# Patient Record
Sex: Female | Born: 1935 | Race: Asian | Hispanic: No | Smoking: Never smoker
Health system: Southern US, Community
[De-identification: ages and names within clinical notes are randomized; demographics above are authoritative.]

## PROBLEM LIST (undated history)

## (undated) DIAGNOSIS — I10 Essential (primary) hypertension: Secondary | ICD-10-CM

## (undated) DIAGNOSIS — G5600 Carpal tunnel syndrome, unspecified upper limb: Secondary | ICD-10-CM

## (undated) DIAGNOSIS — I509 Heart failure, unspecified: Secondary | ICD-10-CM

## (undated) DIAGNOSIS — I739 Peripheral vascular disease, unspecified: Secondary | ICD-10-CM

## (undated) DIAGNOSIS — F32A Depression, unspecified: Secondary | ICD-10-CM

## (undated) DIAGNOSIS — D649 Anemia, unspecified: Secondary | ICD-10-CM

## (undated) DIAGNOSIS — M1990 Unspecified osteoarthritis, unspecified site: Secondary | ICD-10-CM

## (undated) HISTORY — PX: ABDOMINAL HYSTERECTOMY: SHX81

## (undated) HISTORY — PX: TONSILECTOMY, ADENOIDECTOMY, BILATERAL MYRINGOTOMY AND TUBES: SHX2538

## (undated) HISTORY — DX: Carpal tunnel syndrome, unspecified upper limb: G56.00

## (undated) HISTORY — PX: HAND SURGERY: SHX662

## (undated) HISTORY — DX: Essential (primary) hypertension: I10

---

## 2009-07-24 ENCOUNTER — Emergency Department (HOSPITAL_COMMUNITY): Admission: EM | Admit: 2009-07-24 | Discharge: 2009-07-24 | Payer: Self-pay | Admitting: Emergency Medicine

## 2009-12-02 ENCOUNTER — Emergency Department (HOSPITAL_COMMUNITY)
Admission: EM | Admit: 2009-12-02 | Discharge: 2009-12-03 | Payer: Self-pay | Source: Home / Self Care | Admitting: Emergency Medicine

## 2010-04-01 LAB — DIFFERENTIAL
Eosinophils Absolute: 0.4 10*3/uL (ref 0.0–0.7)
Eosinophils Relative: 4 % (ref 0–5)
Lymphs Abs: 2.3 10*3/uL (ref 0.7–4.0)
Monocytes Absolute: 1.4 10*3/uL — ABNORMAL HIGH (ref 0.1–1.0)
Neutrophils Relative %: 60 % (ref 43–77)

## 2010-04-01 LAB — CBC
MCH: 30.6 pg (ref 26.0–34.0)
MCHC: 33.6 g/dL (ref 30.0–36.0)
MCV: 91.1 fL (ref 78.0–100.0)
Platelets: 306 10*3/uL (ref 150–400)

## 2010-04-01 LAB — SEDIMENTATION RATE: Sed Rate: 61 mm/hr — ABNORMAL HIGH (ref 0–22)

## 2010-04-06 LAB — POCT I-STAT, CHEM 8
Creatinine, Ser: 0.7 mg/dL (ref 0.4–1.2)
Glucose, Bld: 95 mg/dL (ref 70–99)
HCT: 47 % — ABNORMAL HIGH (ref 36.0–46.0)
Hemoglobin: 16 g/dL — ABNORMAL HIGH (ref 12.0–15.0)
TCO2: 32 mmol/L (ref 0–100)

## 2010-04-06 LAB — POCT URINALYSIS DIP (DEVICE)
Glucose, UA: NEGATIVE mg/dL
Nitrite: NEGATIVE
Protein, ur: NEGATIVE mg/dL
Specific Gravity, Urine: 1.015 (ref 1.005–1.030)
Urobilinogen, UA: 0.2 mg/dL (ref 0.0–1.0)

## 2010-04-06 LAB — CBC
Hemoglobin: 14.5 g/dL (ref 12.0–15.0)
MCH: 31.2 pg (ref 26.0–34.0)
MCHC: 32.9 g/dL (ref 30.0–36.0)
MCV: 94.7 fL (ref 78.0–100.0)
Platelets: 215 10*3/uL (ref 150–400)
RBC: 4.64 MIL/uL (ref 3.87–5.11)

## 2012-02-26 ENCOUNTER — Other Ambulatory Visit: Payer: Self-pay | Admitting: *Deleted

## 2012-06-10 ENCOUNTER — Other Ambulatory Visit: Payer: Self-pay | Admitting: Otolaryngology

## 2012-06-10 DIAGNOSIS — H905 Unspecified sensorineural hearing loss: Secondary | ICD-10-CM

## 2012-06-10 DIAGNOSIS — H9319 Tinnitus, unspecified ear: Secondary | ICD-10-CM

## 2012-06-21 ENCOUNTER — Ambulatory Visit (INDEPENDENT_AMBULATORY_CARE_PROVIDER_SITE_OTHER): Payer: BC Managed Care – PPO | Admitting: Sports Medicine

## 2012-06-21 ENCOUNTER — Encounter: Payer: Self-pay | Admitting: Sports Medicine

## 2012-06-21 VITALS — BP 139/77 | HR 85 | Ht 61.0 in | Wt 114.0 lb

## 2012-06-21 DIAGNOSIS — M25561 Pain in right knee: Secondary | ICD-10-CM

## 2012-06-21 DIAGNOSIS — M170 Bilateral primary osteoarthritis of knee: Secondary | ICD-10-CM | POA: Insufficient documentation

## 2012-06-21 DIAGNOSIS — M1710 Unilateral primary osteoarthritis, unspecified knee: Secondary | ICD-10-CM

## 2012-06-21 DIAGNOSIS — M1711 Unilateral primary osteoarthritis, right knee: Secondary | ICD-10-CM

## 2012-06-21 DIAGNOSIS — M25569 Pain in unspecified knee: Secondary | ICD-10-CM

## 2012-06-21 DIAGNOSIS — M159 Polyosteoarthritis, unspecified: Secondary | ICD-10-CM

## 2012-06-21 NOTE — Assessment & Plan Note (Signed)
We used a heel wedge in her shoes  She has advanced bunions and midfoot arthritis on the right I would like to see her back in a month and see if we can give her better support in her shoes that she walks and exercises in

## 2012-06-21 NOTE — Assessment & Plan Note (Signed)
May need to do more evaluation for systemic arthritis with the significant changes in her hands

## 2012-06-21 NOTE — Assessment & Plan Note (Signed)
We will try to use a compression sleeve over the right as that would seem to keep somewhat of a effusion  She has weakness of the hip abductors on the right and we will try to give her some easy strength exercises for the hip

## 2012-06-21 NOTE — Progress Notes (Signed)
Patient ID: Kristen Pham, female   DOB: 1924-04-24, 79 y.o.   MRN: [id number]  Pleasant 79 year old lady comes for evaluation of right knee pain along with other arthritic complaints. She has a history of gout and takes: Colcrys but no recent flares. She uses glucosamine chondroitin hip with arthritis pain She does not recall a specific injury  The pain has been worse over the past 2 years but does respond sometimes to Aleve and topical heat medications. She also uses a beanbag that she warms up and uses for heat over the knee. In 2011 before moving here she had an injection that helped for a short period of time and she also had x-rays done that showed significant arthritis. She has tried an elastic band around her knee and that helps somewhat.  She uses a pain to take pressure off the knee but does not really feel unstable while walking. She likes to stay active and at one time enjoyed playing tennis.  Physical examination No acute distress Generalized osteoarthritic changes in her hands at the [facility], MCP and DIP joints Significant medial spurring of the right knee Extension is -10 on the right and -3 on the left Flexion is 140 on the left but only 100 on the right Right knee is slightly warm and may have a very mild effusion Mild pain with rotation but no locking or clicking  walking shows a shift to the right and she probably has some scoliosis that contributes to this

## 2012-06-22 ENCOUNTER — Other Ambulatory Visit: Payer: Self-pay

## 2012-06-22 ENCOUNTER — Other Ambulatory Visit: Payer: Self-pay | Admitting: *Deleted

## 2012-06-22 MED ORDER — TRAMADOL HCL 50 MG PO TABS: 50.0000 mg | ORAL_TABLET | Freq: Two times a day (BID) | ORAL | Status: DC | PRN

## 2012-06-24 ENCOUNTER — Other Ambulatory Visit: Payer: Self-pay

## 2012-07-20 ENCOUNTER — Ambulatory Visit: Payer: BC Managed Care – PPO | Admitting: Sports Medicine

## 2012-08-02 ENCOUNTER — Ambulatory Visit: Payer: BC Managed Care – PPO | Admitting: Sports Medicine

## 2012-08-10 ENCOUNTER — Ambulatory Visit: Payer: BC Managed Care – PPO | Admitting: Sports Medicine

## 2012-08-23 ENCOUNTER — Ambulatory Visit (INDEPENDENT_AMBULATORY_CARE_PROVIDER_SITE_OTHER): Payer: BC Managed Care – PPO | Admitting: Sports Medicine

## 2012-08-23 VITALS — BP 124/60 | Ht 61.0 in | Wt 114.0 lb

## 2012-08-23 DIAGNOSIS — M25562 Pain in left knee: Secondary | ICD-10-CM

## 2012-08-23 DIAGNOSIS — M23302 Other meniscus derangements, unspecified lateral meniscus, unspecified knee: Secondary | ICD-10-CM

## 2012-08-23 DIAGNOSIS — M23006 Cystic meniscus, unspecified meniscus, right knee: Secondary | ICD-10-CM

## 2012-08-23 DIAGNOSIS — M23009 Cystic meniscus, unspecified meniscus, unspecified knee: Secondary | ICD-10-CM | POA: Insufficient documentation

## 2012-08-23 DIAGNOSIS — M25569 Pain in unspecified knee: Secondary | ICD-10-CM

## 2012-08-23 MED ORDER — METHYLPREDNISOLONE ACETATE 40 MG/ML IJ SUSP: 40.0000 mg | Freq: Once | INTRAMUSCULAR | Status: DC

## 2012-08-23 NOTE — Assessment & Plan Note (Signed)
Evidence of significant arthritic changes on ultrasound. Continue tramadol as this seems to be the most helpful for her.

## 2012-08-23 NOTE — Assessment & Plan Note (Addendum)
Ultrasound evidence of a medial parameniscal cyst tracking superiorly. The patient was provided compression Ace wrap following the injection.  She is to leave this in place for 4 hours. She's use intermittent compression as needed  Ultrasound-guided injection of the medial parameniscal cyst performed today  The risks, benefits, and expected outcomes of the injection were reviewed and she wishes to undergo the above named procedure.  After an appropriate time out was taken, the R knee was prepped in a clean fashion.  Sterile ultrasound gel was applied and the para-meniscal cyst was visualized and directly injected under ultrasound guidance with a 25g syringe using 1cc of 1% plain Lidocaine and 1mg  of Kenalog 40mg /mL.  A bandaid was applied to the area.  This procedure was well tolerated and there were no complications.

## 2012-08-23 NOTE — Patient Instructions (Addendum)
It was nice to see you today.   Today we discussed: 1. Meniscal cyst, right We injected your knee today.  Continue to use compression for up to 4 hours at a time. Keep taking your tramadol.    Please plan to return to see [place] as needed.  If you need anything prior to seeing me please call the clinic.  Please Bring all medications with you to each appointment.

## 2012-08-23 NOTE — Progress Notes (Signed)
  [facility]  Patient name: Kristen Pham MRN [id number]  Date of birth: 1924/02/08  CC & HPI:  Kristen Pham is a 79 y.o. female who is returning to care.   Chief Complaint  Patient presents with  . Knee Pain    Bilateral, Right worse than left, R has been swelling  Description:  history of significant bilateral leg pain.  She describes it more as a stiffness.  It is significantly been worsening on the right past 1 month.    Onset/Duration:  chronic worsened over one month   Illicit ing Event/Injury:  No  Severity:  5 out of 10 today   Aggravating:  going up and down steps   Effective Therapies:  tramadol seems to help, she has been trying to use gentle compression it does seem to improve she has not used it recently   Inneffective Therapies:  unable to tolerate body helix leave it is too painful and unable to get on and off   RED FLAGS & ROS:  she's had no giving way, clicking, popping, locking, falls      ROS:  See HPI  Pertinent History Reviewed:  Medical & Surgical Hx:  Reviewed: Significant for remarkably healthy, history of hypertension and hyperlipidemia Medications: Reviewed & Updated - see associated section Social History: Reviewed -  reports that she has never smoked. She has never used smokeless tobacco.   Objective Findings:  Vitals: BP 124/60  Ht 5\' 1"  (1.549 m)  Wt 114 lb (51.71 kg)  BMI 21.55 kg/m2  PE: GENERAL:  elderly female.  Appears younger than age, in no discomfort; no respiratory distress  PSYCH:  alert and appropriate, good insight      NeuroMSK  right knee Exam: Appearance:  chronic osteoarthritic changes, no skin breakdown, no evidence of acute trauma   Palpation:  mild diffuse tenderness, slight effusion on the medial aspect of the knee , no significant ballottement of the patella, significant patellar grind (present bilaterally)  ROM: Active Passive  Right leg limited and extension to -10   right leg limited to -10 , left leg  is neutral     Neurovascular:   pulses are 1+ out of 4 in the bilateral distal extremities  Lower extremity myotomes are 5+/5   MSK Testing:  no significant focal joint line tenderness ,      MSK ultrasound   right knee: Evidence of a hypoechoic fluid collection on the medial aspect of the right knee that is tracking superiorly from the medial meniscus which is significantly calcified and appears degenerated.  There is no appreciable intra-articular effusion.  There is significant osteoarthritic spurring along the medial and lateral joint line.  Lateral meniscus is also calcified and appears degenerated however no effusion or cyst noted. Patellar tendon is intact     Assessment & Plan:  See problem associated charting.  HPI Physical Exam

## 2012-09-09 ENCOUNTER — Other Ambulatory Visit: Payer: Self-pay | Admitting: Sports Medicine

## 2012-09-13 ENCOUNTER — Telehealth: Payer: Self-pay | Admitting: *Deleted

## 2012-09-13 MED ORDER — TRAMADOL HCL 50 MG PO TABS: 50.0000 mg | ORAL_TABLET | Freq: Two times a day (BID) | ORAL | Status: DC | PRN

## 2012-09-13 NOTE — Telephone Encounter (Signed)
Message copied by Mora Bellman on Tue Sep 13, 2012  4:36 PM ------      Message from: Lizbeth Bark      Created: Tue Sep 13, 2012  3:59 PM      Regarding: phone message      Contact: [phone]       Pt called would like you to fax tramadol prescription to [facility]  fax [phone] ------

## 2012-09-27 ENCOUNTER — Ambulatory Visit: Payer: BC Managed Care – PPO | Admitting: Sports Medicine

## 2012-11-01 ENCOUNTER — Ambulatory Visit: Payer: BC Managed Care – PPO | Admitting: Sports Medicine

## 2012-11-24 ENCOUNTER — Encounter: Payer: Self-pay | Admitting: Podiatry

## 2012-11-24 ENCOUNTER — Ambulatory Visit (INDEPENDENT_AMBULATORY_CARE_PROVIDER_SITE_OTHER): Payer: Medicare Other | Admitting: Podiatry

## 2012-11-24 ENCOUNTER — Ambulatory Visit (INDEPENDENT_AMBULATORY_CARE_PROVIDER_SITE_OTHER): Payer: Medicare Other

## 2012-11-24 VITALS — BP 136/64 | HR 86 | Resp 12 | Ht 62.0 in | Wt 106.0 lb

## 2012-11-24 DIAGNOSIS — M2010 Hallux valgus (acquired), unspecified foot: Secondary | ICD-10-CM

## 2012-11-24 DIAGNOSIS — L84 Corns and callosities: Secondary | ICD-10-CM

## 2012-11-24 DIAGNOSIS — R52 Pain, unspecified: Secondary | ICD-10-CM

## 2012-11-24 DIAGNOSIS — M2040 Other hammer toe(s) (acquired), unspecified foot: Secondary | ICD-10-CM

## 2012-11-24 NOTE — Progress Notes (Signed)
Subjective:     Patient ID: Kristen Pham, female   DOB: [birth year], 80 y.o.   MRN: [id number]  HPI patient presents stating I have is very painful corn between the second and third toes on my left foot. States it has really been bothersome and making shoe gear difficult   Review of Systems  All other systems reviewed and are negative.       Objective:   Physical Exam  Nursing note and vitals reviewed. Constitutional: She is oriented to person, place, and time.  Cardiovascular: Intact distal pulses.   Musculoskeletal: Normal range of motion.  Neurological: She is oriented to person, place, and time.  Skin: Skin is warm.   patient's left second toe and third toe have a kissing corns with keratotic tissue formation mostly on the second toe. Localized to this area neurovascular status was found to be intact and muscle strength adequate for her a     Assessment:     2 bones pressing creating keratotic lesion right second toe was structural bunion deformity and hammertoe deformity as part of the problem    Plan:     H&P and x-ray reviewed with patient. Today I did deep debridement of the lesion and applied padding to take pressure off of it. Discussed the possibility for surgery if symptoms do not improve at this time or possible injection

## 2013-03-23 ENCOUNTER — Other Ambulatory Visit: Payer: Self-pay | Admitting: *Deleted

## 2013-03-23 MED ORDER — TRAMADOL HCL 50 MG PO TABS: 50.0000 mg | ORAL_TABLET | Freq: Two times a day (BID) | ORAL | Status: DC | PRN

## 2013-03-23 NOTE — Progress Notes (Signed)
Refilled per Dr. Fields  

## 2013-08-01 ENCOUNTER — Other Ambulatory Visit: Payer: Self-pay | Admitting: Otolaryngology

## 2013-08-01 DIAGNOSIS — H9319 Tinnitus, unspecified ear: Secondary | ICD-10-CM

## 2013-08-01 DIAGNOSIS — H905 Unspecified sensorineural hearing loss: Secondary | ICD-10-CM

## 2013-08-10 ENCOUNTER — Ambulatory Visit
Admission: RE | Admit: 2013-08-10 | Discharge: 2013-08-10 | Disposition: A | Discharge status: Home / Self Care | Payer: Medicare Other | Source: Ambulatory Visit | Attending: Otolaryngology | Admitting: Otolaryngology

## 2013-08-10 DIAGNOSIS — H905 Unspecified sensorineural hearing loss: Secondary | ICD-10-CM

## 2013-08-10 DIAGNOSIS — H9319 Tinnitus, unspecified ear: Secondary | ICD-10-CM

## 2013-08-10 MED ORDER — GADOBENATE DIMEGLUMINE 529 MG/ML IV SOLN
10.0000 mL | Freq: Once | INTRAVENOUS | Status: AC | PRN
  Administered 2013-08-10: 10 mL via INTRAVENOUS

## 2014-01-31 ENCOUNTER — Telehealth: Payer: Self-pay | Admitting: *Deleted

## 2014-01-31 NOTE — Telephone Encounter (Signed)
Sent refill thru fax form to [facility]

## 2014-01-31 NOTE — Telephone Encounter (Signed)
-----   Message from Lizbeth BarkMelanie L Ceresi sent at 01/30/2014  9:11 AM EST ----- Regarding: FW: tramadol refill per patient Contact: [phone] Pt left message today saying she is out of her medicine and needs it sent to pharmacy today. ----- Message -----    From: Lizbeth BarkMelanie L Ceresi    Sent: 01/29/2014  10:55 AM      To: Linward Headlandhea N Shemar Plemmons, RN Subject: tramadol refill per patient                    Pt states this is her new pharmacy not sure of name its from [facility] phone number is [phone]

## 2014-02-07 ENCOUNTER — Ambulatory Visit (INDEPENDENT_AMBULATORY_CARE_PROVIDER_SITE_OTHER): Payer: Medicare Other | Admitting: Sports Medicine

## 2014-02-07 ENCOUNTER — Encounter: Payer: Self-pay | Admitting: Sports Medicine

## 2014-02-07 VITALS — BP 123/73 | Ht 61.0 in | Wt 116.0 lb

## 2014-02-07 DIAGNOSIS — M25562 Pain in left knee: Secondary | ICD-10-CM

## 2014-02-07 DIAGNOSIS — M170 Bilateral primary osteoarthritis of knee: Secondary | ICD-10-CM

## 2014-02-07 DIAGNOSIS — M25561 Pain in right knee: Secondary | ICD-10-CM

## 2014-02-07 MED ORDER — TRAMADOL HCL 50 MG PO TABS: ORAL_TABLET | ORAL | Status: DC

## 2014-02-07 NOTE — Assessment & Plan Note (Signed)
Excellent response to tramadol - RF given to last 1 year Cont this twice daily  Cont to do some isometric and SLR exercises  Use compression for brief periods when swelling occurs  Reck for any problems and can repeat CSI when needed

## 2014-02-07 NOTE — Progress Notes (Signed)
   Subjective:    Patient ID: Kristen Pham, female    DOB: 06/11/1924, 81 y.o.   MRN: [id number]  HPI Kristen Pham is an [age] year old female with PMH of osteoarthritis who presents today for follow up on the osteoarthritis in her bilateral knees (R worse than L) and for prescription refill. She was last seen in 2014. At that time, she was given a body helix compression sleeve, a prescription for tramadol and later a therapeutic R cortisone injection. She stopped using the body helix compression sleeve because it was too tight causing her discomfort. She ordered a different knee sleeve that she wears occasionally. She continues to use tramadol as needed and she reports this greatly reduces her knee pain. The cortisone injection helped for a few months with the pain. She remains active - walking and stretching daily. She wants to avoid knee replacement surgery if possible.   Her primary pain continues with going up and down steps.  She uses 2 feet to go up on RT knee.   Review of Systems     Objective:   Physical Exam Well nourished. Well developed. No acute distres. Alert and well oriented;  Good memory BP 123/73 mmHg  Ht 5\' 1"  (1.549 m)  Wt 116 lb (52.617 kg)  BMI 21.93 kg/m2   Right knee: No effusion noted. Complex medial spurring present along joint line Non-tender to palpation. Crepitus felt with knee flexion. Few degrees reduced flexion on R knee compared to left. No pain with knee rotation. 5/5 knee flexion and knee extension strength. Overall motion preserved in functional range.  Left knee: No effusion noted. Complex medial joint linespurring noted but less than RT. Non-tender to palpation. Crepitus present with knee flexion. Few degrees reduced extension on L knee compared to right. No pain with knee rotation. 5/5 knee flexion and knee extension strength.   Walking gait shows mild varus on RT but good knee motion    Assessment & Plan:   Note written originally by Ave FilterJosh  Pham, MS4 [facility]. Note reviewed/edited by Dr. Enid BaasKarl Detron Pham.

## 2014-02-07 NOTE — Assessment & Plan Note (Signed)
Osteoarthritis in bilateral knees (R > L).   Plan:  1) Continue tramadol BID as needed for pain.  2) Wear compression sleeve with activity or long periods of standing.   Follow up as needed. Advised patient to return if she desired another cortisone injection since she had relief last time with the injection.

## 2014-03-01 ENCOUNTER — Other Ambulatory Visit: Payer: Self-pay | Admitting: *Deleted

## 2014-03-01 MED ORDER — TRAMADOL HCL 50 MG PO TABS: ORAL_TABLET | ORAL | Status: DC

## 2014-05-15 ENCOUNTER — Encounter: Payer: Self-pay | Admitting: Diagnostic Neuroimaging

## 2014-05-15 ENCOUNTER — Ambulatory Visit (INDEPENDENT_AMBULATORY_CARE_PROVIDER_SITE_OTHER): Payer: Medicare Other | Admitting: Diagnostic Neuroimaging

## 2014-05-15 VITALS — BP 135/68 | HR 79 | Ht 61.0 in | Wt 113.8 lb

## 2014-05-15 DIAGNOSIS — R208 Other disturbances of skin sensation: Secondary | ICD-10-CM

## 2014-05-15 DIAGNOSIS — M25562 Pain in left knee: Secondary | ICD-10-CM | POA: Diagnosis not present

## 2014-05-15 DIAGNOSIS — R200 Anesthesia of skin: Secondary | ICD-10-CM

## 2014-05-15 DIAGNOSIS — M5442 Lumbago with sciatica, left side: Secondary | ICD-10-CM

## 2014-05-15 DIAGNOSIS — M25561 Pain in right knee: Secondary | ICD-10-CM | POA: Diagnosis not present

## 2014-05-15 NOTE — Patient Instructions (Signed)
I will set up physical therapy.  I will check additional blood testing.

## 2014-05-15 NOTE — Progress Notes (Signed)
[facility]  PATIENT: Kristen Pham DOB: Jan 01, 1925  REFERRING CLINICIAN: Ewell Poe HISTORY FROM: patient  REASON FOR VISIT: new consult    HISTORICAL  CHIEF COMPLAINT:  Chief Complaint  Patient presents with  . New Evaluation    numbness in both legs     HISTORY OF PRESENT ILLNESS:   81 year old right-handed female here for evaluation of lower extremity numbness since 2011. Left leg more affected than the right leg. She has history of sciatic nerve pain and low back from 1981 which resolved with conservative management. Patient also has history of right Bell's palsy many years ago. She has chronic low back pain. Current numbness in her legs is not painful. She has some mild weakness. Pain radiates from her lower back, buttock, thigh, into her calf. No problems with her arms or hands. No neck pain. No facial numbness or weakness.   REVIEW OF SYSTEMS: Full 14 system review of systems performed and notable only for cramps joint swelling joint pain constipation eye pain palpitation hearing loss itching.  ALLERGIES: Allergies  Allergen Reactions  . Ivp Dye [Iodinated Diagnostic Agents]     Felt confused, disoriented    HOME MEDICATIONS: Outpatient Prescriptions Prior to Visit  Medication Sig Dispense Refill  . amLODipine (NORVASC) 10 MG tablet Take 10 mg by mouth daily.    Marland Kitchen atenolol (TENORMIN) 50 MG tablet Take 50 mg by mouth daily.    . fluocinonide cream (LIDEX) 0.05 % as needed.    Marland Kitchen glucosamine-chondroitin 500-400 MG tablet Take 1 tablet by mouth daily.    Marland Kitchen LIPITOR 20 MG tablet Take 20 mg by mouth at bedtime.    . traMADol (ULTRAM) 50 MG tablet Take one tablet twice a day as needed for pain 180 tablet 3  . COLCRYS 0.6 MG tablet Take 0.6 mg by mouth daily.     No facility-administered medications prior to visit.    PAST MEDICAL HISTORY: Past Medical History  Diagnosis Date  . Hypertension   . Carpal tunnel syndrome     PAST SURGICAL  HISTORY: Past Surgical History  Procedure Laterality Date  . Cesarean section    . Abdominal hysterectomy    . Hand surgery      bilateral release   . Tonsilectomy, adenoidectomy, bilateral myringotomy and tubes      FAMILY HISTORY: Family History  Problem Relation Age of Onset  . Cerebral aneurysm Father     SOCIAL HISTORY:  History   Social History  . Marital Status: Widowed    Spouse Name: N/A  . Number of Children: 1  . Years of Education: associates   Occupational History  . retired     Social History Main Topics  . Smoking status: Never Smoker   . Smokeless tobacco: Never Used  . Alcohol Use: 0.0 oz/week    0 Standard drinks or equivalent per week     Comment: occasionally   . Drug Use: No  . Sexual Activity: Not on file   Other Topics Concern  . Not on file   Social History Narrative   Lives at home alone   Drinks coke and coffee occasionally      PHYSICAL EXAM  Filed Vitals:   05/15/14 0855  BP: 135/68  Pulse: 79  Height:  (1.549 m)  Weight: 113 lb 12.8 oz (51.619 kg)    Body mass index is 21.51 kg/(m^2).   Visual Acuity Screening   Right eye Left eye Both eyes  Without correction:  20/40 20/40   With correction:       No flowsheet data found.  GENERAL EXAM: Patient is in no distress; well developed, nourished and groomed; neck is supple; APPEARS MUCH YOUNGER THAN STATED AGE  CARDIOVASCULAR: Regular rate and rhythm, no murmurs, no carotid bruits  NEUROLOGIC: MENTAL STATUS: awake, alert, oriented to person, place and time, recent and remote memory intact, normal attention and concentration, language fluent, comprehension intact, naming intact, fund of knowledge appropriate CRANIAL NERVE: no papilledema on fundoscopic exam, POST-SURGICAL PUPILS; MIN REACTION, visual fields full to confrontation, extraocular muscles intact, no nystagmus, facial sensation and strength symmetric, INT RIGHT HEMIFACIAL SPASM, hearing intact, palate  elevates symmetrically, uvula midline, shoulder shrug symmetric, tongue midline. MOTOR: normal bulk and tone, full strength in the BUE, BLE SENSORY: normal and symmetric to light touch, HYPERSENS PP IN LEFT GREAT TOE; VIBRATION (RIGHT TOE 4 SEC, LEFT TOE 6 SEC), pinprick, vibration COORDINATION: finger-nose-finger, fine finger movements normal REFLEXES: BUE 2, KNEES TRACE, ANKLES 0 GAIT/STATION: narrow based gait; able to walk on toes, heels and tandem; romberg is negative    DIAGNOSTIC DATA (LABS, IMAGING, TESTING) - I reviewed patient records, labs, notes, testing and imaging myself where available.  Lab Results  Component Value Date   WBC 10.6* 12/02/2009   HGB 12.4 12/02/2009   HCT 36.9 12/02/2009   MCV 91.1 12/02/2009   PLT 306 12/02/2009      Component Value Date/Time   NA 143 07/24/2009 2036   K 4.1 07/24/2009 2036   CL 105 07/24/2009 2036   GLUCOSE 95 07/24/2009 2036   BUN 14 07/24/2009 2036   CREATININE 0.7 07/24/2009 2036   No results found for: CHOL, HDL, LDLCALC, LDLDIRECT, TRIG, CHOLHDL No results found for: WUJW1XHGBA1C No results found for: VITAMINB12 No results found for: TSH   08/10/13 MRI BRAIN - Labyrinthine structures are symmetric and normal bilaterally. No internal auditory canal enhancing lesion. No abnormal vessels identified in the posterior fossa. Major intracranial vascular structures are patent. Right-sided dominant dural drainage without associated abnormality detected as cause of the patient's symptoms. Mild to moderate small vessel disease type changes. Opacification right maxillary sinus with central inspissated material. Minimal to mild mucosal thickening ethmoid sinus air cells and sphenoid sinus air cells.    ASSESSMENT AND PLAN  81 y.o. year old female here with lower extremity numbness since 2011. No pain or weakness.  Ddx: lumbar radiculopathy vs peripheral neuropathy (metabolic, autoimmune, idiopathic)  PLAN: - labs - PT eval -  conservative mgmt for now; may consider MRI lumbar spine and EMG/NCS is future  Orders Placed This Encounter  Procedures  . Vitamin B12  . TSH  . Hemoglobin A1c  . Ambulatory referral to Physical Therapy   Return in about 3 months (around 08/14/2014).    Suanne MarkerVIKRAM R. Sotero Brinkmeyer, MD 05/15/2014, 9:47 AM Certified in Neurology, Neurophysiology and Neuroimaging  [facility] [address], [address] [place], [place] [address] [phone]

## 2014-05-16 LAB — HEMOGLOBIN A1C
ESTIMATED AVERAGE GLUCOSE: 123 mg/dL
HEMOGLOBIN A1C: 5.9 % — AB (ref 4.8–5.6)

## 2014-05-16 LAB — TSH: TSH: 2.3 u[IU]/mL (ref 0.450–4.500)

## 2014-05-16 LAB — VITAMIN B12: VITAMIN B 12: 768 pg/mL (ref 211–946)

## 2014-07-02 ENCOUNTER — Encounter: Payer: Self-pay | Admitting: Physical Therapy

## 2014-07-02 ENCOUNTER — Ambulatory Visit: Payer: Medicare Other | Attending: Diagnostic Neuroimaging | Admitting: Physical Therapy

## 2014-07-02 DIAGNOSIS — R531 Weakness: Secondary | ICD-10-CM

## 2014-07-02 DIAGNOSIS — M5442 Lumbago with sciatica, left side: Secondary | ICD-10-CM | POA: Diagnosis not present

## 2014-07-02 DIAGNOSIS — R209 Unspecified disturbances of skin sensation: Secondary | ICD-10-CM | POA: Insufficient documentation

## 2014-07-02 NOTE — Patient Instructions (Signed)
On Elbows (Prone)   Rise up on elbows as high as possible, keeping hips on bed. Hold 5-10 seconds. Repeat 10 times per set. Do __2-3__ sessions per day and/or when you have increased numbness in legs.  http://orth.exer.us/93   Copyright  VHI. All rights reserved.

## 2014-07-03 NOTE — Therapy (Signed)
[facility] [address] [place], [place], [address] Phone: [phone]   Fax:  [phone]  Physical Therapy Evaluation  Patient Details  Name: Kristen Pham MRN: [id number] Date of Birth: 05-Apr-1924 Referring Provider:  Suanne Marker, MD  Encounter Date: 07/02/2014      PT End of Session - 07/02/14 1904    Visit Number 1   Number of Visits 17   Date for PT Re-Evaluation 08/31/14   Authorization Type Medicare - G Codes every 10 visits   PT Start Time [phone]   PT Stop Time 0935   PT Time Calculation (min) 42 min   Activity Tolerance Patient tolerated treatment well   Behavior During Therapy [facility] for tasks assessed/performed      Past Medical History  Diagnosis Date  . Hypertension   . Carpal tunnel syndrome     Past Surgical History  Procedure Laterality Date  . Cesarean section    . Abdominal hysterectomy    . Hand surgery      bilateral release   . Tonsilectomy, adenoidectomy, bilateral myringotomy and tubes      There were no vitals filed for this visit.  Visit Diagnosis:  Weakness generalized  Bilateral low back pain with left-sided sciatica  Abnormal sensation of lower extremity      Subjective Assessment - 07/02/14 0905    Subjective Pt reports numbess in LLE > RLE and pain in the lower back. L-sided sciatic nerve pain resolved in 1980 but recently more bothersome. LLE numbness increases with lying down, walking and is mitigated with sitting down, resting, and elevating LLE. Lower back pain is mitigated with use of Tramadol.  Pt's preferred exercise is walking; however, walking distance has been limited by LLE numbness.   Limitations Walking   Currently in Pain? No/denies             PT Short Term Goals - 07/02/14 1905    PT SHORT TERM GOAL #1   Title Pt will perform home exercises independently to maximize functional gains made in physical therapy. Target: 07/30/14.   Time 4   Period Weeks   Status New   PT SHORT TERM GOAL #2   Title Pt will ambulate at gait speed of 2.89 ft/sec to indicate pt safety with community ambulation. Target: 07/30/14.   Time 4   Period Weeks   Status New   PT SHORT TERM GOAL #3   Title Pt will complete paper Modified Oswestry Disability Index to assess impact of symptoms on quality of life. 07/30/14.   Time 4   Period Weeks   Status New           PT Long Term Goals - 07/02/14 1916    PT LONG TERM GOAL #1   Title Pt will increase gait speed from 2.59 ft/sec to 3.19 ft/sec to indicate improved efficiency of ambulation. Target: 08/27/14.   Time 8   Period Weeks   Status New   PT LONG TERM GOAL #2   Title Pt will decrease Modified Oswestry score by 6 points from baseline to indicate significant decrease in symptom-related quality of life. Target: 08/27/14.   Time 8   Period Weeks   Status New   PT LONG TERM GOAL #3   Title Pt will ambulate 1,000' over unlevel, outdoor surfaces without LOB or need for rest break due to pain/symptoms in lower back or LE's to indicate progress toward PLOF. Target: 08/27/14.   Time 8  Period Weeks   Status New   PT LONG TERM GOAL #4   Title Pt will demonstrate 2 techniques used to mitigate LE numbness to indicate pt ability to self-manage symptoms. Target: 08/27/14.   Time 8   Period Weeks   Status New   PT LONG TERM GOAL #5   Title Pt will report 2-point decrease in within-session pain rating to facilitate improved quality of life. Target: 08/27/14.   Time 8   Period Weeks   Status New               Plan - 07/15/14 1933    Clinical Impression Statement Pt is an 81 y/o F presenting to outpatient PT with B LE numbness (severity on L > R), gait impairments, lower back pain, and LE weakness associated with sciatica. Pt reports that pain/symptoms are limiting walking distance and activity tolerance. On PT evaluation, pt demonstrates the following impairments: gait speed indicative of status as  limited community ambulator;  B LE weakness, diminished sensation on LLE. On evaluation, LE symptoms did respond favorably to thoracolumbar extension. Pt will benefit from skilled PT twice per week for up to 8 weeks to address said impairments, improve quality of life, and facilitate pt return to PLOF.   Pt will benefit from skilled therapeutic intervention in order to improve on the following deficits Abnormal gait;Decreased activity tolerance;Difficulty walking;Impaired sensation;Decreased coordination;Pain;Postural dysfunction;Decreased strength;Improper body mechanics   Rehab Potential Good   Clinical Impairments Affecting Rehab Potential Pt reports having had sciatica since 1980.   PT Frequency 2x / week   PT Duration 8 weeks   PT Treatment/Interventions ADLs/Self Care Home Management;Functional mobility training;Therapeutic activities;Therapeutic exercise;Balance training;Neuromuscular re-education;Patient/family education   PT Next Visit Plan Administer Modified Oswestry. Initiate HEP with focus on extension-based exercises, LE strengthening.   PT Home Exercise Plan Extension-based exercise; add standing thoracolumbar extension exercise. Assess if improvement with rotation and extension and add to HEP, if appropriate.   Recommended Other Services None at this time.   Consulted and Agree with Plan of Care Patient          G-Codes - 2014-07-15 1858    Functional Assessment Tool Used Gait speed 2.59 ft/sec   Functional Limitation Mobility: Walking and moving around   Mobility: Walking and Moving Around Current Status [phone]) At least 1 percent but less than 20 percent impaired, limited or restricted   Mobility: Walking and Moving Around Goal Status [phone]) At least 1 percent but less than 20 percent impaired, limited or restricted       Problem List Patient Active Problem List   Diagnosis Date Noted  . 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 Knee pain, bilateral 06/21/2012  . Primary  osteoarthritis of both knees 06/21/2012  . Generalized osteoarthritis of hand 06/21/2012    Jorje Guild, PT, DPT [facility] [address] [place], [place], [address] Phone: [phone]   Fax:  [phone] 07/03/2014, 8:12 AM

## 2014-07-11 ENCOUNTER — Ambulatory Visit: Payer: Medicare Other | Admitting: Physical Therapy

## 2014-07-11 DIAGNOSIS — R209 Unspecified disturbances of skin sensation: Secondary | ICD-10-CM

## 2014-07-11 DIAGNOSIS — R531 Weakness: Secondary | ICD-10-CM

## 2014-07-11 DIAGNOSIS — M5442 Lumbago with sciatica, left side: Secondary | ICD-10-CM

## 2014-07-11 NOTE — Patient Instructions (Addendum)
Hip Extension: Two Feet   Stand facing a wall; place both hands on wall. Bend backward and push hips forward toward wall. Keep spine neutral, do not arch back. Hold _5-10__ seconds. Do _10__ times or until leg numbness subsides. You can use this to address left leg numbness when you're unable to lie down (when you aren't at home).  Stabilization: Transverse Abdominus Contraction - Supine   Lie with knees bent, feet flat. Place fingers on hip pointer bones; move hands inward (toward belly button) 2 finger widths, then doward 3 finger widths. Contract abdominals, pulling naval toward spine (as though you're putting on a tight pair of pants).  You should feel this muscle contract under your finger tips. Hold for 5 seconds. Relax. Repeat for a total 10 repetitions, 2-3 times per day.   Heel Raises   Stand at countertop with one hand to support. Make sure you contract your core muscles (like we talked about this therapy). With knees straight, raise heels off ground. Hold _2__ seconds. Relax. Repeat 15 times. Do this 3 times per day.  Copyright  VHI. All rights reserved.

## 2014-07-11 NOTE — Therapy (Signed)
[facility] [address] [place], [place], [address] Phone: [phone]   Fax:  [phone]  Physical Therapy Treatment  Patient Details  Name: Kristen Pham MRN: [id number] Date of Birth: 10/12/1924 Referring Provider:  Creola Corn, MD  Encounter Date: 07/11/2014      PT End of Session - 07/11/14 1324    Visit Number 2   Number of Visits 17   Date for PT Re-Evaluation 08/31/14   Authorization Type Medicare - G Codes every 10 visits   PT Start Time 0848   PT Stop Time 0930   PT Time Calculation (min) 42 min   Activity Tolerance Patient tolerated treatment well;No increased pain   Behavior During Therapy [facility] for tasks assessed/performed      Past Medical History  Diagnosis Date  . Hypertension   . Carpal tunnel syndrome     Past Surgical History  Procedure Laterality Date  . Cesarean section    . Abdominal hysterectomy    . Hand surgery      bilateral release   . Tonsilectomy, adenoidectomy, bilateral myringotomy and tubes      There were no vitals filed for this visit.  Visit Diagnosis:  Bilateral low back pain with left-sided sciatica  Weakness generalized  Abnormal sensation of lower extremity      Subjective Assessment - 07/11/14 0854    Subjective Pt has utilize prone on elbows (spinal extension) to mitigate LLE numbness, when needed. Pt does report difficulty self-addressing numbness when not at home.    Limitations Walking   Currently in Pain? Yes   Pain Score 5   0/10 numbness post-session today   Pain Location Foot   Pain Orientation Left;Anterior;Posterior   Pain Descriptors / Indicators Numbness   Pain Type Chronic pain   Pain Radiating Towards Foot   Multiple Pain Sites No      Treatment   Therapeutic Exercises: - Prone thoracolumbar extension x10 reps (x10-second holds) with cueing for UE positioning. Pt noted decrease in LE symptom severity following exercise. - Standing  with BUE support at wall, pt performed B hip extension by moving B hips toward wall. Pt performed 10 reps x10-second holds each. Added to HEP. - Supine, hook lying: instructed pt in transverse abdominus (TA) activation with verbal/tacgtile cueing for proper technique with effective within-session carryover. Instructed pt in palpation of TA to ensure proper technique when performing HEP. - Pt attempted toe walking with single UE support at countertop; however, activity not continued secondary to significant difficulty. - Instructed pt in B heel raises for plantarflexor strengthening 2 x15 reps with single UE support; added to HEP.  Therapeutic Activities: See Patient Education section for details.                           PT Education - 07/11/14 5009    Education provided Yes   Education Details HEP progressed; see pt instructions. Discussed role of transverse abdominus in spinal stabilization and impact on pain, symptoms. How to palpate to assess if activating muscle.   Person(s) Educated Patient   Methods Demonstration;Explanation;Handout;Verbal cues;Tactile cues   Comprehension Verbalized understanding;Returned demonstration          PT Short Term Goals - 07/11/14 1329    PT SHORT TERM GOAL #1   Title Pt will perform home exercises independently to maximize functional gains made in physical therapy. Target: 07/30/14.   Time 4   Period Weeks  Status On-going   PT SHORT TERM GOAL #2   Title Pt will ambulate at gait speed of 2.89 ft/sec to indicate pt safety with community ambulation. Target: 07/30/14.   Time 4   Period Weeks   Status On-going   PT SHORT TERM GOAL #3   Title Pt will complete paper Modified Oswestry Disability Index to assess impact of symptoms on quality of life. 07/30/14.   Time 4   Period Weeks   Status On-going           PT Long Term Goals - 07/11/14 1329    PT LONG TERM GOAL #1   Title Pt will increase gait speed from 2.59 ft/sec to  3.19 ft/sec to indicate improved efficiency of ambulation. Target: 08/27/14.   Time 8   Period Weeks   Status On-going   PT LONG TERM GOAL #2   Title Pt will decrease Modified Oswestry score by 6 points from baseline to indicate significant decrease in symptom-related quality of life. Target: 08/27/14.   Time 8   Period Weeks   Status On-going   PT LONG TERM GOAL #3   Title Pt will ambulate 1,000' over unlevel, outdoor surfaces without LOB or need for rest break due to pain/symptoms in lower back or LE's to indicate progress toward PLOF. Target: 08/27/14.   Time 8   Period Weeks   Status On-going   PT LONG TERM GOAL #4   Title Pt will demonstrate 2 techniques used to mitigate LE numbness to indicate pt ability to self-manage symptoms. Target: 08/27/14.   Time 8   Period Weeks   Status On-going   PT LONG TERM GOAL #5   Title Pt will report 2-point decrease in within-session pain rating to facilitate improved quality of life. Target: 08/27/14.   Time 8   Period Weeks   Status On-going               Plan - 07/11/14 1325    Clinical Impression Statement Session focused on mitigation, centralization of LLE symptoms. Severity of LLE numbness decreased from 5/10 to 0/10 within this session with extension-based exercise and core stabilization. Continue per POC.   Pt will benefit from skilled therapeutic intervention in order to improve on the following deficits Abnormal gait;Decreased activity tolerance;Difficulty walking;Impaired sensation;Decreased coordination;Pain;Postural dysfunction;Decreased strength;Improper body mechanics   Rehab Potential Good   Clinical Impairments Affecting Rehab Potential Pt reports having had sciatica since 1980.   PT Frequency 2x / week   PT Duration 8 weeks   PT Treatment/Interventions ADLs/Self Care Home Management;Functional mobility training;Therapeutic activities;Therapeutic exercise;Balance training;Neuromuscular re-education;Patient/family education   PT  Next Visit Plan Administer Modified Oswestry. Assess TA activation. Continue to progress core stabilization exercises. Consider adding lumbar multifidi strengthening.   PT Home Exercise Plan Progress core stabilization program.   Consulted and Agree with Plan of Care Patient        Problem List Patient Active Problem List   Diagnosis Date Noted  . 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 Knee pain, bilateral 06/21/2012  . Primary osteoarthritis of both knees 06/21/2012  . Generalized osteoarthritis of hand 06/21/2012    Jorje Guild, PT, DPT [facility] [address] [place], [place], [address] Phone: [phone]   Fax:  [phone] 07/11/2014, 1:37 PM

## 2014-07-13 ENCOUNTER — Ambulatory Visit: Payer: Medicare Other | Admitting: Physical Therapy

## 2014-07-13 DIAGNOSIS — R209 Unspecified disturbances of skin sensation: Secondary | ICD-10-CM

## 2014-07-13 DIAGNOSIS — M5442 Lumbago with sciatica, left side: Secondary | ICD-10-CM

## 2014-07-13 DIAGNOSIS — R531 Weakness: Secondary | ICD-10-CM

## 2014-07-13 NOTE — Therapy (Signed)
[facility] [address] [place], [place], [address] Phone: [phone]   Fax:  [phone]  Physical Therapy Treatment  Patient Details  Name: Kristen Pham MRN: [id number] Date of Birth: 20-Apr-1924 Referring Provider:  Creola Corn, MD  Encounter Date: 07/13/2014      PT End of Session - 07/13/14 1651    Visit Number 3   Number of Visits 17   Date for PT Re-Evaluation 08/31/14   Authorization Type Medicare - G Codes every 10 visits   PT Start Time 0804  Pt arrived late to session   PT Stop Time 0846   PT Time Calculation (min) 42 min   Activity Tolerance Patient tolerated treatment well;No increased pain   Behavior During Therapy [facility] for tasks assessed/performed      Past Medical History  Diagnosis Date  . Hypertension   . Carpal tunnel syndrome     Past Surgical History  Procedure Laterality Date  . Cesarean section    . Abdominal hysterectomy    . Hand surgery      bilateral release   . Tonsilectomy, adenoidectomy, bilateral myringotomy and tubes      There were no vitals filed for this visit.  Visit Diagnosis:  Bilateral low back pain with left-sided sciatica  Weakness generalized  Abnormal sensation of lower extremity      Subjective Assessment - 07/13/14 0828    Subjective " I feel like this is really helping me," per pt. Pt unsure as to whether or not she's performing HEP correctly and would like to review transverse abdominus activation. Pt also reporting increased soreness in B calves.   Limitations Walking   Currently in Pain? Yes   Pain Score 5    Pain Location Foot   Pain Orientation Left;Anterior   Pain Descriptors / Indicators Tingling   Pain Type Chronic pain   Pain Onset More than a month ago   Pain Frequency Intermittent   Aggravating Factors  walking   Pain Relieving Factors spinal extension          Treatment   Therapeutic Exercises: - Prone on elbows,  thoracolumbar extension x10 reps (x10-second holds) with cueing for UE positioning. LLE tingling decreased in severity from 5/10 to 0/10 following exercise. - Supine, hook lying: instructed pt in correct hand placement to effectively palpate transverse abdominus (TA) muscle to ensure proper technique when performing HEP. Pt demonstrated effective within-session carryover during remainder of spinal stabilization exercises. - Progressed from supine, hook lying TA activation (15 reps x5-second holds) to TA activation with concurrent alternating B LE elevation 5 x5 consecutive reps to simulate core muscle activation during gait. - Standing with BUE support at countertop, pt performed B heel raises for plantarflexor strengthening 2 x15 reps with 2-second holds; cueing required for duration of hold. - Due to pt-reported soreness in B calves (see subjective), instructed pt in B gastrocnemius then soleus stretches 2 x60-second holds each.   Therapeutic Activities: See Patient Education section for details.                        PT Education - 07/13/14 [phone]    Education provided Yes   Education Details Added gastroc/soleus stretch to HEP. Logroll technique for supine <> sit.   Person(s) Educated Patient   Methods Explanation;Demonstration;Handout   Comprehension Verbalized understanding;Returned demonstration          PT Short Term Goals - 07/13/14 1651    PT  SHORT TERM GOAL #1   Title Pt will perform home exercises independently to maximize functional gains made in physical therapy. Target: 07/30/14.   Time 4   Period Weeks   Status On-going   PT SHORT TERM GOAL #2   Title Pt will ambulate at gait speed of 2.89 ft/sec to indicate pt safety with community ambulation. Target: 07/30/14.   Time 4   Period Weeks   Status On-going   PT SHORT TERM GOAL #3   Title Pt will complete paper Modified Oswestry Disability Index to assess impact of symptoms on quality of life. 07/30/14.    Baseline Completed 6/24 with score of 14%.   Time 4   Period Weeks   Status Achieved           PT Long Term Goals - 07/13/14 1652    PT LONG TERM GOAL #1   Title Pt will increase gait speed from 2.59 ft/sec to 3.19 ft/sec to indicate improved efficiency of ambulation. Target: 08/27/14.   Time 8   Period Weeks   Status On-going   PT LONG TERM GOAL #2   Title Pt will decrease Modified Oswestry score by 6 points from baseline to indicate significant decrease in symptom-related quality of life. Target: 08/27/14.   Baseline Baseline: 14%.   Time 8   Period Weeks   Status On-going   PT LONG TERM GOAL #3   Title Pt will ambulate 1,000' over unlevel, outdoor surfaces without LOB or need for rest break due to pain/symptoms in lower back or LE's to indicate progress toward PLOF. Target: 08/27/14.   Time 8   Period Weeks   Status On-going   PT LONG TERM GOAL #4   Title Pt will demonstrate 2 techniques used to mitigate LE numbness to indicate pt ability to self-manage symptoms. Target: 08/27/14.   Time 8   Period Weeks   Status On-going   PT LONG TERM GOAL #5   Title Pt will report 2-point decrease in within-session pain rating to facilitate improved quality of life. Target: 08/27/14.   Time 8   Period Weeks   Status On-going               Plan - 07/13/14 1653    Clinical Impression Statement Pt exhibits excellent compliance with HEP and consistent carryover of education provided during PT sessions. Pt tolerating lumbar stabilization program well and demonstrating ability to self-address LLE numbness using exercises. Continue per POC.   Pt will benefit from skilled therapeutic intervention in order to improve on the following deficits Abnormal gait;Decreased activity tolerance;Difficulty walking;Impaired sensation;Decreased coordination;Pain;Postural dysfunction;Decreased strength;Improper body mechanics   Rehab Potential Good   Clinical Impairments Affecting Rehab Potential Pt reports  having had sciatica since 1980.   PT Frequency 2x / week   PT Duration 8 weeks   PT Treatment/Interventions ADLs/Self Care Home Management;Functional mobility training;Therapeutic activities;Therapeutic exercise;Balance training;Neuromuscular re-education;Patient/family education   PT Next Visit Plan Continue to progress core stabilization exercises. Consider adding lumbar multifidi strengthening.   PT Home Exercise Plan Progress core stabilization program.   Consulted and Agree with Plan of Care Patient        Problem List Patient Active Problem List   Diagnosis Date Noted  . 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 Knee pain, bilateral 06/21/2012  . Primary osteoarthritis of both knees 06/21/2012  . Generalized osteoarthritis of hand 06/21/2012   Jorje Guild, PT, DPT [facility] [address] [place], [place], [address] Phone: [phone]  Fax:  [phone] 07/13/2014, 5:03 PM

## 2014-07-13 NOTE — Patient Instructions (Addendum)
Achilles / Gastroc, Standing   Stand, right foot behind, heel on floor and turned slightly out, leg straight, forward leg bent. Move hips forward. Hold _60_ seconds. Make sure this is a slow, gentle calf stretch - it should not be painful.  Then, bend your back knee slightly. You should feel a deep stretch in your calf. Hold for 60 seconds.                        Log Roll   Lying on back, bend left knee and place left arm across chest. Roll all in one movement to the right. Reverse to roll to the left. Always move as one unit.   Copyright  VHI. All rights reserved.

## 2014-07-18 ENCOUNTER — Ambulatory Visit: Payer: Medicare Other | Admitting: Physical Therapy

## 2014-07-18 ENCOUNTER — Encounter: Payer: Self-pay | Admitting: Physical Therapy

## 2014-07-18 DIAGNOSIS — R531 Weakness: Secondary | ICD-10-CM

## 2014-07-18 DIAGNOSIS — M5442 Lumbago with sciatica, left side: Secondary | ICD-10-CM

## 2014-07-18 NOTE — Therapy (Signed)
[facility] [address] [place], [place], [address] Phone: [phone]   Fax:  [phone]  Physical Therapy Treatment  Patient Details  Name: Kristen Pham MRN: [id number] Date of Birth: 09/13/1924 Referring Provider:  Creola Cornusso, John, MD  Encounter Date: 07/18/2014      PT End of Session - 07/18/14 0807    Visit Number 4   Number of Visits 17   Date for PT Re-Evaluation 08/31/14   Authorization Type Medicare - G Codes every 10 visits   PT Start Time 0803   PT Stop Time 0845   PT Time Calculation (min) 42 min   Activity Tolerance Patient tolerated treatment well;No increased pain   [facility] for tasks assessed/performed      Past Medical History  Diagnosis Date  . Hypertension   . Carpal tunnel syndrome     Past Surgical History  Procedure Laterality Date  . Cesarean section    . Abdominal hysterectomy    . Hand surgery      bilateral release   . Tonsilectomy, adenoidectomy, bilateral myringotomy and tubes      There were no vitals filed for this visit.  Visit Diagnosis:  Bilateral low back pain with left-sided sciatica  Weakness generalized      Subjective Assessment - 07/18/14 0805    Subjective No new complaints. Reports stiffness with waking up, better after stretching. No falls.   Currently in Pain? Yes   Pain Score 4    Pain Location Back   Pain Orientation Left;Mid;Lower   Pain Descriptors / Indicators Sore;Tightness;Radiating   Pain Type Chronic pain   Pain Radiating Towards down left leg to foot/toes   Pain Onset More than a month ago   Pain Frequency Intermittent   Aggravating Factors  stretching up onto toes, walking   Pain Relieving Factors stretching, rest     Treatment: On mat Posterior pelvic tilt 5 sec hold x 10 reps with verbal/tactile cues on correct technique With red pball under legs - bridge 5 sec hold x 10 reps - lower trunk rotation left/right 5  sec hold x 10 reps each way - ball pass from hands <>feet with partial v-sit x 8-10 reps with mod cues/assist on correct ex technique - ball dribble with feet/legs (in table top position) x 9-10 reps with cues and rest breaks needed due to fatigue Isometric bil hip flexion 5 sec's x 10 reps Table top hold 15 sec's x 5 reps Table top hold with alternating heel taps to mat 2 sets of 5 reps bil legs Prone on elbows, 10 sec hold's x 5 reps Prone heel squeezes 5 sec's x 10 reps Quadruped Cat/camel with mod cues and facilitation x 10 reps Modified child's pose (pt unable to fully sit on her feet due to knee pain), 15 sec's x 3 reps   *pt required mod to max multimodal cues to engage abdominals and perform ex's correctly.          PT Short Term Goals - 07/13/14 1651    PT SHORT TERM GOAL #1   Title Pt will perform home exercises independently to maximize functional gains made in physical therapy. Target: 07/30/14.   Time 4   Period Weeks   Status On-going   PT SHORT TERM GOAL #2   Title Pt will ambulate at gait speed of 2.89 ft/sec to indicate pt safety with community ambulation. Target: 07/30/14.   Time 4   Period Weeks  Status On-going   PT SHORT TERM GOAL #3   Title Pt will complete paper Modified Oswestry Disability Index to assess impact of symptoms on quality of life. 07/30/14.   Baseline Completed 6/24 with score of 14%.   Time 4   Period Weeks   Status Achieved           PT Long Term Goals - 07/13/14 1652    PT LONG TERM GOAL #1   Title Pt will increase gait speed from 2.59 ft/sec to 3.19 ft/sec to indicate improved efficiency of ambulation. Target: 08/27/14.   Time 8   Period Weeks   Status On-going   PT LONG TERM GOAL #2   Title Pt will decrease Modified Oswestry score by 6 points from baseline to indicate significant decrease in symptom-related quality of life. Target: 08/27/14.   Baseline Baseline: 14%.   Time 8   Period Weeks   Status On-going   PT LONG TERM GOAL  #3   Title Pt will ambulate 1,000' over unlevel, outdoor surfaces without LOB or need for rest break due to pain/symptoms in lower back or LE's to indicate progress toward PLOF. Target: 08/27/14.   Time 8   Period Weeks   Status On-going   PT LONG TERM GOAL #4   Title Pt will demonstrate 2 techniques used to mitigate LE numbness to indicate pt ability to self-manage symptoms. Target: 08/27/14.   Time 8   Period Weeks   Status On-going   PT LONG TERM GOAL #5   Title Pt will report 2-point decrease in within-session pain rating to facilitate improved quality of life. Target: 08/27/14.   Time 8   Period Weeks   Status On-going           Plan - 07/18/14 0807    Clinical Impression Statement Pt needed increased verbal, tactile and visual cues to correctly engage her core muscles and perform most of the exercises correctly. No issues reported with ex's performed. Pt making steady progress toward goals.   Pt will benefit from skilled therapeutic intervention in order to improve on the following deficits Abnormal gait;Decreased activity tolerance;Difficulty walking;Impaired sensation;Decreased coordination;Pain;Postural dysfunction;Decreased strength;Improper body mechanics   Rehab Potential Good   Clinical Impairments Affecting Rehab Potential Pt reports having had sciatica since 1980.   PT Frequency 2x / week   PT Duration 8 weeks   PT Treatment/Interventions ADLs/Self Care Home Management;Functional mobility training;Therapeutic activities;Therapeutic exercise;Balance training;Neuromuscular re-education;Patient/family education   PT Next Visit Plan Continue to progress core stabilization exercises. Consider adding lumbar multifidi strengthening.   PT Home Exercise Plan Progress core stabilization program.   Consulted and Agree with Plan of Care Patient        Problem List Patient Active Problem List   Diagnosis Date Noted  . 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 Knee pain, bilateral 06/21/2012   . Primary osteoarthritis of both knees 06/21/2012  . Generalized osteoarthritis of hand 06/21/2012    Sallyanne Kuster 07/19/2014, 11:41 AM  Sallyanne Kuster, PTA, [facility] [address] [place], [place] [address] [phone] 07/19/2014, 11:41 AM

## 2014-07-20 ENCOUNTER — Ambulatory Visit: Payer: Medicare Other | Admitting: Physical Therapy

## 2014-07-25 ENCOUNTER — Ambulatory Visit: Payer: Medicare Other | Attending: Diagnostic Neuroimaging | Admitting: Physical Therapy

## 2014-07-25 DIAGNOSIS — R209 Unspecified disturbances of skin sensation: Secondary | ICD-10-CM

## 2014-07-25 DIAGNOSIS — R269 Unspecified abnormalities of gait and mobility: Secondary | ICD-10-CM | POA: Insufficient documentation

## 2014-07-25 DIAGNOSIS — R531 Weakness: Secondary | ICD-10-CM | POA: Diagnosis present

## 2014-07-25 DIAGNOSIS — M5442 Lumbago with sciatica, left side: Secondary | ICD-10-CM

## 2014-07-25 NOTE — Therapy (Signed)
[facility] [address] [place], [place], [address] Phone: [phone]   Fax:  [phone]  Physical Therapy Treatment  Patient Details  Name: Kristen Pham MRN: [id number] Date of Birth: Dec 16, 1924 Referring Provider:  Creola Corn, MD  Encounter Date: 07/25/2014      PT End of Session - 07/25/14 1855    Visit Number 5   Number of Visits 17   Date for PT Re-Evaluation 08/31/14   Authorization Type Medicare - G Codes every 10 visits   PT Start Time 0803   PT Stop Time 0843   PT Time Calculation (min) 40 min   Activity Tolerance Patient tolerated treatment well;No increased pain   Behavior During Therapy [facility] for tasks assessed/performed      Past Medical History  Diagnosis Date  . Hypertension   . Carpal tunnel syndrome     Past Surgical History  Procedure Laterality Date  . Cesarean section    . Abdominal hysterectomy    . Hand surgery      bilateral release   . Tonsilectomy, adenoidectomy, bilateral myringotomy and tubes      There were no vitals filed for this visit.  Visit Diagnosis:  Bilateral low back pain with left-sided sciatica  Weakness generalized  Abnormal sensation of lower extremity      Subjective Assessment - 07/25/14 0806    Subjective Pt reports numbness in L foot only (not lower leg/ankle) this morning.   Limitations Walking   Currently in Pain? Yes   Pain Score 4    Pain Location Foot   Pain Orientation Left;Other (Comment);Medial  plantar and medial aspect of foot   Pain Descriptors / Indicators Numbness;Heaviness   Pain Type Chronic pain   Pain Onset More than a month ago   Pain Frequency Intermittent   Aggravating Factors  upon waking; unsure "comes and goes"   Pain Relieving Factors pt unsure            Treatment   Therapeutic Exercises: - Prone on elbows, thoracolumbar extension x10 reps (x10-second holds); no cueing required for safe/proper technique. LLE  tingling decreased to 0/10 following exercise. - Supine, hook lying: pt demonstrating between-session carryover of correct hand placement to effectively palpate transverse abdominus (TA) muscle to ensure proper technique when performing HEP.  - Progressed from supine, hook lying TA activation (5reps x10-second holds) to TA activation with concurrent alternating B LE elevation 3 x5 consecutive reps, then progressed to 5 x10 consecutive reps (with increased pt difficulty) to simulate lumbar stabilization during gait. - Instructed pt in lumbar multifidi strengthening exercise 5 x10 steps per side resisted by green Theraband. See Patient Instructions for further detail.  Added to HEP.                         PT Education - 07/25/14 [phone]    Education provided Yes   Education Details Added multifidi strengthening to HEP.   Person(s) Educated Patient   Methods Explanation;Demonstration;Verbal cues;Handout   Comprehension Verbalized understanding;Returned demonstration          PT Short Term Goals - 07/13/14 1651    PT SHORT TERM GOAL #1   Title Pt will perform home exercises independently to maximize functional gains made in physical therapy. Target: 07/30/14.   Time 4   Period Weeks   Status On-going   PT SHORT TERM GOAL #2   Title Pt will ambulate at gait speed of 2.89 ft/sec to  indicate pt safety with community ambulation. Target: 07/30/14.   Time 4   Period Weeks   Status On-going   PT SHORT TERM GOAL #3   Title Pt will complete paper Modified Oswestry Disability Index to assess impact of symptoms on quality of life. 07/30/14.   Baseline Completed 6/24 with score of 14%.   Time 4   Period Weeks   Status Achieved           PT Long Term Goals - 07/13/14 1652    PT LONG TERM GOAL #1   Title Pt will increase gait speed from 2.59 ft/sec to 3.19 ft/sec to indicate improved efficiency of ambulation. Target: 08/27/14.   Time 8   Period Weeks   Status On-going   PT  LONG TERM GOAL #2   Title Pt will decrease Modified Oswestry score by 6 points from baseline to indicate significant decrease in symptom-related quality of life. Target: 08/27/14.   Baseline Baseline: 14%.   Time 8   Period Weeks   Status On-going   PT LONG TERM GOAL #3   Title Pt will ambulate 1,000' over unlevel, outdoor surfaces without LOB or need for rest break due to pain/symptoms in lower back or LE's to indicate progress toward PLOF. Target: 08/27/14.   Time 8   Period Weeks   Status On-going   PT LONG TERM GOAL #4   Title Pt will demonstrate 2 techniques used to mitigate LE numbness to indicate pt ability to self-manage symptoms. Target: 08/27/14.   Time 8   Period Weeks   Status On-going   PT LONG TERM GOAL #5   Title Pt will report 2-point decrease in within-session pain rating to facilitate improved quality of life. Target: 08/27/14.   Time 8   Period Weeks   Status On-going               Plan - 07/25/14 [id number]    Clinical Impression Statement Pt demonstrating excellent HEP compliance, appears to be utilizing techniques provided by PT to self-manage pain/LLE numbness/tingling. Added multifidi strengthening and progressed lum   Pt will benefit from skilled therapeutic intervention in order to improve on the following deficits Abnormal gait;Decreased activity tolerance;Difficulty walking;Impaired sensation;Decreased coordination;Pain;Postural dysfunction;Decreased strength;Improper body mechanics   Rehab Potential Good   Clinical Impairments Affecting Rehab Potential Pt reports having had sciatica since 1980.   PT Frequency 2x / week   PT Duration 8 weeks   PT Treatment/Interventions ADLs/Self Care Home Management;Functional mobility training;Therapeutic activities;Therapeutic exercise;Balance training;Neuromuscular re-education;Patient/family education   PT Next Visit Plan Check STG's. Assess pt performance of mutlifidi strengthening home exercise to ensure safety/correct  technique. Progress to TA activation with sit <> stand (with/without dowel) and during ambulation and assess LLE symptoms.   PT Home Exercise Plan Current HEP: prone on elbows, hook lying TA activation, heel raises, lumbar multifidi strengthening.   Consulted and Agree with Plan of Care Patient        Problem List Patient Active Problem List   Diagnosis Date Noted  . 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 Knee pain, bilateral 06/21/2012  . Primary osteoarthritis of both knees 06/21/2012  . Generalized osteoarthritis of hand 06/21/2012    Jorje GuildBlair Hobble, PT, DPT [facility] [address] [place], [place], [address] Phone: [phone]   Fax:  [phone] 07/25/2014, 7:06 PM

## 2014-07-25 NOTE — Patient Instructions (Signed)
Tie one end of your green stretchy band to a stationary surface (like the knob of a closed door or to a stair railing). Hold the other end of the band at chest-level. Take 5 steps sideways away from the door, increasing the tension on the band.Then, take 5 side steps back toward the door until you reach your starting position.   The resistance of the band will make you feel like turning at your waist, but try not to twist. Keep your toes, knees, hips, and shoulders pointing forward.  Do 5 sets of 5 sideways steps. Then, turn around 180 degrees (to face the other direction) and do the same thing (5 sets of 5 steps).

## 2014-07-27 ENCOUNTER — Ambulatory Visit: Payer: Medicare Other | Admitting: Physical Therapy

## 2014-07-27 DIAGNOSIS — M5442 Lumbago with sciatica, left side: Secondary | ICD-10-CM | POA: Diagnosis not present

## 2014-07-27 DIAGNOSIS — R531 Weakness: Secondary | ICD-10-CM

## 2014-07-27 DIAGNOSIS — R209 Unspecified disturbances of skin sensation: Secondary | ICD-10-CM

## 2014-07-27 NOTE — Therapy (Signed)
[facility] [place] [address] [place], [place], [address] Phone: [phone]   Fax:  [phone]  Physical Therapy Treatment  Patient Details  Name: Kristen Pham MRN: [id number] Date of Birth: [birth year] Referring Provider:  Shon Baton, MD  Encounter Date: 07/27/2014      PT End of Session - 07/27/14 1001    Visit Number 6   Number of Visits 17   Date for PT Re-Evaluation 08/31/14   Authorization Type Medicare - G Codes every 10 visits   PT Start Time 0805   PT Stop Time 0843   PT Time Calculation (min) 38 min   Activity Tolerance Patient tolerated treatment well   Behavior During Therapy [facility] for tasks assessed/performed      Past Medical History  Diagnosis Date  . Hypertension   . Carpal tunnel syndrome     Past Surgical History  Procedure Laterality Date  . Cesarean section    . Abdominal hysterectomy    . Hand surgery      bilateral release   . Tonsilectomy, adenoidectomy, bilateral myringotomy and tubes      There were no vitals filed for this visit.  Visit Diagnosis:  Weakness generalized  Bilateral low back pain with left-sided sciatica  Abnormal sensation of lower extremity      Subjective Assessment - 07/27/14 0810    Subjective Pt reporting no numbness/tingling today.   Limitations Walking   Currently in Pain? No/denies           Therapeutic Exercises: - Pt gave effective return demonstration of lumbar multifidi strengthening exercise 6 x10 steps per side resisted by green Theraband. Subtle to min cueing for alignment with effective within-session carryover. - B heel/toe raises with single UE support at countertop 2 x20 reps; no cueing required. Transitioned to L single leg heel raises to assess/address L plantar flexor strength x10 reps (to pt fatigue). - L gastroc stretch x60 seconds with min cueing for technique.  Gait Training: - During gait over level, unlevel surfaces (see  below for details) provided cueing for dynamic lumbar spine stabilization with emphasis on transverse abdominus (TA) activation. Pt reporting no onset of LLE symptoms during ambulation. Pt did, however, report increased "tiredness" in posterior aspect of B knees (moreso on R than LLE).               [facility] Adult PT Treatment/Exercise - 07/27/14 0001    Ambulation/Gait   Ambulation Distance (Feet) 463 Feet  then 369'   Assistive device None   Gait Pattern Step-to pattern;Decreased trunk rotation;Decreased dorsiflexion - left;Decreased hip/knee flexion - left   Ambulation Surface Level;Unlevel;Indoor;Outdoor;Paved   Gait velocity 3.03 ft/sec                PT Education - 07/27/14 1008    Education provided Yes   Education Details STG progress. Lumbar spine stabilization during ambulation.   Person(s) Educated Patient   Methods Explanation;Demonstration;Verbal cues;Tactile cues   Comprehension Verbalized understanding;Returned demonstration          PT Short Term Goals - 07/27/14 0810    PT SHORT TERM GOAL #1   Title Pt will perform home exercises independently to maximize functional gains made in physical therapy. Target: 07/30/14.   Time 4   Period Weeks   Status Achieved   PT SHORT TERM GOAL #2   Title Pt will ambulate at gait speed of 2.89 ft/sec to indicate pt safety with community ambulation. Target: 07/30/14.   Baseline  Achieved on 07/27/14 with gait speed of 3.03 ft/sec.   Time 4   Period Weeks   Status Achieved   PT SHORT TERM GOAL #3   Title Pt will complete paper Modified Oswestry Disability Index to assess impact of symptoms on quality of life. 07/30/14.   Baseline Completed 6/24 with score of 14%.   Time 4   Period Weeks   Status Achieved           PT Long Term Goals - 07/13/14 1652    PT LONG TERM GOAL #1   Title Pt will increase gait speed from 2.59 ft/sec to 3.19 ft/sec to indicate improved efficiency of ambulation. Target: 08/27/14.   Time 8    Period Weeks   Status On-going   PT LONG TERM GOAL #2   Title Pt will decrease Modified Oswestry score by 6 points from baseline to indicate significant decrease in symptom-related quality of life. Target: 08/27/14.   Baseline Baseline: 14%.   Time 8   Period Weeks   Status On-going   PT LONG TERM GOAL #3   Title Pt will ambulate 1,000' over unlevel, outdoor surfaces without LOB or need for rest break due to pain/symptoms in lower back or LE's to indicate progress toward PLOF. Target: 08/27/14.   Time 8   Period Weeks   Status On-going   PT LONG TERM GOAL #4   Title Pt will demonstrate 2 techniques used to mitigate LE numbness to indicate pt ability to self-manage symptoms. Target: 08/27/14.   Time 8   Period Weeks   Status On-going   PT LONG TERM GOAL #5   Title Pt will report 2-point decrease in within-session pain rating to facilitate improved quality of life. Target: 08/27/14.   Time 8   Period Weeks   Status On-going               Plan - 07/27/14 1011    Clinical Impression Statement All STG's met. Pt continues to demonstrate carryover of core activation, spinal stabilization. Pt reporting no LLE numbess/symptoms for the first time during this session. Continue per POC.   Pt will benefit from skilled therapeutic intervention in order to improve on the following deficits Abnormal gait;Decreased activity tolerance;Difficulty walking;Impaired sensation;Decreased coordination;Pain;Postural dysfunction;Decreased strength;Improper body mechanics   Rehab Potential Good   Clinical Impairments Affecting Rehab Potential Pt reports having had sciatica since 1980.   PT Frequency 2x / week   PT Duration 8 weeks   PT Treatment/Interventions ADLs/Self Care Home Management;Functional mobility training;Therapeutic activities;Therapeutic exercise;Balance training;Neuromuscular re-education;Patient/family education   PT Next Visit Plan Continue to reinforce spinal stabilization during  ambulation; introduce TA activation during sit <> stand (with/without dowel) and assess LLE symptoms.   PT Home Exercise Plan Current HEP: prone on elbows, hook lying TA activation, heel raises, lumbar multifidi strengthening.   Consulted and Agree with Plan of Care Patient        Problem List Patient Active Problem List   Diagnosis Date Noted  . 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 Knee pain, bilateral 06/21/2012  . Primary osteoarthritis of both knees 06/21/2012  . Generalized osteoarthritis of hand 06/21/2012    Billie Ruddy, PT, DPT [facility] [address] [place], [place], [address] Phone: [phone]   Fax:  [phone] 07/27/2014, 10:14 AM

## 2014-07-30 ENCOUNTER — Ambulatory Visit: Payer: Medicare Other | Admitting: Physical Therapy

## 2014-07-31 ENCOUNTER — Other Ambulatory Visit: Payer: Self-pay | Admitting: Internal Medicine

## 2014-07-31 DIAGNOSIS — N63 Unspecified lump in unspecified breast: Secondary | ICD-10-CM

## 2014-08-01 ENCOUNTER — Ambulatory Visit: Payer: Medicare Other | Admitting: Physical Therapy

## 2014-08-01 DIAGNOSIS — M5442 Lumbago with sciatica, left side: Secondary | ICD-10-CM

## 2014-08-01 DIAGNOSIS — R531 Weakness: Secondary | ICD-10-CM

## 2014-08-01 NOTE — Patient Instructions (Signed)
Functional Quadriceps: Sit to Stand   Sit on edge of chair, feet flat on floor. Hold broom stick behind your back to help to remind you to stay upright. Contract your core muscles while stand upright, extending knees fully.  Repeat _15___ times per set. Do __3__ sets per day.  http://orth.exer.us/735   Copyright  VHI. All rights reserved.

## 2014-08-01 NOTE — Therapy (Signed)
[facility] [address] [place], [place], [address] Phone: [phone]   Fax:  [phone]  Physical Therapy Treatment  Patient Details  Name: Kristen Pham MRN: [id number] Date of Birth: 04/26/1924 Referring Provider:  Creola Cornusso, John, MD  Encounter Date: 08/01/2014      PT End of Session - 08/01/14 1043    Visit Number 7   Number of Visits 17   Date for PT Re-Evaluation 08/31/14   Authorization Type Medicare - G Codes every 10 visits   PT Start Time 0808   PT Stop Time 0848   PT Time Calculation (min) 40 min   Activity Tolerance Patient tolerated treatment well;No increased pain   Behavior [facility] for tasks assessed/performed      Past Medical History  Diagnosis Date  . Hypertension   . Carpal tunnel syndrome     Past Surgical History  Procedure Laterality Date  . Cesarean section    . Abdominal hysterectomy    . Hand surgery      bilateral release   . Tonsilectomy, adenoidectomy, bilateral myringotomy and tubes      There were no vitals filed for this visit.  Visit Diagnosis:  Bilateral low back pain with left-sided sciatica  Weakness generalized      Subjective Assessment - 08/01/14 0814    Subjective Again, pt reporting no numbness/tingling in LLE today.   Limitations Walking   Currently in Pain? No/denies           Treatment   Noted excessive thoracic spine flexion, minimal hip/knee flexion during functional lifting (objects <5 lbs) from floor and sit<>stand.   Therapeutic Exercises: - Instructed pt in sit<>stand from neutral mat table height while holding dowel behind back for increased postural awareness. Pt gave effective return demonstration of sit<>stand x10 reps, then x15 reps with upright posture. Adde to HEP.   Therapeutic Activities: - See Patient Instructions for details. Pt with effective return demonstration of proper body mechanics, technique including the  following: placing feetclose to object, increasing hip/knee flexion (as opposed to thoracic spine forward flexion), holding object close to body.                      PT Education - 08/01/14 1042    Education provided Yes   Education Details HEP: added sit<>stand with dowel/broomstick. Explained centralizatio of radiculopathy symptoms with extension-based exercise and role of core stabilization in preventing symptoms. Issued handout for safe body mechanics with lifting, functional tasks.   Person(s) Educated Patient   Methods Explanation;Demonstration;Verbal cues;Handout   Comprehension Verbalized understanding;Returned demonstration          PT Short Term Goals - 07/27/14 0810    PT SHORT TERM GOAL #1   Title Pt will perform home exercises independently to maximize functional gains made in physical therapy. Target: 07/30/14.   Time 4   Period Weeks   Status Achieved   PT SHORT TERM GOAL #2   Title Pt will ambulate at gait speed of 2.89 ft/sec to indicate pt safety with community ambulation. Target: 07/30/14.   Baseline Achieved on 07/27/14 with gait speed of 3.03 ft/sec.   Time 4   Period Weeks   Status Achieved   PT SHORT TERM GOAL #3   Title Pt will complete paper Modified Oswestry Disability Index to assess impact of symptoms on quality of life. 07/30/14.   Baseline Completed 6/24 with score of 14%.   Time 4   Period  Weeks   Status Achieved           PT Long Term Goals - 07/13/14 1652    PT LONG TERM GOAL #1   Title Pt will increase gait speed from 2.59 ft/sec to 3.19 ft/sec to indicate improved efficiency of ambulation. Target: 08/27/14.   Time 8   Period Weeks   Status On-going   PT LONG TERM GOAL #2   Title Pt will decrease Modified Oswestry score by 6 points from baseline to indicate significant decrease in symptom-related quality of life. Target: 08/27/14.   Baseline Baseline: 14%.   Time 8   Period Weeks   Status On-going   PT LONG TERM GOAL #3    Title Pt will ambulate 1,000' over unlevel, outdoor surfaces without LOB or need for rest break due to pain/symptoms in lower back or LE's to indicate progress toward PLOF. Target: 08/27/14.   Time 8   Period Weeks   Status On-going   PT LONG TERM GOAL #4   Title Pt will demonstrate 2 techniques used to mitigate LE numbness to indicate pt ability to self-manage symptoms. Target: 08/27/14.   Time 8   Period Weeks   Status On-going   PT LONG TERM GOAL #5   Title Pt will report 2-point decrease in within-session pain rating to facilitate improved quality of life. Target: 08/27/14.   Time 8   Period Weeks   Status On-going               Plan - 08/01/14 1047    Clinical Impression Statement Focused on education, with emphasis on centralization of radiculopathy symptoms with extension-based exercise, role of core strengthening/lumbar stabilization, and safe body mechanics with lifting and other functional tasks. Pt receptive to information provided and exhibited within-session carryover. Continue per POC.   Pt will benefit from skilled therapeutic intervention in order to improve on the following deficits Abnormal gait;Decreased activity tolerance;Difficulty walking;Impaired sensation;Decreased coordination;Pain;Postural dysfunction;Decreased strength;Improper body mechanics   Rehab Potential Good   Clinical Impairments Affecting Rehab Potential Pt reports having had sciatica since 1980.   PT Frequency 2x / week   PT Duration 8 weeks   PT Treatment/Interventions ADLs/Self Care Home Management;Functional mobility training;Therapeutic activities;Therapeutic exercise;Balance training;Neuromuscular re-education;Patient/family education   PT Next Visit Plan Assess for between-session carryover of proper body mechanics with functional lifting/activities. Assess pt tolerance/symptom response to longer ambulation distances. If appropriate, increase Tband strength for multifidi exercise   PT Home  Exercise Plan Current HEP: prone on elbows, hook lying TA activation, heel raises, lumbar multifidi strengthening. Added 7/13: sit<>stand with dowel/broomstick for postural awareness.   Consulted and Agree with Plan of Care Patient        Problem List Patient Active Problem List   Diagnosis Date Noted  . 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 Knee pain, bilateral 06/21/2012  . Primary osteoarthritis of both knees 06/21/2012  . Generalized osteoarthritis of hand 06/21/2012    Jorje Guild, PT, DPT [facility] [address] [place], [place], [address] Phone: [phone]   Fax:  [phone] 08/01/2014, 10:55 AM

## 2014-08-02 ENCOUNTER — Ambulatory Visit: Payer: Medicare Other | Admitting: Physical Therapy

## 2014-08-02 DIAGNOSIS — M5442 Lumbago with sciatica, left side: Secondary | ICD-10-CM | POA: Diagnosis not present

## 2014-08-02 DIAGNOSIS — R531 Weakness: Secondary | ICD-10-CM

## 2014-08-02 DIAGNOSIS — R269 Unspecified abnormalities of gait and mobility: Secondary | ICD-10-CM

## 2014-08-02 NOTE — Patient Instructions (Signed)
Abduction: Clam (Eccentric) - Side-Lying - RIGHT ONLY   Lie on LEFT side with knees bent. Lift RIGHT knee, keeping feet together. Keep trunk steady. Slowly lower for 3-5 seconds. _20__ reps per set, _3__ sets per day.  Bracing With Bridging (Hook-Lying)   With neutral spine, tighten pelvic floor and abdominals and hold. Lift bottom. Hold for 2 seconds. Do this 10 times. Relax. Repeat __3_ times per day.  Piriformis Stretch, Supine   Lie supine, cross RIGHT ankle crossed onto the LEFT knee (as pictures). Holding bottom leg behind knee, gently pull legs toward chest and roll toward top-leg side. Feel stretch in hip or pelvic region. Hold 60 seconds. Repeat 2-3 times when you have buttock pain.  Knee to Chest - RIGHT   Lying supine, bend RIGHT knee to chest. Hold for 60 seconds. Do this 2-3 times when you have buttock pain.  Copyright  VHI. All rights reserved.

## 2014-08-02 NOTE — Therapy (Signed)
[facility] [address] [place], [place], [address] Phone: [phone]   Fax:  [phone]  Physical Therapy Treatment  Patient Details  Name: Kristen Pham MRN: [id number] Date of Birth: 03/11/1924 Referring Provider:  Creola Corn, MD  Encounter Date: 08/02/2014      PT End of Session - 08/02/14 1220    Visit Number 8   Number of Visits 17   Date for PT Re-Evaluation 08/31/14   Authorization Type Medicare - G Codes every 10 visits   PT Start Time 0847   PT Stop Time 0930   PT Time Calculation (min) 43 min   Activity Tolerance Patient tolerated treatment well   Behavior During Therapy [facility] for tasks assessed/performed      Past Medical History  Diagnosis Date  . Hypertension   . Carpal tunnel syndrome     Past Surgical History  Procedure Laterality Date  . Cesarean section    . Abdominal hysterectomy    . Hand surgery      bilateral release   . Tonsilectomy, adenoidectomy, bilateral myringotomy and tubes      There were no vitals filed for this visit.  Visit Diagnosis:  Weakness generalized  Abnormality of gait  Bilateral low back pain with left-sided sciatica      Subjective Assessment - 08/02/14 0851    Subjective Pt reports no numbness in LLE. She does report having had sudden "excruciating" pain in dorsum of L foot. Pain has subsided since this morning after pt utilized topical ointment on foot.   Limitations Walking   Currently in Pain? No/denies            [facility] PT Assessment - 08/02/14 0001    Strength   Overall Strength Comments 4/5 R hip ABD, 4+/5 L; gluteus maximus: 3+/5 on L, 4-/5 on R       Treatment   Therapeutic Exercises: - Instructed pt in R clamshells x20 reps (to fatigue), bridging x10 reps (to pt tolerance), R piriformis self-stretch, R gluteus maximus self-stretch to address R  buttock pain. Pt required verbal cueing for proper technique. Added to HEP. See Patient  Instructions for details.   Therapeutic Activities: - Pt performed functional lifting of objects of varying shape size (2 lbs - 6.5 lbs) to assess body mechanics. Pt required cueing only for standing more closely to object being picked up. Pt with effective within-session carryover.              [facility] Adult PT Treatment/Exercise - 08/02/14 0001    Ambulation/Gait   Ambulation Distance (Feet) 749 Feet  increased pain in R knee, onset of antalgic gait after 520'   Assistive device None   Gait Pattern Step-through pattern;Decreased dorsiflexion - left;Decreased weight shift to right;Left foot flat;Decreased stance time - right   Ambulation Surface Level;Unlevel;Indoor;Outdoor;Paved                PT Education - 08/02/14 1219    Education provided Yes   Education Details HEP: added R clamshells, bridging, and self-stretch of R piriformis and R gluteus maximus to address gait impairments, weakness, and pain in R buttock with community ambulation.   Person(s) Educated Patient   Methods Explanation;Demonstration;Verbal cues;Handout   Comprehension Verbalized understanding;Returned demonstration          PT Short Term Goals - 07/27/14 0810    PT SHORT TERM GOAL #1   Title Pt will perform home exercises independently to maximize functional gains made in physical  therapy. Target: 07/30/14.   Time 4   Period Weeks   Status Achieved   PT SHORT TERM GOAL #2   Title Pt will ambulate at gait speed of 2.89 ft/sec to indicate pt safety with community ambulation. Target: 07/30/14.   Baseline Achieved on 07/27/14 with gait speed of 3.03 ft/sec.   Time 4   Period Weeks   Status Achieved   PT SHORT TERM GOAL #3   Title Pt will complete paper Modified Oswestry Disability Index to assess impact of symptoms on quality of life. 07/30/14.   Baseline Completed 6/24 with score of 14%.   Time 4   Period Weeks   Status Achieved           PT Long Term Goals - 07/13/14 1652    PT LONG  TERM GOAL #1   Title Pt will increase gait speed from 2.59 ft/sec to 3.19 ft/sec to indicate improved efficiency of ambulation. Target: 08/27/14.   Time 8   Period Weeks   Status On-going   PT LONG TERM GOAL #2   Title Pt will decrease Modified Oswestry score by 6 points from baseline to indicate significant decrease in symptom-related quality of life. Target: 08/27/14.   Baseline Baseline: 14%.   Time 8   Period Weeks   Status On-going   PT LONG TERM GOAL #3   Title Pt will ambulate 1,000' over unlevel, outdoor surfaces without LOB or need for rest break due to pain/symptoms in lower back or LE's to indicate progress toward PLOF. Target: 08/27/14.   Time 8   Period Weeks   Status On-going   PT LONG TERM GOAL #4   Title Pt will demonstrate 2 techniques used to mitigate LE numbness to indicate pt ability to self-manage symptoms. Target: 08/27/14.   Time 8   Period Weeks   Status On-going   PT LONG TERM GOAL #5   Title Pt will report 2-point decrease in within-session pain rating to facilitate improved quality of life. Target: 08/27/14.   Time 8   Period Weeks   Status On-going               Plan - 08/02/14 1221    Clinical Impression Statement With increased distance ambulated outdoors, pt reporting increased pain in R buttock/thigh and exhibiting antalgic gait pattern. After reassessing strength of B hip extensors, abductors, expanded on HEP to add further hip strengthening to improve activity tolerance, incease safety with community ambulation. Continue per POC.   Pt will benefit from skilled therapeutic intervention in order to improve on the following deficits Abnormal gait;Decreased activity tolerance;Difficulty walking;Impaired sensation;Decreased coordination;Pain;Postural dysfunction;Decreased strength;Improper body mechanics   Rehab Potential Good   Clinical Impairments Affecting Rehab Potential Pt reports having had sciatica since 1980.   PT Frequency 2x / week   PT Duration  8 weeks   PT Treatment/Interventions ADLs/Self Care Home Management;Functional mobility training;Therapeutic activities;Therapeutic exercise;Balance training;Neuromuscular re-education;Patient/family education   PT Next Visit Plan Assess performance of home exercises for hip strengthening, stretching. Continue to assess pt tolerance/gait pattern with ambulating longer distances.   PT Home Exercise Plan Current HEP: prone on elbows, hook lying TA activation, heel raises, lumbar multifidi strengthening. Added 7/13: sit<>stand with dowel/broomstick for postural awareness. Added 7/14: R clamshell, bridging, piriformis and glute max self-stretch.   Consulted and Agree with Plan of Care Patient        Problem List Patient Active Problem List   Diagnosis Date Noted  . 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  Knee pain, bilateral 06/21/2012  . Primary osteoarthritis of both knees 06/21/2012  . Generalized osteoarthritis of hand 06/21/2012    Jorje Guild, PT, DPT [facility] [address] [place], [place], [address] Phone: [phone]   Fax:  [phone] 08/02/2014, 12:24 PM

## 2014-08-03 ENCOUNTER — Ambulatory Visit: Payer: Medicare Other | Admitting: Physical Therapy

## 2014-08-08 ENCOUNTER — Ambulatory Visit: Payer: Medicare Other | Admitting: Physical Therapy

## 2014-08-09 ENCOUNTER — Ambulatory Visit: Payer: Medicare Other | Admitting: Physical Therapy

## 2014-08-09 DIAGNOSIS — R531 Weakness: Secondary | ICD-10-CM

## 2014-08-09 DIAGNOSIS — R269 Unspecified abnormalities of gait and mobility: Secondary | ICD-10-CM

## 2014-08-09 DIAGNOSIS — M5442 Lumbago with sciatica, left side: Secondary | ICD-10-CM

## 2014-08-09 NOTE — Therapy (Signed)
[facility] [address] [place], [place], [address] Phone: [phone]   Fax:  [phone]  Physical Therapy Treatment  Patient Details  Name: Kristen Pham MRN: [id number] Date of Birth: Oct 11, 1924 Referring Provider:  Creola Corn, MD  Encounter Date: 08/09/2014      PT End of Session - 08/09/14 0946    Visit Number 9   Number of Visits 17   Date for PT Re-Evaluation 08/31/14   Authorization Type Medicare - G Codes every 10 visits   PT Start Time 0847   PT Stop Time 0933   PT Time Calculation (min) 46 min   Activity Tolerance Patient tolerated treatment well   Behavior During Therapy [facility] for tasks assessed/performed      Past Medical History  Diagnosis Date  . Hypertension   . Carpal tunnel syndrome     Past Surgical History  Procedure Laterality Date  . Cesarean section    . Abdominal hysterectomy    . Hand surgery      bilateral release   . Tonsilectomy, adenoidectomy, bilateral myringotomy and tubes      There were no vitals filed for this visit.  Visit Diagnosis:  Weakness generalized  Abnormality of gait  Bilateral low back pain with left-sided sciatica      Subjective Assessment - 08/09/14 0851    Subjective "I think the numbness is getting better." Pt also states, "In the morning, the foot on both sides is very heavy, like there is a lump there." Pt has been performing exercises for hip strengthening/stretching.   Limitations Walking   Currently in Pain? No/denies          Therapeutic Exercises focused on increasing lumbar spine stability during dynamic activities, improving strength in B hip extensors/abductors and ankle plantarflexors, and increasing extensibility of R piriformis, hamstrings and B iliopsoas to improve hip/pelvic alignment and address pain in lower back and LE's. Pt performed all home exercises with the exception of prone and standing thoracolumbar extension (due  to no c/o LE numbness today). Modified R piriformis stretch, provided cueing for proper technique with bridging, clam shells. Progressed lumbar multifidi strengthening changing from green to blue Tband. Added Thomas stretch due to decreased iliopsoas, rectus femoris extensibility noted during this session. All repetitions performed to pt fatigue. See Patient Instructions for rep/set number, further detail on all exercises.                         PT Education - 08/09/14 0945    Education provided Yes   Education Details HEP: modified R piriformis stretch and provided cueing for proper bridging, clam shells. Progressed lumbar multifidi strengthening.   Person(s) Educated Patient   Methods Explanation;Demonstration;Verbal cues;Handout   Comprehension Verbalized understanding;Returned demonstration          PT Short Term Goals - 07/27/14 0810    PT SHORT TERM GOAL #1   Title Pt will perform home exercises independently to maximize functional gains made in physical therapy. Target: 07/30/14.   Time 4   Period Weeks   Status Achieved   PT SHORT TERM GOAL #2   Title Pt will ambulate at gait speed of 2.89 ft/sec to indicate pt safety with community ambulation. Target: 07/30/14.   Baseline Achieved on 07/27/14 with gait speed of 3.03 ft/sec.   Time 4   Period Weeks   Status Achieved   PT SHORT TERM GOAL #3   Title Pt will complete paper  Modified Oswestry Disability Index to assess impact of symptoms on quality of life. 07/30/14.   Baseline Completed 6/24 with score of 14%.   Time 4   Period Weeks   Status Achieved           PT Long Term Goals - 07/13/14 1652    PT LONG TERM GOAL #1   Title Pt will increase gait speed from 2.59 ft/sec to 3.19 ft/sec to indicate improved efficiency of ambulation. Target: 08/27/14.   Time 8   Period Weeks   Status On-going   PT LONG TERM GOAL #2   Title Pt will decrease Modified Oswestry score by 6 points from baseline to indicate  significant decrease in symptom-related quality of life. Target: 08/27/14.   Baseline Baseline: 14%.   Time 8   Period Weeks   Status On-going   PT LONG TERM GOAL #3   Title Pt will ambulate 1,000' over unlevel, outdoor surfaces without LOB or need for rest break due to pain/symptoms in lower back or LE's to indicate progress toward PLOF. Target: 08/27/14.   Time 8   Period Weeks   Status On-going   PT LONG TERM GOAL #4   Title Pt will demonstrate 2 techniques used to mitigate LE numbness to indicate pt ability to self-manage symptoms. Target: 08/27/14.   Time 8   Period Weeks   Status On-going   PT LONG TERM GOAL #5   Title Pt will report 2-point decrease in within-session pain rating to facilitate improved quality of life. Target: 08/27/14.   Time 8   Period Weeks   Status On-going               Plan - 08/09/14 0947    Clinical Impression Statement Addressed pt safe/proper technique with home exercises with focus on hip strengthening/stretching. Added iliopsoas, rectus femoris stretch due to decreased extensibiliy noted in this muscle group, bilaterally. Progressed/modified HEP, as needed. Pt consistently reporting no LLE numbness but does report "heaviness" in B feet/ankles (worst in morning) as well as buttock/thigh pain with increased fatigue. Pt exhibits excellent HEP compliance and motivation. Continue per POC.   Pt will benefit from skilled therapeutic intervention in order to improve on the following deficits Abnormal gait;Decreased activity tolerance;Difficulty walking;Impaired sensation;Decreased coordination;Pain;Postural dysfunction;Decreased strength;Improper body mechanics   Clinical Impairments Affecting Rehab Potential Pt reports having had sciatica since 1980.   PT Frequency 2x / week   PT Duration 8 weeks   PT Treatment/Interventions ADLs/Self Care Home Management;Functional mobility training;Therapeutic activities;Therapeutic exercise;Balance training;Neuromuscular  re-education;Patient/family education   PT Next Visit Plan GCODES. Provide pt with additional handout of exercises from 7/21.    PT Home Exercise Plan See Pt Instructions on 7/21 for complete home exercises.    Consulted and Agree with Plan of Care Patient        Problem List Patient Active Problem List   Diagnosis Date Noted  . 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 Knee pain, bilateral 06/21/2012  . Primary osteoarthritis of both knees 06/21/2012  . Generalized osteoarthritis of hand 06/21/2012    Jorje Guild, PT, DPT [facility] [address] [place], [place], [address] Phone: [phone]   Fax:  [phone] 08/09/2014, 9:58 AM

## 2014-08-09 NOTE — Patient Instructions (Addendum)
Do these exercises on Monday, Wednesday, and Friday:  Multifidi Strengthening  Tie one end of your BLUE stretchy band to a stationary surface (like the knob of a closed door or to a stair railing). Hold the other end of the band at chest-level. Take 5 steps sideways away from the door, increasing the tension on the band.Then, take 5 side steps back toward the door until you reach your starting position.  The resistance of the band will make you feel like turning at your waist, but try not to twist. Keep your toes, knees, hips, and shoulders pointing forward.  Do 5 sets of 5 sideways steps. Then, turn around 180 degrees (to face the other direction) and do the same thing (5 sets of 5 steps).         TA Activation   Lie with knees bent, feet flat. Place fingers on hip pointer bones; move hands inward (toward belly button) 2 finger widths, then doward 3 finger widths. Contract abdominals, pulling naval toward spine (as though you're putting on a tight pair of pants). You should feel this muscle contract under your finger tips. Hold for 5 seconds. Relax. Repeat for a total 10 repetitions, 2-3 times per day.  Heel Raises   Stand at countertop with one hand to support. Make sure you contract your core muscles (like we talked about this therapy). With knees straight, raise heels off ground. Hold _2__ seconds. Relax. Repeat 15 times. Do this 3 times per day.  Achilles / Gastroc, Standing   Stand, right foot behind, heel on floor and turned slightly out, leg straight, forward leg bent. Move hips forward. Hold _60_ seconds. Make sure this is a slow, gentle calf stretch - it should not be painful.  Then, bend your back knee slightly. You should feel a deep stretch in your calf. Hold for 60 seconds.     Do these exercises on Tuesday, Thursday, and Saturday:  Abduction: Clam (Eccentric) - Side-Lying - RIGHT ONLY   Lie on LEFT side with knees bent. Lift RIGHT knee, keeping feet  together. Keep trunk steady. Slowly lower for 3-5 seconds. _15__ reps per set, _3__ sets per day.  Bracing With Bridging (Hook-Lying)   With neutral spine, tighten pelvic floor and abdominals and hold. Lift bottom. Hold for 2 seconds, then slowly lower back to starting position. Do this 10 times. Relax. Repeat __3_ times per day.   Hip Flexor Stretch   Lying on back near edge of bed and bring both knees to your chest. While keeping your RIGHT knee close to your chest, let the LEFT left hang off the enge (your thigh should also be off the edge). You should feel a gentle stretch in the front of the LEFT hip/thigh. Hold for 60 seconds. Then, perform the same stretch on the RIGHT leg. Do this stretch twice on each leg.   Do these exercises if you have buttock pain:  Knee to Chest - RIGHT   Lying supine, bend RIGHT knee to chest. Hold for 60 seconds. Do this 2-3 times when you have pain in the right buttock.  Piriformis Stretch, Sitting PNF - RIGHT   Sit with your RIGHT ankle crossed over the LEFT knee (as pictured). Lean torso forward until tension is felt in the back of the RIGHT leg/buttock. Hold for 30 seconds. Do this 4 times total when you have pain in the right buttock.  Do these exercises when you have numbness in your LEFT leg: On Elbows (Prone)  Rise up on elbows as high as possible, keeping hips on bed. Hold 5-10 seconds. Repeat 10 times per set. Do __2-3__ sessions per day and/or when you have increased numbness in legs.   Hip Extension: Two Feet   Stand facing a wall; place both hands on wall. Bend backward and push hips forward toward wall. Keep spine neutral, do not arch back. Hold _5-10__ seconds. Do _10__ times or until leg numbness subsides. You can use this to address left leg numbness when you're unable to lie down (when you aren't at home).

## 2014-08-10 ENCOUNTER — Other Ambulatory Visit: Payer: Medicare Other

## 2014-08-10 ENCOUNTER — Ambulatory Visit: Payer: Medicare Other | Admitting: Physical Therapy

## 2014-08-15 ENCOUNTER — Ambulatory Visit: Payer: Medicare Other | Admitting: Physical Therapy

## 2014-08-15 ENCOUNTER — Ambulatory Visit
Admission: RE | Admit: 2014-08-15 | Discharge: 2014-08-15 | Disposition: A | Discharge status: Home / Self Care | Payer: Medicare Other | Source: Ambulatory Visit | Attending: Internal Medicine | Admitting: Internal Medicine

## 2014-08-15 DIAGNOSIS — N63 Unspecified lump in unspecified breast: Secondary | ICD-10-CM

## 2014-08-17 ENCOUNTER — Ambulatory Visit: Payer: Medicare Other | Admitting: Physical Therapy

## 2014-08-20 ENCOUNTER — Encounter: Payer: Self-pay | Admitting: Diagnostic Neuroimaging

## 2014-08-20 ENCOUNTER — Ambulatory Visit (INDEPENDENT_AMBULATORY_CARE_PROVIDER_SITE_OTHER): Payer: Medicare Other | Admitting: Diagnostic Neuroimaging

## 2014-08-20 VITALS — BP 141/67 | HR 68 | Ht 61.0 in | Wt 115.4 lb

## 2014-08-20 DIAGNOSIS — R200 Anesthesia of skin: Secondary | ICD-10-CM

## 2014-08-20 DIAGNOSIS — R208 Other disturbances of skin sensation: Secondary | ICD-10-CM

## 2014-08-20 DIAGNOSIS — M5442 Lumbago with sciatica, left side: Secondary | ICD-10-CM | POA: Diagnosis not present

## 2014-08-20 NOTE — Progress Notes (Signed)
[facility]  PATIENT: Kristen Pham DOB: Jul 27, 1924  REFERRING CLINICIAN: Ewell Poe HISTORY FROM: patient  REASON FOR VISIT: follow up   HISTORICAL  CHIEF COMPLAINT:  Chief Complaint  Patient presents with  . Numbness    rm 7, "heavy feeling of left foot, numbing sensation"  . Follow-up    "after PT"    HISTORY OF PRESENT ILLNESS:   UPDATE 08/20/14: Since last visit, doing well. PT is helping. Numbness improved. Overall happy with results.  PRIOR HPI (05/15/14): 81 year old right-handed female here for evaluation of lower extremity numbness since 2011. Left leg more affected than the right leg. She has history of sciatic nerve pain and low back from 1981 which resolved with conservative management. Patient also has history of right Bell's palsy many years ago. She has chronic low back pain. Current numbness in her legs is not painful. She has some mild weakness. Pain radiates from her lower back, buttock, thigh, into her calf. No problems with her arms or hands. No neck pain. No facial numbness or weakness.   REVIEW OF SYSTEMS: Full 14 system review of systems performed and notable only for numbness muscle crapms joint pain.    ALLERGIES: Allergies  Allergen Reactions  . Ivp Dye [Iodinated Diagnostic Agents]     Felt confused, disoriented    HOME MEDICATIONS: Outpatient Prescriptions Prior to Visit  Medication Sig Dispense Refill  . amLODipine (NORVASC) 10 MG tablet Take 10 mg by mouth daily.    Marland Kitchen atenolol (TENORMIN) 50 MG tablet Take 50 mg by mouth daily.    . calcium-vitamin D (OSCAL WITH D) 500-200 MG-UNIT per tablet Take 2 tablets by mouth.    Marland Kitchen glucosamine-chondroitin 500-400 MG tablet Take 1 tablet by mouth daily.    . traMADol (ULTRAM) 50 MG tablet Take one tablet twice a day as needed for pain 180 tablet 3  . fluocinonide cream (LIDEX) 0.05 % as needed.    Marland Kitchen LIPITOR 20 MG tablet Take 20 mg by mouth at bedtime.     No  facility-administered medications prior to visit.    PAST MEDICAL HISTORY: Past Medical History  Diagnosis Date  . Hypertension   . Carpal tunnel syndrome     PAST SURGICAL HISTORY: Past Surgical History  Procedure Laterality Date  . Cesarean section    . Abdominal hysterectomy    . Hand surgery      bilateral release   . Tonsilectomy, adenoidectomy, bilateral myringotomy and tubes      FAMILY HISTORY: Family History  Problem Relation Age of Onset  . Cerebral aneurysm Father     SOCIAL HISTORY:  History   Social History  . Marital Status: Widowed    Spouse Name: N/A  . Number of Children: 1  . Years of Education: associates   Occupational History  . retired     Social History Main Topics  . Smoking status: Never Smoker   . Smokeless tobacco: Never Used  . Alcohol Use: 0.0 oz/week    0 Standard drinks or equivalent per week     Comment: occasionally   . Drug Use: No  . Sexual Activity: Not on file   Other Topics Concern  . Not on file   Social History Narrative   Lives at home alone   Drinks coke and coffee occasionally      PHYSICAL EXAM  Filed Vitals:   08/20/14 0905  BP: 141/67  Pulse: 68  Height:  (1.549 m)  Weight: 115 lb  6.4 oz (52.345 kg)    Body mass index is 21.82 kg/(m^2).  No exam data present  No flowsheet data found.  GENERAL EXAM: Patient is in no distress; well developed, nourished and groomed; neck is supple; APPEARS MUCH YOUNGER THAN STATED AGE; ARTHRITIS CHANGES IN FINGERS  CARDIOVASCULAR: Regular rate and rhythm, no murmurs, no carotid bruits  NEUROLOGIC: MENTAL STATUS: awake, alert, language fluent, comprehension intact, naming intact, fund of knowledge appropriate CRANIAL NERVE: POST-SURGICAL PUPILS; MIN REACTION, visual fields full to confrontation, extraocular muscles intact, no nystagmus, facial sensation and strength symmetric, INT RIGHT HEMIFACIAL SPASM, hearing intact, palate elevates symmetrically, uvula  midline, shoulder shrug symmetric, tongue midline. MOTOR: normal bulk and tone, full strength in the BUE, BLE SENSORY: normal and symmetric to light touch, DECR VIB AT TOES COORDINATION: finger-nose-finger, fine finger movements normal REFLEXES: BUE 2, KNEES TRACE, ANKLES 0 GAIT/STATION: narrow based gait; able to walk on toes, heels and tandem; romberg is negative    DIAGNOSTIC DATA (LABS, IMAGING, TESTING) - I reviewed patient records, labs, notes, testing and imaging myself where available.  Lab Results  Component Value Date   WBC 10.6* 12/02/2009   HGB 12.4 12/02/2009   HCT 36.9 12/02/2009   MCV 91.1 12/02/2009   PLT 306 12/02/2009      Component Value Date/Time   NA 143 07/24/2009 2036   K 4.1 07/24/2009 2036   CL 105 07/24/2009 2036   GLUCOSE 95 07/24/2009 2036   BUN 14 07/24/2009 2036   CREATININE 0.7 07/24/2009 2036   No results found for: CHOL, HDL, LDLCALC, LDLDIRECT, TRIG, CHOLHDL Lab Results  Component Value Date   HGBA1C 5.9* 05/15/2014   Lab Results  Component Value Date   VITAMINB12 768 05/15/2014   Lab Results  Component Value Date   TSH 2.300 05/15/2014     08/10/13 MRI BRAIN - Labyrinthine structures are symmetric and normal bilaterally. No internal auditory canal enhancing lesion. No abnormal vessels identified in the posterior fossa. Major intracranial vascular structures are patent. Right-sided dominant dural drainage without associated abnormality detected as cause of the patient's symptoms. Mild to moderate small vessel disease type changes. Opacification right maxillary sinus with central inspissated material. Minimal to mild mucosal thickening ethmoid sinus air cells and sphenoid sinus air cells.    ASSESSMENT AND PLAN  81 y.o. year old female here with lower extremity numbness since 2011. No pain or weakness. Has improved with PT sessions.  Dx: lumbar radiculopathy  PLAN: - conservative mgmt for now; continue PT  Return if symptoms  worsen or fail to improve, for return to PCP.    Suanne Marker, MD 08/20/2014, 9:19 AM Certified in Neurology, Neurophysiology and Neuroimaging  [facility] [address] [place], [place] [address] [phone]

## 2014-08-22 ENCOUNTER — Ambulatory Visit: Payer: Medicare Other | Admitting: Physical Therapy

## 2014-08-24 ENCOUNTER — Ambulatory Visit: Payer: Medicare Other | Attending: Diagnostic Neuroimaging | Admitting: Physical Therapy

## 2014-08-24 DIAGNOSIS — R531 Weakness: Secondary | ICD-10-CM | POA: Insufficient documentation

## 2014-08-24 DIAGNOSIS — M5442 Lumbago with sciatica, left side: Secondary | ICD-10-CM | POA: Insufficient documentation

## 2014-08-24 DIAGNOSIS — R209 Unspecified disturbances of skin sensation: Secondary | ICD-10-CM | POA: Insufficient documentation

## 2014-08-24 DIAGNOSIS — R269 Unspecified abnormalities of gait and mobility: Secondary | ICD-10-CM | POA: Insufficient documentation

## 2014-08-29 ENCOUNTER — Ambulatory Visit: Payer: Medicare Other | Admitting: Physical Therapy

## 2014-08-31 ENCOUNTER — Encounter: Payer: Medicare Other | Admitting: Physical Therapy

## 2014-08-31 ENCOUNTER — Ambulatory Visit: Payer: Medicare Other | Admitting: Physical Therapy

## 2014-08-31 DIAGNOSIS — R269 Unspecified abnormalities of gait and mobility: Secondary | ICD-10-CM | POA: Diagnosis not present

## 2014-08-31 DIAGNOSIS — M5442 Lumbago with sciatica, left side: Secondary | ICD-10-CM | POA: Diagnosis present

## 2014-08-31 DIAGNOSIS — R531 Weakness: Secondary | ICD-10-CM | POA: Diagnosis not present

## 2014-08-31 DIAGNOSIS — R209 Unspecified disturbances of skin sensation: Secondary | ICD-10-CM | POA: Diagnosis not present

## 2014-08-31 NOTE — Therapy (Signed)
[facility] [address] [place], [place], [address] Phone: [phone]   Fax:  [phone]  Patient Details  Name: Kristen Pham MRN: [id number] Date of Birth: 05-04-1924 Referring Provider:  No ref. provider found  Encounter Date: 08/31/2014  PHYSICAL THERAPY DISCHARGE SUMMARY  Visits from Start of Care: 9  Current functional level related to goals / functional outcomes:     PT Long Term Goals - 07/13/14 1652    PT LONG TERM GOAL #1   Title Pt will increase gait speed from 2.59 ft/sec to 3.19 ft/sec to indicate improved efficiency of ambulation. Target: 08/27/14.   Time 8   Period Weeks   Status On-going   PT LONG TERM GOAL #2   Title Pt will decrease Modified Oswestry score by 6 points from baseline to indicate significant decrease in symptom-related quality of life. Target: 08/27/14.   Baseline Baseline: 14%.   Time 8   Period Weeks   Status On-going   PT LONG TERM GOAL #3   Title Pt will ambulate 1,000' over unlevel, outdoor surfaces without LOB or need for rest break due to pain/symptoms in lower back or LE's to indicate progress toward PLOF. Target: 08/27/14.   Time 8   Period Weeks   Status On-going   PT LONG TERM GOAL #4   Title Pt will demonstrate 2 techniques used to mitigate LE numbness to indicate pt ability to self-manage symptoms. Target: 08/27/14.   Time 8   Period Weeks   Status On-going   PT LONG TERM GOAL #5   Title Pt will report 2-point decrease in within-session pain rating to facilitate improved quality of life. Target: 08/27/14.   Time 8   Period Weeks   Status On-going       Remaining deficits: Unknown, as patient has not been present for past 4 PT appointments.   Education / Equipment: Pt educated on HEP with focus on lumbar stabilization and centralization of peripheral symptoms (LLE numbness). Pt responded well to this program and exhibited effective carryover of education provided.   Plan: Patient agrees to discharge.  Patient goals were not met. Patient is being discharged due to not returning since the last visit.  ?????   Attempted to contact patient twice within the past week to notify of missed PT appointments. Left voicemail due to no answer.  Pt discharged, as plan of care expired today.                   Billie Ruddy, PT, DPT [facility] [address] [place], [place], [address] Phone: [phone]   Fax:  [phone] 08/31/2014, 8:24 AM

## 2015-01-21 ENCOUNTER — Other Ambulatory Visit: Payer: Self-pay | Admitting: *Deleted

## 2015-01-21 MED ORDER — TRAMADOL HCL 50 MG PO TABS: ORAL_TABLET | ORAL | Status: DC

## 2015-02-26 ENCOUNTER — Telehealth: Payer: Self-pay | Admitting: *Deleted

## 2015-02-26 NOTE — Telephone Encounter (Addendum)
Kristen Pham states she has previously been a pt of Dr. Beverlee Nims, and would like to see him for a painful corn between her 2nd and 3rd toes.  02/27/2015-PT CALLED AGAIN request an appt with Dr. Charlsie Merles to have a corn taken care of.

## 2015-03-04 ENCOUNTER — Encounter: Payer: Self-pay | Admitting: Podiatry

## 2015-03-04 ENCOUNTER — Ambulatory Visit (INDEPENDENT_AMBULATORY_CARE_PROVIDER_SITE_OTHER): Payer: Medicare Other | Admitting: Podiatry

## 2015-03-04 DIAGNOSIS — M2042 Other hammer toe(s) (acquired), left foot: Secondary | ICD-10-CM | POA: Diagnosis not present

## 2015-03-04 DIAGNOSIS — L84 Corns and callosities: Secondary | ICD-10-CM | POA: Diagnosis not present

## 2015-03-04 NOTE — Progress Notes (Signed)
Subjective:     Patient ID: Kristen Pham, female   DOB: 27-Oct-1924, 82 y.o.   MRN: [id number]  HPI patient presents stating 7 a lot of pain between the second and third toes left foot and wants to know what can be done to these toes long-term   Review of Systems     Objective:   Physical Exam  neurovascular status unchanged with keratotic lesion between the second and third toe left that are painful and makes shoe gear difficult with elevation of the toe in rigid contracture    Assessment:      hammertoe deformity second digit left foot with keratotic lesion between the second and third toes that's inflamed and painful    Plan:      reviewed conditions and at this time discussed digital corrections which may be necessary. Today using sharp instrumentation debridement accomplished padding applied and reappoint to recheck

## 2015-07-26 ENCOUNTER — Telehealth: Payer: Self-pay | Admitting: Cardiovascular Disease

## 2015-07-26 NOTE — Telephone Encounter (Signed)
Received [facility] for appointment on 08/06/15 with Dr Kirke CorinArida.  [facility] Dr Jari SportsmanArida's schedule on 08/06/15. lp

## 2015-08-01 ENCOUNTER — Ambulatory Visit (INDEPENDENT_AMBULATORY_CARE_PROVIDER_SITE_OTHER): Payer: Medicare Other | Admitting: Sports Medicine

## 2015-08-01 ENCOUNTER — Encounter: Payer: Self-pay | Admitting: Sports Medicine

## 2015-08-01 VITALS — BP 116/98 | Ht 61.0 in | Wt 116.0 lb

## 2015-08-01 DIAGNOSIS — M159 Polyosteoarthritis, unspecified: Secondary | ICD-10-CM | POA: Diagnosis not present

## 2015-08-01 DIAGNOSIS — M170 Bilateral primary osteoarthritis of knee: Secondary | ICD-10-CM | POA: Diagnosis not present

## 2015-08-01 MED ORDER — TRAMADOL HCL 50 MG PO TABS: ORAL_TABLET | ORAL | Status: DC

## 2015-08-01 NOTE — Progress Notes (Signed)
Patient ID: Angelica RanAurora Venables, female   DOB: 01/07/1925, 82 y.o.   MRN: [id number] CC: arthritis pain  Patient returns for discussion of arthritis pain She has significant arthritis in her hands but this is not particularly painful Right knee arthritis is painful The right knee swells periodically She is getting Synvisc injections at the orthopedic office  injections have reduced her swelling and reduced her pain by about 50%  As for other joints including shoulder and spine wine  One and sometimes 2 tramadol per day will control her pain  Review of systems Denies sciatic symptoms Less hip knee and back pain if she walks with a cane  Physical examination Pleasant elderly female in no acute distress BP 116/98 mmHg  Ht 5\' 1"  (1.549 m)  Wt 116 lb (52.617 kg)  BMI 21.93 kg/m2  Walking gait is slightly broad-based but is very steady particularly when she uses her cane Shoulder motion remains good and is pain free Hands show multiple deviations a Osler and Heberdeens nodes CMC and MCP DJD  RT knee with deg changes  Hip ROM is good

## 2015-08-01 NOTE — Assessment & Plan Note (Signed)
Stable today without much pain

## 2015-08-01 NOTE — Assessment & Plan Note (Signed)
She does not want aggressive treatment  After this series of injections I suggested she continue on the tramadol Increase this to twice a day  Take an extra tramadol if she's having more pain  Seems effective in allowing her to stay active and walk  Recheck when necessary

## 2015-08-06 ENCOUNTER — Ambulatory Visit: Payer: Medicare Other | Admitting: Cardiovascular Disease

## 2015-09-17 ENCOUNTER — Ambulatory Visit: Payer: Medicare Other | Admitting: Cardiovascular Disease

## 2015-12-10 ENCOUNTER — Ambulatory Visit (INDEPENDENT_AMBULATORY_CARE_PROVIDER_SITE_OTHER): Payer: Medicare Other | Admitting: Cardiovascular Disease

## 2015-12-10 VITALS — BP 136/60 | HR 69 | Ht 61.0 in | Wt 110.6 lb

## 2015-12-10 DIAGNOSIS — I10 Essential (primary) hypertension: Secondary | ICD-10-CM

## 2015-12-10 DIAGNOSIS — M79605 Pain in left leg: Secondary | ICD-10-CM

## 2015-12-10 DIAGNOSIS — E7849 Other hyperlipidemia: Secondary | ICD-10-CM

## 2015-12-10 DIAGNOSIS — E784 Other hyperlipidemia: Secondary | ICD-10-CM

## 2015-12-10 NOTE — Progress Notes (Signed)
Cardiology Office Note   Date:  12/10/2015   ID:  Kristen Pham, DOB 01/27/1924, MRN [id number]  PCP:  Gwen PoundsUSSO,JOHN M, MD  Cardiologist:   Lorine BearsMuhammad Arida, MD   No chief complaint on file.     History of Present Illness: Kristen Pham is a 82 y.o. female who was referred by Dr. Timothy Lassousso for evaluation of left leg pain and possible PAD. 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 She has no previous cardiac history but has been diagnosed with peripheral arterial disease with mildly reduced ABI when she lived in [place]. She never had any angiography or lower extremity revascularization. She is not diabetic and does not smoke. She has known history of hypertension, hyperlipidemia and arthritis. She complains of a long history of left leg discomfort mostly affecting the calf and foot. This is described as cramping that happens mostly at night and at rest. It does not happen with walking. She does have knee arthritis which affects her ability to walk. She denies any chest pain or shortness of breath. She has no lower extremity ulceration. She did have left leg cellulitis in July but that has healed completely. She denies any back discomfort.    Past Medical History:  Diagnosis Date  . Carpal tunnel syndrome   . Hypertension     Past Surgical History:  Procedure Laterality Date  . ABDOMINAL HYSTERECTOMY    . CESAREAN SECTION    . HAND SURGERY     bilateral release   . TONSILECTOMY, ADENOIDECTOMY, BILATERAL MYRINGOTOMY AND TUBES       Current Outpatient Prescriptions  Medication Sig Dispense Refill  . amLODipine (NORVASC) 10 MG tablet Take 10 mg by mouth daily.    Marland Kitchen. atenolol (TENORMIN) 50 MG tablet Take 50 mg by mouth daily.    . calcium-vitamin D (OSCAL WITH D) 500-200 MG-UNIT per tablet Take 2 tablets by mouth.    . fluocinonide cream (LIDEX) 0.05 % as needed.    Marland Kitchen. glucosamine-chondroitin 500-400 MG tablet Take 1 tablet by mouth daily.    . traMADol (ULTRAM)  50 MG tablet Take one tablet twice a day as needed for pain and ok to refill every 3 months 180 tablet 3   No current facility-administered medications for this visit.     Allergies:   Ivp dye [iodinated diagnostic agents]    Social History:  The patient  reports that she has never smoked. She has never used smokeless tobacco. She reports that she drinks alcohol. She reports that she does not use drugs.   Family History:  The patient's family history includes Cerebral aneurysm in her father.    ROS:  Please see the history of present illness.   Otherwise, review of systems are positive for none.   All other systems are reviewed and negative.    PHYSICAL EXAM: VS:  BP 136/60   Pulse 69   Ht 5\' 1"  (1.549 m)   Wt 110 lb 9.6 oz (50.2 kg)   BMI 20.90 kg/m  , BMI Body mass index is 20.9 kg/m. GEN: Well nourished, well developed, in no acute distress  HEENT: normal  Neck: no JVD, carotid bruits, or masses Cardiac: RRR; no rubs, or gallops,no edema . One out of 6 systolic ejection murmur in the aortic area Respiratory:  clear to auscultation bilaterally, normal work of breathing GI: soft, nontender, nondistended, + BS. No abdominal bruit MS: no deformity or atrophy  Skin: warm and dry, no rash Neuro:  Strength and sensation are intact Psych: euthymic mood, full affect Vascular: Femoral pulses are normal bilaterally. Dorsalis pedis +1 on the right and absent on the left. Circumflex tibial is +1 bilaterally.  EKG:  EKG is ordered today. The ekg ordered today demonstrates sinus rhythm with sinus arrhythmia with moderate LVH criteria.   Recent Labs: No results found for requested labs within last 8760 hours.    Lipid Panel No results found for: CHOL, TRIG, HDL, CHOLHDL, VLDL, LDLCALC, LDLDIRECT    Wt Readings from Last 3 Encounters:  12/10/15 110 lb 9.6 oz (50.2 kg)  08/01/15 116 lb (52.6 kg)  08/20/14 115 lb 6.4 oz (52.3 kg)       No flowsheet data  found.    ASSESSMENT AND PLAN:  1.  Left leg pain: Although the patient has reported history of mild peripheral arterial disease, her left leg pain is not consistent with claudication. It's mostly happening at night and at rest and is not reproducible with walking and physical activities. Her distal pulses are diminished but palpable. I requested lower extremity arterial Doppler to evaluate. Her symptoms might be related to peripheral neuropathy and she might benefit from nighttime gabapentin.  2. Essential hypertension: Blood pressure is well controlled on current medications.  3. Hyperlipidemia: Currently not on atorvastatin due to cost.    Disposition:   FU with me as needed.   Signed,  Lorine BearsMuhammad Arida, MD  12/10/2015 3:32 PM    [facility]

## 2015-12-10 NOTE — Patient Instructions (Signed)
Medication Instructions:  Your physician recommends that you continue on your current medications as directed. Please refer to the Current Medication list given to you today.  Labwork: No new orders.   Testing/Procedures: Your physician has requested that you have a lower extremity arterial doppler. This test is an ultrasound of the arteries in the legs. It looks at arterial blood flow in the legs. Allow one hour for Lower Arterial scans. There are no restrictions or special instructions  Follow-Up: Your physician recommends that you schedule a follow-up appointment as needed with Dr Arida.    Any Other Special Instructions Will Be Listed Below (If Applicable).     If you need a refill on your cardiac medications before your next appointment, please call your pharmacy.   

## 2015-12-31 ENCOUNTER — Other Ambulatory Visit: Payer: Self-pay | Admitting: Cardiovascular Disease

## 2015-12-31 DIAGNOSIS — M79605 Pain in left leg: Secondary | ICD-10-CM

## 2016-01-08 ENCOUNTER — Ambulatory Visit (HOSPITAL_COMMUNITY)
Admission: RE | Admit: 2016-01-08 | Discharge: 2016-01-08 | Disposition: A | Discharge status: Home / Self Care | Payer: Medicare Other | Source: Ambulatory Visit | Attending: Cardiovascular Disease | Admitting: Cardiovascular Disease

## 2016-01-08 DIAGNOSIS — I739 Peripheral vascular disease, unspecified: Secondary | ICD-10-CM | POA: Diagnosis not present

## 2016-01-08 DIAGNOSIS — R9439 Abnormal result of other cardiovascular function study: Secondary | ICD-10-CM | POA: Insufficient documentation

## 2016-01-08 DIAGNOSIS — I700 Atherosclerosis of aorta: Secondary | ICD-10-CM | POA: Diagnosis not present

## 2016-01-08 DIAGNOSIS — I743 Embolism and thrombosis of arteries of the lower extremities: Secondary | ICD-10-CM | POA: Diagnosis not present

## 2016-01-08 DIAGNOSIS — R0989 Other specified symptoms and signs involving the circulatory and respiratory systems: Secondary | ICD-10-CM | POA: Diagnosis not present

## 2016-01-08 DIAGNOSIS — M79605 Pain in left leg: Secondary | ICD-10-CM | POA: Diagnosis not present

## 2016-01-29 ENCOUNTER — Telehealth: Payer: Self-pay | Admitting: Cardiovascular Disease

## 2016-01-29 NOTE — Telephone Encounter (Signed)
New Message   Pt has some questions, and would like a call back from Lauren.

## 2016-01-29 NOTE — Telephone Encounter (Signed)
Spoke to patient. Notes she had a gift to bring for Dr. Kirke CorinArida and wanted to bring on a day he was in office. Aware he is in office on Tuesdays. She wanted to speak to lauren to see if there would be a good time to come in. She informed me she wanted to speak to Dr. Kirke CorinArida when she came, but didn't specify the concern. She identifies no acute complaints or symptoms. Informed pt I would route msg - she voiced thanks.

## 2016-01-30 ENCOUNTER — Other Ambulatory Visit: Payer: Self-pay | Admitting: *Deleted

## 2016-01-30 MED ORDER — TRAMADOL HCL 50 MG PO TABS: ORAL_TABLET | ORAL | 3 refills | Status: DC

## 2016-01-30 NOTE — Telephone Encounter (Signed)
I spoke with the pt and she plans to come into the office on 02/04/16 around 11 AM to give Dr Kirke CorinArida a gift.

## 2016-03-10 ENCOUNTER — Encounter: Payer: Self-pay | Admitting: Sports Medicine

## 2016-03-10 ENCOUNTER — Ambulatory Visit (INDEPENDENT_AMBULATORY_CARE_PROVIDER_SITE_OTHER): Payer: Medicare Other | Admitting: Sports Medicine

## 2016-03-10 ENCOUNTER — Telehealth: Payer: Self-pay | Admitting: Sports Medicine

## 2016-03-10 DIAGNOSIS — M2042 Other hammer toe(s) (acquired), left foot: Secondary | ICD-10-CM

## 2016-03-10 DIAGNOSIS — M779 Enthesopathy, unspecified: Secondary | ICD-10-CM | POA: Diagnosis not present

## 2016-03-10 DIAGNOSIS — M2041 Other hammer toe(s) (acquired), right foot: Secondary | ICD-10-CM

## 2016-03-10 DIAGNOSIS — M21619 Bunion of unspecified foot: Secondary | ICD-10-CM

## 2016-03-10 DIAGNOSIS — L84 Corns and callosities: Secondary | ICD-10-CM

## 2016-03-10 MED ORDER — TRIAMCINOLONE ACETONIDE 10 MG/ML IJ SUSP: 10.0000 mg | Freq: Once | INTRAMUSCULAR | Status: DC

## 2016-03-10 MED ORDER — TRIAMCINOLONE ACETONIDE 0.1 % EX CREA: TOPICAL_CREAM | CUTANEOUS | 0 refills | Status: DC

## 2016-03-10 NOTE — Telephone Encounter (Signed)
Pt called and [facility] did not call pt about the cream she is to be putting on foot. Can you make sure [facility] [address]

## 2016-03-10 NOTE — Progress Notes (Signed)
  Subjective: Kristen Pham is a 83 y.o. female patient who presents to office for evaluation of 2nd toe pain on Left foot pain secondary to callus skin. Patient complains of pain at the lesion present, had it trimmed last visit but still is painful and wants to discuss treatment options. Patient denies any other pedal complaints.   Patient Active Problem List   Diagnosis Date Noted  . 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 Knee pain, bilateral 06/21/2012  . Primary osteoarthritis of both knees 06/21/2012  . Generalized osteoarthritis of hand 06/21/2012    Current Outpatient Prescriptions on File Prior to Visit  Medication Sig Dispense Refill  . amLODipine (NORVASC) 10 MG tablet Take 10 mg by mouth daily.    Marland Kitchen. atenolol (TENORMIN) 50 MG tablet Take 50 mg by mouth daily.    . calcium-vitamin D (OSCAL WITH D) 500-200 MG-UNIT per tablet Take 2 tablets by mouth.    . fluocinonide cream (LIDEX) 0.05 % as needed.    Marland Kitchen. glucosamine-chondroitin 500-400 MG tablet Take 1 tablet by mouth daily.    . traMADol (ULTRAM) 50 MG tablet Take one tablet twice a day as needed for pain and ok to refill every 3 months 180 tablet 3   No current facility-administered medications on file prior to visit.     Allergies  Allergen Reactions  . Ivp Dye [Iodinated Diagnostic Agents]     Felt confused, disoriented    Objective:  General: Alert and oriented x3 in no acute distress  Dermatology: Keratotic lesion present lateral 2nd toe on left with skin lines transversing the lesion, pain is present with direct pressure to the lesion with a central nucleated core noted, no webspace macerations, no ecchymosis bilateral, all nails x 10 are well manicured.  Vascular: Dorsalis Pedis and Posterior Tibial pedal pulses 1/4, Capillary Fill Time 3 seconds, + scant pedal hair growth bilateral, no edema bilateral lower extremities, Temperature gradient within normal limits.  Neurology: Michaell CowingGross sensation intact via light touch  bilateral.  Musculoskeletal: Mild tenderness with palpation at the keratotic lesion site on Left 2nd toe with hammertoe and bunion present, Muscular strength 5/5 in all groups without pain on range of motion.  Assessment and Plan: Problem List Items Addressed This Visit    None    Visit Diagnoses    Corns and callosities    -  Primary   Left 2nd toe lateral aspect   Relevant Medications   triamcinolone acetonide (KENALOG) 10 MG/ML injection 10 mg (Start on 03/10/2016  1:30 PM)   Capsulitis       Relevant Medications   triamcinolone acetonide (KENALOG) 10 MG/ML injection 10 mg (Start on 03/10/2016  1:30 PM)   Hammer toes of both feet       Bunion         -Complete examination performed -Discussed treatment options After oral consent and aseptic prep, injected a mixture containing 1 ml of 2% plain lidocaine, 1 ml 0.5% plain marcaine, 0.5 ml of kenalog 10 and 0.5 ml of dexamethasone phosphate into left 2nd toe without complication. Post-injection care discussed with patient.  -Parred keratoic lesion using a chisel blade -Dispensed toe spacers -Advised good supportive shoes and inserts -Recommend patient to consider surgery if fail to improve  -Patient to return to office as needed or sooner if condition worsens.  Asencion Islamitorya Takashi Korol, DPM

## 2016-04-04 ENCOUNTER — Encounter (HOSPITAL_COMMUNITY): Payer: Self-pay | Admitting: Emergency Medicine

## 2016-04-04 ENCOUNTER — Emergency Department (HOSPITAL_COMMUNITY): Payer: Medicare Other

## 2016-04-04 ENCOUNTER — Emergency Department (HOSPITAL_COMMUNITY)
Admission: EM | Admit: 2016-04-04 | Discharge: 2016-04-04 | Disposition: A | Discharge status: Home / Self Care | Payer: Medicare Other | Attending: Emergency Medicine | Admitting: Emergency Medicine

## 2016-04-04 DIAGNOSIS — Z79899 Other long term (current) drug therapy: Secondary | ICD-10-CM | POA: Diagnosis not present

## 2016-04-04 DIAGNOSIS — H2102 Hyphema, left eye: Secondary | ICD-10-CM | POA: Diagnosis not present

## 2016-04-04 DIAGNOSIS — K625 Hemorrhage of anus and rectum: Secondary | ICD-10-CM | POA: Insufficient documentation

## 2016-04-04 DIAGNOSIS — I10 Essential (primary) hypertension: Secondary | ICD-10-CM | POA: Diagnosis not present

## 2016-04-04 LAB — BASIC METABOLIC PANEL
ANION GAP: 8 (ref 5–15)
BUN: 18 mg/dL (ref 6–20)
CO2: 27 mmol/L (ref 22–32)
Calcium: 9.3 mg/dL (ref 8.9–10.3)
Chloride: 105 mmol/L (ref 101–111)
Creatinine, Ser: 0.7 mg/dL (ref 0.44–1.00)
GFR calc Af Amer: >60 mL/min (ref >60)
GFR calc non Af Amer: >60 mL/min (ref >60)
GLUCOSE: 95 mg/dL (ref 65–99)
POTASSIUM: 3.9 mmol/L (ref 3.5–5.1)
Sodium: 140 mmol/L (ref 135–145)

## 2016-04-04 LAB — POC OCCULT BLOOD, ED: Fecal Occult Bld: POSITIVE — AB

## 2016-04-04 LAB — CBC WITH DIFFERENTIAL/PLATELET
BASOS ABS: 0 10*3/uL (ref 0.0–0.1)
Basophils Relative: 1 %
EOS PCT: 2 %
Eosinophils Absolute: 0.1 10*3/uL (ref 0.0–0.7)
HEMATOCRIT: 41.9 % (ref 36.0–46.0)
Hemoglobin: 14 g/dL (ref 12.0–15.0)
LYMPHS ABS: 1.3 10*3/uL (ref 0.7–4.0)
Lymphocytes Relative: 22 %
MCH: 30.6 pg (ref 26.0–34.0)
MCHC: 33.4 g/dL (ref 30.0–36.0)
MCV: 91.5 fL (ref 78.0–100.0)
Monocytes Absolute: 0.3 10*3/uL (ref 0.1–1.0)
Monocytes Relative: 5 %
Neutro Abs: 4.1 10*3/uL (ref 1.7–7.7)
Neutrophils Relative %: 70 %
Platelets: 231 10*3/uL (ref 150–400)
RBC: 4.58 MIL/uL (ref 3.87–5.11)
RDW: 13 % (ref 11.5–15.5)
WBC: 5.9 10*3/uL (ref 4.0–10.5)

## 2016-04-04 MED ORDER — HYDROCORTISONE ACETATE 25 MG RE SUPP: 25.0000 mg | Freq: Two times a day (BID) | RECTAL | 0 refills | Status: DC

## 2016-04-04 NOTE — ED Provider Notes (Signed)
[facility]-EMERGENCY DEPT Provider Note   CSN: [id number] Arrival date & time: 04/04/16  1157     History   Chief Complaint Chief Complaint  Patient presents with  . Rectal Bleeding  . Hemorrhoids  . subconjunctival hematoma    HPI Leatha Rohner is a 83 y.o. female.  1 small episode of rectal bleeding this morning described as a light redness on her toilet paper. No abdominal pain, vomiting, diarrhea. No previous rectal bleeding. Last colonoscopy 1981. Review systems positive for redness in her left eye. Severity symptoms is mild. Nothing makes symptoms better or worse.      Past Medical History:  Diagnosis Date  . Carpal tunnel syndrome   . Hypertension     Patient Active Problem List   Diagnosis Date Noted  . 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 Knee pain, bilateral 06/21/2012  . Primary osteoarthritis of both knees 06/21/2012  . Generalized osteoarthritis of hand 06/21/2012    Past Surgical History:  Procedure Laterality Date  . ABDOMINAL HYSTERECTOMY    . CESAREAN SECTION    . HAND SURGERY     bilateral release   . TONSILECTOMY, ADENOIDECTOMY, BILATERAL MYRINGOTOMY AND TUBES      OB History    No data available       Home Medications    Prior to Admission medications   Medication Sig Start Date End Date Taking? Authorizing Provider  amLODipine (NORVASC) 10 MG tablet Take 10 mg by mouth daily. 03/29/12   Historical Provider, MD  atenolol (TENORMIN) 50 MG tablet Take 50 mg by mouth daily. 03/29/12   Historical Provider, MD  calcium-vitamin D (OSCAL WITH D) 500-200 MG-UNIT per tablet Take 2 tablets by mouth.    Historical Provider, MD  fluocinonide cream (LIDEX) 0.05 % as needed. 05/30/12   Historical Provider, MD  glucosamine-chondroitin 500-400 MG tablet Take 1 tablet by mouth daily.    Historical Provider, MD  hydrocortisone (ANUCORT-HC) 25 MG suppository Place 1 suppository (25 mg total) rectally 2 (two) times daily. 04/04/16   Donnetta Hutching, MD  traMADol Janean Sark)  50 MG tablet Take one tablet twice a day as needed for pain and ok to refill every 3 months 01/30/16   Enid Baas, MD  triamcinolone cream (KENALOG) 0.1 % Apply to affected area daily. 03/10/16   Asencion Islam, DPM    Family History Family History  Problem Relation Age of Onset  . Cerebral aneurysm Father     Social History Social History  Substance Use Topics  . Smoking status: Never Smoker  . Smokeless tobacco: Never Used  . Alcohol use 0.0 oz/week     Comment: occasionally      Allergies   Ivp dye [iodinated diagnostic agents]   Review of Systems Review of Systems  All other systems reviewed and are negative.    Physical Exam Updated Vital Signs BP (!) 142/59   Pulse 65   Temp 98.2 F (36.8 C) (Oral)   Resp 17   Ht 5\' 1"  (1.549 m)   Wt 116 lb (52.6 kg)   SpO2 100%   BMI 21.92 kg/m   Physical Exam  Constitutional: She is oriented to person, place, and time. She appears well-developed and well-nourished.  HENT:  Head: Normocephalic and atraumatic.  Eyes:  Area of conjunctival erythema in the left eye from approximately 5-11 o'clock  Neck: Neck supple.  Cardiovascular: Normal rate and regular rhythm.   Pulmonary/Chest: Effort normal and breath sounds normal.  Abdominal: Soft. Bowel sounds are normal.  Genitourinary:  Genitourinary Comments: Rectal exam: No masses. Minimal amount of light red blood on glove.  Musculoskeletal: Normal range of motion.  Neurological: She is alert and oriented to person, place, and time.  Skin: Skin is warm and dry.  Psychiatric: She has a normal mood and affect. Her behavior is normal.  Nursing note and vitals reviewed.    ED Treatments / Results  Labs (all labs ordered are listed, but only abnormal results are displayed) Labs Reviewed  POC OCCULT BLOOD, ED - Abnormal; Notable for the following:       Result Value   Fecal Occult Bld POSITIVE (*)    All other components within normal limits  CBC WITH  DIFFERENTIAL/PLATELET  BASIC METABOLIC PANEL    EKG  EKG Interpretation None       Radiology Dg Abdomen Acute W/chest  Result Date: 04/04/2016 CLINICAL DATA:  Blood in stool this morning with abdominal pain intermittently over the lower abdomen. EXAM: DG ABDOMEN ACUTE W/ 1V CHEST COMPARISON:  None. FINDINGS: Lungs are adequately inflated without airspace consolidation or effusion. Cardiomediastinal silhouette is within normal. There is calcified plaque over the aortic arch. There are degenerative changes of the spine. Abdominopelvic images demonstrate a nonobstructive bowel gas pattern with mild fecal retention throughout the colon. There are several air-filled nondilated small bowel loops. Suggestion of moderate cholelithiasis. Moderate degenerate change of the spine and mild degenerate change of the hips. Several left pelvic phleboliths are present. IMPRESSION: No acute cardiopulmonary disease. Nonobstructive bowel gas pattern with mild fecal retention throughout the colon. Findings suggesting moderate cholelithiasis. Electronically Signed   By: Elberta Fortisaniel  Boyle M.D.   On: 04/04/2016 13:52    Procedures Procedures (including critical care time)  Medications Ordered in ED Medications - No data to display   Initial Impression / Assessment and Plan / ED Course  I have reviewed the triage vital signs and the nursing notes.  Pertinent labs & imaging results that were available during my care of the patient were reviewed by me and considered in my medical decision making (see chart for details).     Patient is hemodynamically stable. No further episodes of rectal bleeding. These findings were discussed with the patient and her daughter. She [facility].  Prescription for Anusol suppository. I also discussed the diagnosis of hyphema with the patient.  Final Clinical Impressions(s) / ED Diagnoses   Final diagnoses:  Rectal bleeding  Hyphema of left eye     New Prescriptions New Prescriptions   HYDROCORTISONE (ANUCORT-HC) 25 MG SUPPOSITORY    Place 1 suppository (25 mg total) rectally 2 (two) times daily.     Donnetta HutchingBrian Caroll Weinheimer, MD 04/04/16 1538

## 2016-04-04 NOTE — Discharge Instructions (Signed)
Tests were good. No new recommendations for the left eye. I will prescribe a suppository for your bleeding. If symptoms do not improve, recommend follow-up with a gastroenterologist.  Kristen Pham can always return to the emergency department for severe bleeding

## 2016-04-04 NOTE — ED Triage Notes (Signed)
Pt had bright red bleeding from rectum this am after hard stool, also has reddened conjunctiva, that started this am.

## 2016-04-04 NOTE — ED Notes (Signed)
Pt verbalized understanding discharge instructions and denies any further needs or questions at this time. VS stable, ambulatory and steady gait.   

## 2016-04-04 NOTE — ED Notes (Signed)
Awaiting doctor to go over results, pt updated 

## 2016-04-10 ENCOUNTER — Other Ambulatory Visit: Payer: Self-pay | Admitting: Internal Medicine

## 2016-04-10 DIAGNOSIS — N644 Mastodynia: Secondary | ICD-10-CM

## 2016-05-12 ENCOUNTER — Encounter: Payer: Self-pay | Admitting: Sports Medicine

## 2016-05-12 ENCOUNTER — Ambulatory Visit (INDEPENDENT_AMBULATORY_CARE_PROVIDER_SITE_OTHER): Payer: Medicare Other | Admitting: Sports Medicine

## 2016-05-12 DIAGNOSIS — M779 Enthesopathy, unspecified: Secondary | ICD-10-CM | POA: Diagnosis not present

## 2016-05-12 DIAGNOSIS — L84 Corns and callosities: Secondary | ICD-10-CM

## 2016-05-12 DIAGNOSIS — M2042 Other hammer toe(s) (acquired), left foot: Secondary | ICD-10-CM

## 2016-05-12 DIAGNOSIS — M2041 Other hammer toe(s) (acquired), right foot: Secondary | ICD-10-CM

## 2016-05-12 MED ORDER — TRIAMCINOLONE ACETONIDE 10 MG/ML IJ SUSP: 10.0000 mg | Freq: Once | INTRAMUSCULAR | Status: AC

## 2016-05-12 NOTE — Progress Notes (Signed)
Subjective: Kristen Pham is a 83 y.o. female patient who returns to office for evaluation of pain secondary to callus skin at left 2nd toe>right 3rd toe. Patient states that the trimmed lasted about 2 months before started to be tender again and states that she forgets or loses the spacers. Patient denies any other pedal complaints.   Patient Active Problem List   Diagnosis Date Noted  . 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 Knee pain, bilateral 06/21/2012  . Primary osteoarthritis of both knees 06/21/2012  . Generalized osteoarthritis of hand 06/21/2012    Current Outpatient Prescriptions on File Prior to Visit  Medication Sig Dispense Refill  . amLODipine (NORVASC) 10 MG tablet Take 10 mg by mouth daily.    Marland Kitchen atenolol (TENORMIN) 50 MG tablet Take 50 mg by mouth daily.    . calcium-vitamin D (OSCAL WITH D) 500-200 MG-UNIT per tablet Take 2 tablets by mouth.    . fluocinonide cream (LIDEX) 0.05 % as needed.    Marland Kitchen glucosamine-chondroitin 500-400 MG tablet Take 1 tablet by mouth daily.    . hydrocortisone (ANUCORT-HC) 25 MG suppository Place 1 suppository (25 mg total) rectally 2 (two) times daily. 14 suppository 0  . traMADol (ULTRAM) 50 MG tablet Take one tablet twice a day as needed for pain and ok to refill every 3 months 180 tablet 3  . triamcinolone cream (KENALOG) 0.1 % Apply to affected area daily. 30 g 0   No current facility-administered medications on file prior to visit.     Allergies  Allergen Reactions  . Ivp Dye [Iodinated Diagnostic Agents]     Felt confused, disoriented    Objective:  General: Alert and oriented x3 in no acute distress  Dermatology: Keratotic lesion present lateral 2nd toe on left> right 3rd toe with skin lines transversing the lesion, pain is present with direct pressure to the lesion with a central nucleated core noted, no webspace macerations, no ecchymosis bilateral, all nails x 10 are well manicured.  Vascular: Dorsalis Pedis and Posterior Tibial  pedal pulses 1/4, Capillary Fill Time 3 seconds, + scant pedal hair growth bilateral, no edema bilateral lower extremities, Temperature gradient within normal limits.  Neurology: Michaell Cowing sensation intact via light touch bilateral.  Musculoskeletal: Mild tenderness with palpation at the keratotic lesion site on Left 2nd> right 3rd toe with hammertoe and bunion present, Muscular strength 5/5 in all groups without pain on range of motion.  Assessment and Plan: Problem List Items Addressed This Visit    None    Visit Diagnoses    Corns and callosities    -  Primary   Relevant Medications   triamcinolone acetonide (KENALOG) 10 MG/ML injection 10 mg   Capsulitis       Relevant Medications   triamcinolone acetonide (KENALOG) 10 MG/ML injection 10 mg   Hammer toes of both feet         -Complete examination performed -Discussed treatment options After oral consent and aseptic prep, injected a mixture containing 1 ml of 2% plain lidocaine, 1 ml 0.5% plain marcaine, 0.5 ml of kenalog 10 and 0.5 ml of dexamethasone phosphate into left 2nd toe without complication. Post-injection care discussed with patient.  -Parred keratoic lesions using a chisel blade at left 2nd and right 3rd toe  -Advised patient to use antibiotic cream and bandaid at injection site to prevent infection at left 2nd toe -Continue with toe spacers -Advised good supportive shoes and inserts -Recommend patient to consider surgery if fail to improve; pt declines  at this time and wants to continue with trimmings.  -Patient to return to office in 2 months or sooner if condition worsens.  Kristen Pham, DPM

## 2016-06-18 ENCOUNTER — Encounter: Payer: Self-pay | Admitting: Podiatry

## 2016-06-18 ENCOUNTER — Ambulatory Visit (INDEPENDENT_AMBULATORY_CARE_PROVIDER_SITE_OTHER): Payer: Medicare Other | Admitting: Podiatry

## 2016-06-18 DIAGNOSIS — L97511 Non-pressure chronic ulcer of other part of right foot limited to breakdown of skin: Secondary | ICD-10-CM | POA: Diagnosis not present

## 2016-06-18 NOTE — Progress Notes (Signed)
This patient presents the office with chief complaint of a painful second and third toe on her left foot. She says she has been previously treated with debridement and even antibiotics for infections. She says the corns keep returning. She says she was applying cortisone cream prescribed by Dr. Marylene LandStover to the outside of the second toe left foot.  She also says that the second toe was very painful and was treated with injection therapy in April.  She presents the office today saying there is extreme pain on the outside of the second toe left foot. She presents the office today for an evaluation of her she describes as an infected corn  GENERAL APPEARANCE: Alert, conversant. Appropriately groomed. No acute distress.  VASCULAR: Pedal pulses are  palpable at  [facility] bilateral.  Capillary refill time is immediate to all digits,  Normal temperature gradient.  Digital hair growth is present bilateral  NEUROLOGIC: sensation is normal to 5.07 monofilament at 5/5 sites bilateral.  Light touch is intact bilateral, Muscle strength normal.  MUSCULOSKELETAL: acceptable muscle strength, tone and stability bilateral.  Severe hammer toes  B/l with left greater than right.    Rectus appearance of foot and digits noted bilateral.   DERMATOLOGIC: skin color, texture, and turgor are within normal limits except between 2,3 left foot. Examination of second toe reveals ulcer at the lateral aspect of second toe at the level of PIPJ. The ulcer is encircles by white necrotic tissue.  There is white necrotic tissue noted on the medial aspect third toe left foot.  Ulcer second toe left foot.  Hammer toes  B/L  ROV  debridement of necrotic tissue on the second toe of the left foot and was bandaged with Betadine and dry sterile dressing. There appears to be no evidence of any pus drainage or infection. Only ulceration of the skin down to the subcutaneous tissue.  Home instructions for soaks and bandaging was present. No antibiotics  ordered since. No infection was noted. My impression is that she accidentally applied acid instead of the cortisone cream to the skin causing a chemical burn on the inside of the second toe left foo RTC 10 days.  Helane GuntherGregory Dominyck Reser DPM

## 2016-06-19 ENCOUNTER — Telehealth: Payer: Self-pay | Admitting: *Deleted

## 2016-06-19 NOTE — Telephone Encounter (Addendum)
Pt states Dr. Stacie AcresMayer request name of the cream Dr. Marylene LandStover prescribed in April, pt spelled Triamcinolone cream.06/19/2016-I informed Dr. Stacie AcresMayer of pt's cream from Dr. Marylene LandStover and he stated pt was given instructions no other orders given. Pt called states she needs an antibiotic. Pt called and said she needed an antibiotic. I told pt I had reviewed her clinical and Dr. Stacie AcresMayer had said there was not infection, no antibiotic needed. I told pt to follow the instructions given by Dr. Stacie AcresMayer. Pt states she was told to soak, and asked what kind of soap. I told her Dial antibacterial soap and rinse well. Pt states she is going to get more neosporin and use as Dr. Stacie AcresMayer stated.

## 2016-06-22 ENCOUNTER — Other Ambulatory Visit: Payer: Self-pay | Admitting: Internal Medicine

## 2016-06-22 DIAGNOSIS — R42 Dizziness and giddiness: Secondary | ICD-10-CM

## 2016-06-22 DIAGNOSIS — Z87820 Personal history of traumatic brain injury: Secondary | ICD-10-CM

## 2016-06-22 NOTE — Telephone Encounter (Signed)
Why was this sent to me and not dr Stacie Acresmayer?

## 2016-06-25 ENCOUNTER — Ambulatory Visit
Admission: RE | Admit: 2016-06-25 | Discharge: 2016-06-25 | Disposition: A | Discharge status: Home / Self Care | Payer: Medicare Other | Source: Ambulatory Visit | Attending: Internal Medicine | Admitting: Internal Medicine

## 2016-06-25 DIAGNOSIS — R42 Dizziness and giddiness: Secondary | ICD-10-CM

## 2016-06-25 DIAGNOSIS — Z87820 Personal history of traumatic brain injury: Secondary | ICD-10-CM

## 2016-06-26 ENCOUNTER — Ambulatory Visit: Payer: Medicare Other | Admitting: Podiatry

## 2016-07-07 ENCOUNTER — Ambulatory Visit: Payer: Medicare Other | Admitting: Sports Medicine

## 2016-07-09 ENCOUNTER — Encounter (HOSPITAL_BASED_OUTPATIENT_CLINIC_OR_DEPARTMENT_OTHER): Payer: Medicare Other | Attending: Internal Medicine

## 2016-07-09 DIAGNOSIS — L97522 Non-pressure chronic ulcer of other part of left foot with fat layer exposed: Secondary | ICD-10-CM | POA: Insufficient documentation

## 2016-07-09 DIAGNOSIS — I10 Essential (primary) hypertension: Secondary | ICD-10-CM | POA: Diagnosis not present

## 2016-07-09 DIAGNOSIS — Z923 Personal history of irradiation: Secondary | ICD-10-CM | POA: Insufficient documentation

## 2016-07-13 ENCOUNTER — Encounter (HOSPITAL_BASED_OUTPATIENT_CLINIC_OR_DEPARTMENT_OTHER): Payer: Medicare Other

## 2016-07-23 ENCOUNTER — Encounter (HOSPITAL_BASED_OUTPATIENT_CLINIC_OR_DEPARTMENT_OTHER): Payer: Medicare Other | Attending: Internal Medicine

## 2016-07-23 DIAGNOSIS — I10 Essential (primary) hypertension: Secondary | ICD-10-CM | POA: Insufficient documentation

## 2016-07-23 DIAGNOSIS — L97522 Non-pressure chronic ulcer of other part of left foot with fat layer exposed: Secondary | ICD-10-CM | POA: Insufficient documentation

## 2016-07-23 DIAGNOSIS — Z923 Personal history of irradiation: Secondary | ICD-10-CM | POA: Diagnosis not present

## 2016-08-03 ENCOUNTER — Ambulatory Visit (HOSPITAL_COMMUNITY)
Admission: RE | Admit: 2016-08-03 | Discharge: 2016-08-03 | Disposition: A | Discharge status: Home / Self Care | Payer: Medicare Other | Source: Ambulatory Visit | Attending: Internal Medicine | Admitting: Internal Medicine

## 2016-08-03 ENCOUNTER — Other Ambulatory Visit: Payer: Self-pay | Admitting: Internal Medicine

## 2016-08-03 DIAGNOSIS — L97523 Non-pressure chronic ulcer of other part of left foot with necrosis of muscle: Secondary | ICD-10-CM | POA: Insufficient documentation

## 2016-08-03 DIAGNOSIS — L97522 Non-pressure chronic ulcer of other part of left foot with fat layer exposed: Secondary | ICD-10-CM | POA: Diagnosis not present

## 2016-08-03 DIAGNOSIS — M19072 Primary osteoarthritis, left ankle and foot: Secondary | ICD-10-CM | POA: Diagnosis not present

## 2016-08-03 DIAGNOSIS — M869 Osteomyelitis, unspecified: Secondary | ICD-10-CM

## 2016-08-10 DIAGNOSIS — L97522 Non-pressure chronic ulcer of other part of left foot with fat layer exposed: Secondary | ICD-10-CM | POA: Diagnosis not present

## 2016-08-18 ENCOUNTER — Other Ambulatory Visit: Payer: Self-pay

## 2016-08-18 MED ORDER — TRAMADOL HCL 50 MG PO TABS: ORAL_TABLET | ORAL | 0 refills | Status: DC

## 2016-08-20 ENCOUNTER — Other Ambulatory Visit: Payer: Self-pay

## 2016-08-20 DIAGNOSIS — L97519 Non-pressure chronic ulcer of other part of right foot with unspecified severity: Secondary | ICD-10-CM

## 2016-08-20 MED ORDER — TRAMADOL HCL 50 MG PO TABS: ORAL_TABLET | ORAL | 0 refills | Status: DC

## 2016-08-24 ENCOUNTER — Encounter: Payer: Self-pay | Admitting: Podiatry

## 2016-08-25 ENCOUNTER — Encounter (HOSPITAL_COMMUNITY): Payer: Medicare Other

## 2016-08-25 ENCOUNTER — Ambulatory Visit (INDEPENDENT_AMBULATORY_CARE_PROVIDER_SITE_OTHER): Payer: Medicare Other | Admitting: Sports Medicine

## 2016-08-25 ENCOUNTER — Encounter: Payer: Medicare Other | Admitting: Vascular Surgery

## 2016-08-25 DIAGNOSIS — M79675 Pain in left toe(s): Secondary | ICD-10-CM

## 2016-08-25 DIAGNOSIS — G8929 Other chronic pain: Secondary | ICD-10-CM | POA: Diagnosis not present

## 2016-08-25 NOTE — Assessment & Plan Note (Signed)
Pain localized to between 2nd and 3rd toes of L foot.  Likely secondary to keratotic lesion between the toes, as well as her bunion which is adding pressure to the toes laterally.  Plain films from 7/16  with diffuse degenerative changes but no evidence of fracture of acute abnormalities.   -Add toe spacer to allow healing, and add metatarsal cookie to her boot to offload pressure on toe pads.  -Recommend continue Vitamin B6 for nerve pain, in addition to her Tramadol  -Continue wearing boot to protect the toes  -May benefit from soaking feet in warm water to help with circulation

## 2016-08-25 NOTE — Progress Notes (Signed)
CC: left foot pain   HPI:  83 y/o presenting with left foot pain which she describes as tender and throbbing.  Per patient, she was getting a pedicure and was told she has a callus which may need to be seen by a podiatrist.  She underwent a procedure in the office which she found painful where a corn was instrumented between toes 2 and 3. (Dr Charlsie Merlesegal 2017)  This area developed infectino.   Has completed a course of antibiotics for this as well.  [facility] and has been using silvadene cream.  She notes the pain has not gotten much better and is tingling/excruciating along her toes.    She has been wearing a boot to prevent herself from hitting it against objects when walking.  She is taking Tramadol for the pain.  Plain films from 7/16 with diffuse degenerative changes but no evidence of fracture of acute abnormalities.    ROS: No swelling.  No numbness.   Physical exam: 83 yo female, NAD BP (!) 163/76   Ht 5\' 1"  (1.549 m)   Wt 116 lb (52.6 kg)   BMI 21.92 kg/m   Left foot: No visible erythema or swelling.  Bilateral hammer toes.  Bunion present with valgus deviation displacing left 2nd toe This pushes 2nd prox phalanx X PIP joint of 3rd toe Keratotic lesion present between 2nd and 3rd toes of left foot.  Abnormal plantar callus on forefoot with loss of transverse arch  Range of motion is full in all directions. Strength is 5/5 in all directions.  Talar dome nontender; No tenderness over cuboid; No tenderness on posterior aspects of lateral and medial malleolus No sign of peroneal tendon subluxations.   A/P:   Left toe pain Pain localized to between 2nd and 3rd toes of L foot.  Likely secondary to a corn and a secondary keratotic lesion between the toes, as well as her bunion which is adding pressure to the toes laterally.  Plain films from 7/16  with diffuse degenerative changes but no evidence of fracture of acute abnormalities.   -Add toe spacer to allow  healing, and add metatarsal cookie to her boot to offload pressure on transverse arch  -Recommend continue Vitamin B6 for nerve pain, in addition to her Tramadol  -Continue wearing boot to protect the toes  -May benefit from soaking feet in warm water to help with circulation    I observed and examined the patient with the resident and agree with assessment and plan.  Note reviewed and modified by me. Enid BaasKarl Fields, MD

## 2016-08-25 NOTE — Progress Notes (Signed)
Pt picked up medical records that she called and requested on Friday 21 August 2016. Signed consent form was scanned into pt's chart under HIM.

## 2016-10-07 ENCOUNTER — Encounter (HOSPITAL_COMMUNITY): Payer: Medicare Other

## 2016-10-07 ENCOUNTER — Encounter (INDEPENDENT_AMBULATORY_CARE_PROVIDER_SITE_OTHER): Payer: Self-pay | Admitting: Orthopedic Surgery

## 2016-10-07 ENCOUNTER — Encounter: Payer: Medicare Other | Admitting: Vascular Surgery

## 2016-10-07 ENCOUNTER — Ambulatory Visit (INDEPENDENT_AMBULATORY_CARE_PROVIDER_SITE_OTHER): Payer: Medicare Other | Admitting: Orthopedic Surgery

## 2016-10-07 DIAGNOSIS — L84 Corns and callosities: Secondary | ICD-10-CM | POA: Insufficient documentation

## 2016-10-07 DIAGNOSIS — M205X2 Other deformities of toe(s) (acquired), left foot: Secondary | ICD-10-CM | POA: Diagnosis not present

## 2016-10-07 NOTE — Progress Notes (Signed)
Office Visit Note   Patient: Kristen Pham           Date of Birth: 1924-07-28           MRN: [id number] Visit Date: 10/07/2016              Requested by: Creola Corn, MD [address] [place], [place] [address] PCP: Creola Corn, MD  Chief Complaint  Patient presents with  . Left Foot - Pain      HPI: Patient is a 83 year old woman who presents with a painful corn with clawing of the left foot second toe. Patient has had increased pain recently she did have an MRI scan that suggested osteomyelitis of the middle phalanx of the second toe.  Assessment & Plan: Visit Diagnoses:  1. Acquired claw toe, left   2. Hard corn     Plan: Recommended a pad between the second toe and third toe patient was provided 1. Discussed with the corn gets bigger we can follow-up for removal of the corn. I do not feel patient needs any surgical intervention. There are no clinical signs of infection.  Follow-Up Instructions: Return if symptoms worsen or fail to improve.   Ortho Exam  Patient is alert, oriented, no adenopathy, well-dressed, normal affect, normal respiratory effort. Examination patient ambulates with a cane. She has a good dorsalis pedis and posterior tibial pulses she has good dorsiflexion of the ankle she has clawing of the toes with hallux valgus of the great toe with the great toe under the second toe. Patient has a large painful corn in the second webspace lateral border the second toe. After informed consent a 10 blade knife was used to tear the corn. There is good epithelial tissue at the base there is no ulcercellulitis there is no tenderness to palpation. There is no swelling of the toe no sausage digit swelling she does have some mild venous skin color changes. Review of the MRI scan does show increased edema and middle phalanx of the second toe. I feel this is most likely due to the corn and pressure. No clinical signs of infection.  Imaging: No results found. No images are  attached to the encounter.  Labs: Lab Results  Component Value Date   HGBA1C 5.9 (H) 05/15/2014   ESRSEDRATE 61 (H) 12/02/2009   CRP 11.9 (H) 12/02/2009    Orders:  No orders of the defined types were placed in this encounter.  No orders of the defined types were placed in this encounter.    Procedures: No procedures performed  Clinical Data: No additional findings.  ROS:  All other systems negative, except as noted in the HPI. Review of Systems  Objective: Vital Signs: There were no vitals taken for this visit.  Specialty Comments:  No specialty comments available.  PMFS History: Patient Active Problem List   Diagnosis Date Noted  . Acquired claw toe, left 10/07/2016  . Hard corn 10/07/2016  . Chronic toe pain, left foot 08/25/2016  . 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 Knee pain, bilateral 06/21/2012  . Primary osteoarthritis of both knees 06/21/2012  . Generalized osteoarthritis of hand 06/21/2012   Past Medical History:  Diagnosis Date  . Carpal tunnel syndrome   . Hypertension     Family History  Problem Relation Age of Onset  . Cerebral aneurysm Father     Past Surgical History:  Procedure Laterality Date  . ABDOMINAL HYSTERECTOMY    . CESAREAN SECTION    . HAND SURGERY  bilateral release   . TONSILECTOMY, ADENOIDECTOMY, BILATERAL MYRINGOTOMY AND TUBES     Social History   Occupational History  . retired     Social History Main Topics  . Smoking status: Never Smoker  . Smokeless tobacco: Never Used  . Alcohol use 0.0 oz/week     Comment: occasionally   . Drug use: No  . Sexual activity: Not on file

## 2016-11-05 ENCOUNTER — Telehealth (INDEPENDENT_AMBULATORY_CARE_PROVIDER_SITE_OTHER): Payer: Self-pay | Admitting: Orthopedic Surgery

## 2016-11-05 NOTE — Telephone Encounter (Signed)
Received VM from patient wanting records faxed to [facility]/Dr Hewitt. I called her back and told her that we need authorization. She said she would come by in the morning and sign release form.

## 2016-12-07 ENCOUNTER — Ambulatory Visit (INDEPENDENT_AMBULATORY_CARE_PROVIDER_SITE_OTHER): Payer: Medicare Other | Admitting: Orthopedic Surgery

## 2016-12-07 ENCOUNTER — Encounter (INDEPENDENT_AMBULATORY_CARE_PROVIDER_SITE_OTHER): Payer: Self-pay | Admitting: Orthopedic Surgery

## 2016-12-07 DIAGNOSIS — G8929 Other chronic pain: Secondary | ICD-10-CM

## 2016-12-07 DIAGNOSIS — L84 Corns and callosities: Secondary | ICD-10-CM

## 2016-12-07 DIAGNOSIS — M205X2 Other deformities of toe(s) (acquired), left foot: Secondary | ICD-10-CM | POA: Diagnosis not present

## 2016-12-07 DIAGNOSIS — M79675 Pain in left toe(s): Secondary | ICD-10-CM

## 2016-12-07 NOTE — Progress Notes (Signed)
Office Visit Note   Patient: Kristen Pham           Date of Birth: 09/08/1924           MRN: [id number] Visit Date: 12/07/2016              Requested by: Creola Cornusso, John, MD [address] [place], [place] [address] PCP: Creola Cornusso, John, MD  No chief complaint on file.     HPI: Patient is a 83 year old woman who is seen for initial evaluation with her daughter.  Patient complains of a painful corn on the second toe and the second webspace adjacent to the third toe she states she did see Dr. Victorino DikeHewitt and after review of her MRI scan was told that she needed an amputation of the second toe.  Patient complains of pain with ambulation denies any redness swelling or drainage.  Assessment & Plan: Visit Diagnoses:  1. Acquired claw toe, left   2. Hard corn   3. Chronic toe pain, left foot     Plan: The cornea and ulcer were debrided of skin and soft tissues she tolerated this well and a soft pad was placed in the second webspace.  After debridement patient had no pain.  Recommend that she go to [employer] [place] [address] for a pair of [employer] acorn shoes.  Follow-Up Instructions: Return in about 4 weeks (around 01/04/2017).   Ortho Exam  Patient is alert, oriented, no adenopathy, well-dressed, normal affect, normal respiratory effort. Patient has a palpable dorsalis pedis and posterior tibial pulse.  She has capillary refill less than 2 seconds in her toes with good circulation.  She does have some dark discoloration to the second toe but there is no sausage digit swelling no drainage no tenderness to palpation no signs of infection.  She has a large ulcerative corn over the PIP joint in the second webspace.  After informed consent a 10 blade knife was used to debride the skin and soft tissue back to healthy viable granulation tissue there is no exposed bone or tendon or drainage.  The wound is 5 mm in diameter and 2 mm deep.  Imaging: No results found. No images are attached to the  encounter.  Labs: Lab Results  Component Value Date   HGBA1C 5.9 (H) 05/15/2014   ESRSEDRATE 61 (H) 12/02/2009   CRP 11.9 (H) 12/02/2009    Orders:  No orders of the defined types were placed in this encounter.  No orders of the defined types were placed in this encounter.    Procedures: No procedures performed  Clinical Data: No additional findings.  ROS:  All other systems negative, except as noted in the HPI. Review of Systems  Objective: Vital Signs: There were no vitals taken for this visit.  Specialty Comments:  No specialty comments available.  PMFS History: Patient Active Problem List   Diagnosis Date Noted  . Acquired claw toe, left 10/07/2016  . Hard corn 10/07/2016  . Chronic toe pain, left foot 08/25/2016  . 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 Knee pain, bilateral 06/21/2012  . Primary osteoarthritis of both knees 06/21/2012  . Generalized osteoarthritis of hand 06/21/2012   Past Medical History:  Diagnosis Date  . Carpal tunnel syndrome   . Hypertension     Family History  Problem Relation Age of Onset  . Cerebral aneurysm Father     Past Surgical History:  Procedure Laterality Date  . ABDOMINAL HYSTERECTOMY    . CESAREAN SECTION    .  HAND SURGERY     bilateral release   . TONSILECTOMY, ADENOIDECTOMY, BILATERAL MYRINGOTOMY AND TUBES     Social History   Occupational History  . Occupation: retired   Tobacco Use  . Smoking status: Never Smoker  . Smokeless tobacco: Never Used  Substance and Sexual Activity  . Alcohol use: Yes    Alcohol/week: 0.0 oz    Comment: occasionally   . Drug use: No  . Sexual activity: Not on file

## 2016-12-28 ENCOUNTER — Ambulatory Visit (INDEPENDENT_AMBULATORY_CARE_PROVIDER_SITE_OTHER): Payer: Medicare Other | Admitting: Orthopedic Surgery

## 2017-01-04 ENCOUNTER — Ambulatory Visit (INDEPENDENT_AMBULATORY_CARE_PROVIDER_SITE_OTHER): Payer: Medicare Other | Admitting: Orthopedic Surgery

## 2017-01-05 ENCOUNTER — Ambulatory Visit (INDEPENDENT_AMBULATORY_CARE_PROVIDER_SITE_OTHER): Payer: Medicare Other | Admitting: Orthopedic Surgery

## 2017-01-05 ENCOUNTER — Encounter (INDEPENDENT_AMBULATORY_CARE_PROVIDER_SITE_OTHER): Payer: Self-pay | Admitting: Orthopedic Surgery

## 2017-01-05 DIAGNOSIS — L97521 Non-pressure chronic ulcer of other part of left foot limited to breakdown of skin: Secondary | ICD-10-CM | POA: Insufficient documentation

## 2017-01-05 DIAGNOSIS — L84 Corns and callosities: Secondary | ICD-10-CM

## 2017-01-05 NOTE — Progress Notes (Signed)
Office Visit Note   Patient: Kristen Pham           Date of Birth: 04/21/1924           MRN: [id number] Visit Date: 01/05/2017              Requested by: Creola Cornusso, John, MD [address] [place], [place] [address] PCP: Creola Cornusso, John, MD  Chief Complaint  Patient presents with  . Right Foot - Pain      HPI: Patient is a 83 year old woman who presents with burning pain across the forefoot due to increased pressure she is currently wearing an extra wide shoe with an over-the-counter orthotic patient complains of a painful ulcer in the second webspace on the lateral border of the second toe.  She has been using pads to relieve pressure without relief.  Assessment & Plan: Visit Diagnoses:  1. Hard corn   2. Non-pressure chronic ulcer of other part of left foot limited to breakdown of skin (HCC)     Plan: The ulcer was debrided patient was given new pads to place between her toes she did have immediate relief after the debridement she will call and follow-up as needed.  Follow-Up Instructions: Return if symptoms worsen or fail to improve.   Ortho Exam  Patient is alert, oriented, no adenopathy, well-dressed, normal affect, normal respiratory effort. Examination patient is slow to get from a sitting to a standing position she does have an antalgic gait she has arthritis involving her upper and lower extremities.  She has fixed clawing of the toes she has a good pulse she has dorsiflexion just past neutral.  She has a ulcer on the lateral border the second toe of the left foot second webspace.  There is no redness no cellulitis no swelling.  After informed consent a 10 blade knife was used to debride the skin and soft tissue back to healthy tissue.  A pad was applied the ulcer is 7 mm in diameter and 3 mm deep.  Imaging: No results found. No images are attached to the encounter.  Labs: Lab Results  Component Value Date   HGBA1C 5.9 (H) 05/15/2014   ESRSEDRATE 61 (H) 12/02/2009   CRP 11.9 (H) 12/02/2009    @LABSALLVALUES (HGBA1)@  There is no height or weight on file to calculate BMI.  Orders:  No orders of the defined types were placed in this encounter.  No orders of the defined types were placed in this encounter.    Procedures: No procedures performed  Clinical Data: No additional findings.  ROS:  All other systems negative, except as noted in the HPI. Review of Systems  Objective: Vital Signs: There were no vitals taken for this visit.  Specialty Comments:  No specialty comments available.  PMFS History: Patient Active Problem List   Diagnosis Date Noted  . Non-pressure chronic ulcer of other part of left foot limited to breakdown of skin (HCC) 01/05/2017  . Acquired claw toe, left 10/07/2016  . Hard corn 10/07/2016  . Chronic toe pain, left foot 08/25/2016  . 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 Knee pain, bilateral 06/21/2012  . Primary osteoarthritis of both knees 06/21/2012  . Generalized osteoarthritis of hand 06/21/2012   Past Medical History:  Diagnosis Date  . Carpal tunnel syndrome   . Hypertension     Family History  Problem Relation Age of Onset  . Cerebral aneurysm Father     Past Surgical History:  Procedure Laterality Date  . ABDOMINAL HYSTERECTOMY    .  CESAREAN SECTION    . HAND SURGERY     bilateral release   . TONSILECTOMY, ADENOIDECTOMY, BILATERAL MYRINGOTOMY AND TUBES     Social History   Occupational History  . Occupation: retired   Tobacco Use  . Smoking status: Never Smoker  . Smokeless tobacco: Never Used  Substance and Sexual Activity  . Alcohol use: Yes    Alcohol/week: 0.0 oz    Comment: occasionally   . Drug use: No  . Sexual activity: Not on file

## 2017-04-06 ENCOUNTER — Other Ambulatory Visit: Payer: Self-pay

## 2017-04-06 MED ORDER — TRAMADOL HCL 50 MG PO TABS: ORAL_TABLET | ORAL | 0 refills | Status: DC

## 2018-01-16 ENCOUNTER — Other Ambulatory Visit: Payer: Self-pay | Admitting: Sports Medicine

## 2018-05-19 NOTE — Progress Notes (Signed)
New Patient Virtual Visit via Video Note The purpose of this virtual visit is to provide medical care while limiting exposure to the novel coronavirus.    Consent was obtained for video visit:  Yes.   Answered questions that patient had about telehealth interaction:  Yes.   I discussed the limitations, risks, security and privacy concerns of performing an evaluation and management service by telemedicine. I also discussed with the patient that there may be a patient responsible charge related to this service. The patient expressed understanding and agreed to proceed.  Pt location: Home Physician Location: office Name of referring provider:  Creola Corn, MD I connected with Angelica Ran at patients initiation/request on 05/23/2018 at  1:00 PM EDT by video enabled telemedicine application and verified that I am speaking with the correct person using two identifiers. Pt MRN:  [id number] Pt DOB:  04-01-1924 Video Participants:  Angelica Ran; daughter    History of Present Illness: Kristen Pham is a 85 y.o. female with osteoarthritis, hypertension, gout, peripheral arterial disease, and psoriatic arthritis (previously on methotrexate) presenting for evaluation of peripheral neuropathy.   Starting around 2018, she began having left second toe pain.  She was found to have an infected corn which was debrided and treated with antibiotics, however, despite the wound healing, she continued to have severe pain in the same area.  For a year following this, she recalls having swelling and redness in her right toe.  Since this time, she has had ongoing left foot pain and numbness and tingling.  About a year ago, she began having numbness and tingling of the toes bilaterally.  She has some imbalance and walks with a cane as needed.  She has not suffered any falls.  She does not have leg weakness. She has tried gabapentin with no relief.  She applies lidocaine ointment which helps ease some of her  pain.  Out-side paper records, electronic medical record, and images have been reviewed where available and summarized as:  Lab Results  Component Value Date   HGBA1C 5.9 (H) 05/15/2014   Lab Results  Component Value Date   VITAMINB12 768 05/15/2014   Lab Results  Component Value Date   TSH 2.300 05/15/2014   Lab Results  Component Value Date   ESRSEDRATE 61 (H) 12/02/2009    Past Medical History:  Diagnosis Date   Carpal tunnel syndrome    Hypertension     Past Surgical History:  Procedure Laterality Date   ABDOMINAL HYSTERECTOMY     CESAREAN SECTION     HAND SURGERY     bilateral release    TONSILECTOMY, ADENOIDECTOMY, BILATERAL MYRINGOTOMY AND TUBES       Medications:  Outpatient Encounter Medications as of 05/23/2018  Medication Sig Note   acetaminophen (TYLENOL) 325 MG tablet Take 650 mg by mouth every 6 (six) hours as needed.    amLODipine (NORVASC) 10 MG tablet Take 10 mg by mouth daily.    atenolol (TENORMIN) 50 MG tablet Take 50 mg by mouth daily.    calcium-vitamin D (OSCAL WITH D) 500-200 MG-UNIT per tablet Take 2 tablets by mouth.    fluocinonide cream (LIDEX) 0.05 % as needed. 08/20/2014: 08/20/14 prn psoriasis   glucosamine-chondroitin 500-400 MG tablet Take 1 tablet by mouth daily.    hydrocortisone (ANUCORT-HC) 25 MG suppository Place 1 suppository (25 mg total) rectally 2 (two) times daily.    ibuprofen (ADVIL) 200 MG tablet Take 200 mg by mouth every 6 (six) hours as needed.  naproxen (NAPROSYN) 250 MG tablet Take by mouth as needed.    RESTASIS 0.05 % ophthalmic emulsion INSTILL 1 DROP INTO LEFT EYE TWICE A DAY    triamcinolone cream (KENALOG) 0.1 % Apply to affected area daily.    nortriptyline (PAMELOR) 10 MG capsule Take 1 capsule (10 mg total) by mouth at bedtime.    [DISCONTINUED] traMADol (ULTRAM) 50 MG tablet Take one tablet twice a day as needed for pain and ok to refill every 3 months    Facility-Administered Encounter  Medications as of 05/23/2018  Medication   triamcinolone acetonide (KENALOG) 10 MG/ML injection 10 mg    Allergies:  Allergies  Allergen Reactions   Ivp Dye [Iodinated Diagnostic Agents]     Felt confused, disoriented    Family History: Sister passed with congestive heart failure Family History  Problem Relation Age of Onset   Cerebral aneurysm Father     Social History: Social History   Tobacco Use   Smoking status: Never Smoker   Smokeless tobacco: Never Used  Substance Use Topics   Alcohol use: Yes    Alcohol/week: 0.0 standard drinks    Comment: occasionally    Drug use: No   Social History   Social History Narrative   Lives at home alone   Drinks coke and coffee occasionally     Review of Systems:  CONSTITUTIONAL: No fevers, chills, night sweats, or weight loss.   EYES: No visual changes or eye pain ENT: No hearing changes.  No history of nose bleeds.   RESPIRATORY: No cough, wheezing and shortness of breath.   CARDIOVASCULAR: Negative for chest pain, and palpitations.   GI: Negative for abdominal discomfort, blood in stools or black stools.  No recent change in bowel habits.   GU:  No history of incontinence.   MUSCLOSKELETAL: +history of joint pain or swelling.  No myalgias.   SKIN: Negative for lesions, rash, and itching.   HEMATOLOGY/ONCOLOGY: Negative for prolonged bleeding, bruising easily, and swollen nodes.  No history of cancer.   ENDOCRINE: Negative for cold or heat intolerance, polydipsia or goiter.   PSYCH:  No depression or anxiety symptoms.   NEURO: As Above.   General Medical Exam:  Well appearing, comfortable.  Nonlabored breathing.   Neurological Exam: MENTAL STATUS including orientation to time, place, person, recent and remote memory, attention span and concentration, language, and fund of knowledge fairly intact.  Speech is not dysarthric.  CRANIAL NERVES:  Normal conjugate, extra-ocular eye movements in all directions of gaze.   Mild bilateral ptosis.  Normal facial symmetry and movements.  Normal shoulder shrug and head rotation.  Tongue is midline.  MOTOR:  Antigravity in all extremities.  No abnormal movements.  No pronator drift.   SENSORY: Unable to assess  COORDINATION/GAIT: Normal finger to nose bilaterally.  Intact rapid alternating movements bilaterally.  Gait appears mildly wide-based with short steps, unassisted and overall stable.   IMPRESSION/PLAN: 1.  Peripheral neuropathy contributed by peripheral arterial disease   -Previously tried gabapentin and tramadol 2.  Reflex sympathetic dystrophy of the left foot following surgery  Start nortriptyline 10 mg at bedtime.  Patient instructed to call with update in 3 weeks to determine further titration. Fall precautions discussed, always use a cane on uneven ground.   Follow Up Instructions:  I discussed the assessment and treatment plan with the patient. The patient was provided an opportunity to ask questions and all were answered. The patient agreed with the plan and demonstrated an understanding of  the instructions.   The patient was advised to call back or seek an in-person evaluation if the symptoms worsen or if the condition fails to improve as anticipated.  Return to clinic in 3 months   Glendale Chardonika K Jorian [place], DO

## 2018-05-23 ENCOUNTER — Other Ambulatory Visit: Payer: Self-pay

## 2018-05-23 ENCOUNTER — Encounter: Payer: Self-pay | Admitting: *Deleted

## 2018-05-23 ENCOUNTER — Telehealth (INDEPENDENT_AMBULATORY_CARE_PROVIDER_SITE_OTHER): Payer: Medicare Other | Admitting: Neurology

## 2018-05-23 DIAGNOSIS — G63 Polyneuropathy in diseases classified elsewhere: Secondary | ICD-10-CM

## 2018-05-23 DIAGNOSIS — G9050 Complex regional pain syndrome I, unspecified: Secondary | ICD-10-CM

## 2018-05-23 MED ORDER — NORTRIPTYLINE HCL 10 MG PO CAPS: 10.0000 mg | ORAL_CAPSULE | Freq: Every day | ORAL | 3 refills | Status: DC

## 2018-06-08 ENCOUNTER — Ambulatory Visit: Payer: Medicare Other | Admitting: Orthopedic Surgery

## 2018-06-09 ENCOUNTER — Encounter: Payer: Self-pay | Admitting: Orthopedic Surgery

## 2018-06-09 ENCOUNTER — Ambulatory Visit (INDEPENDENT_AMBULATORY_CARE_PROVIDER_SITE_OTHER): Payer: Medicare Other | Admitting: Orthopedic Surgery

## 2018-06-09 ENCOUNTER — Other Ambulatory Visit: Payer: Self-pay

## 2018-06-09 VITALS — Ht 61.0 in | Wt 106.0 lb

## 2018-06-09 DIAGNOSIS — L97521 Non-pressure chronic ulcer of other part of left foot limited to breakdown of skin: Secondary | ICD-10-CM

## 2018-06-09 DIAGNOSIS — B351 Tinea unguium: Secondary | ICD-10-CM | POA: Diagnosis not present

## 2018-06-09 NOTE — Progress Notes (Signed)
Office Visit Note   Patient: Kristen Pham           Date of Birth: [birth year]           MRN: [id number] Visit Date: 06/09/2018              Requested by: Creola Corn, MD [address] [place], [place] [address] PCP: Creola Corn, MD  Chief Complaint  Patient presents with  . Left Foot - Follow-up      HPI: Patient is a 85 year old woman who presents complaining of a painful mass lateral border left foot second toe she also complains of pain from her onychomycotic nails.  Assessment & Plan: Visit Diagnoses:  1. Non-pressure chronic ulcer of other part of left foot limited to breakdown of skin (HCC)   2. Onychomycosis     Plan: The hard corn was pared on the second toe left foot nails were trimmed x10.  Follow-up in 4 weeks if she has recurrence of the corn.  Follow-Up Instructions: Return in about 4 weeks (around 07/07/2018).   Ortho Exam  Patient is alert, oriented, no adenopathy, well-dressed, normal affect, normal respiratory effort. Examination patient ambulates well she has good pulses in both feet.  She has a painful corn on the lateral border of the left foot second toe she also has thickened discolored onychomycotic nails x10.  After a digital block sterile prepping and informed consent the corn was pared without complications this was touched with silver nitrate a Band-Aid was applied.  The nails were trimmed x10 without complications there is no signs of paronychial infection.  Patient states that her pain completely resolved after the digital block and trimming of the corn.  Imaging: No results found. No images are attached to the encounter.  Labs: Lab Results  Component Value Date   HGBA1C 5.9 (H) 05/15/2014   ESRSEDRATE 61 (H) 12/02/2009   CRP 11.9 (H) 12/02/2009     No results found for: ALBUMIN, PREALBUMIN, LABURIC  Body mass index is 20.03 kg/m.  Orders:  No orders of the defined types were placed in this encounter.  No orders of the defined  types were placed in this encounter.    Procedures: No procedures performed  Clinical Data: No additional findings.  ROS:  All other systems negative, except as noted in the HPI. Review of Systems  Objective: Vital Signs: Ht 5\' 1"  (1.549 m)   Wt 106 lb (48.1 kg)   BMI 20.03 kg/m   Specialty Comments:  No specialty comments available.  PMFS History: Patient Active Problem List   Diagnosis Date Noted  . Non-pressure chronic ulcer of other part of left foot limited to breakdown of skin (HCC) 01/05/2017  . Acquired claw toe, left 10/07/2016  . Hard corn 10/07/2016  . Chronic toe pain, left foot 08/25/2016  . 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 Knee pain, bilateral 06/21/2012  . Primary osteoarthritis of both knees 06/21/2012  . Generalized osteoarthritis of hand 06/21/2012   Past Medical History:  Diagnosis Date  . Carpal tunnel syndrome   . Hypertension     Family History  Problem Relation Age of Onset  . Cerebral aneurysm Father     Past Surgical History:  Procedure Laterality Date  . ABDOMINAL HYSTERECTOMY    . CESAREAN SECTION    . HAND SURGERY     bilateral release   . TONSILECTOMY, ADENOIDECTOMY, BILATERAL MYRINGOTOMY AND TUBES     Social History   Occupational History  . Occupation: retired  Tobacco Use  . Smoking status: Never Smoker  . Smokeless tobacco: Never Used  Substance and Sexual Activity  . Alcohol use: Yes    Alcohol/week: 0.0 standard drinks    Comment: occasionally   . Drug use: No  . Sexual activity: Not on file

## 2018-06-14 ENCOUNTER — Telehealth: Payer: Self-pay | Admitting: Neurology

## 2018-06-14 NOTE — Telephone Encounter (Signed)
Spoke with pts daughter, Elnita Maxwell. She states that pt took Nortriptyline for 7-8 days. Pt thought medication would make her sleepy but instead she was up all night with pain.  Pt was also taking IBU along with Nortriptyline. During this time pt developed a rash on hands and forearms. Has since subsided and feels it was from IBU.  Pt is currently using oils and lidocaine topically for pain. Dr. Lajoyce Corners removed scar tissue last Thursday per daughter.  The daughter states that pt is in debilitating pain.

## 2018-06-14 NOTE — Telephone Encounter (Signed)
Please instruct her to increase nortriptyline to 20mg  at bedtime (take 2 10mg  tab). We need to check 12-lead EKG to be sure we can continue to increase the medication. Has she been on Lyrica?  This is another option, if nortriptyline does not help.

## 2018-06-14 NOTE — Telephone Encounter (Signed)
Daughter left VM about referral needed for Physician for chronic pain. Please call her back at [phone] ext. [phone]. Thanks!

## 2018-06-15 ENCOUNTER — Telehealth: Payer: Self-pay | Admitting: Neurology

## 2018-06-15 MED ORDER — PREGABALIN 50 MG PO CAPS: ORAL_CAPSULE | ORAL | 3 refills | Status: DC

## 2018-06-15 NOTE — Addendum Note (Signed)
Addended by: Glendale Chard on: 06/15/2018 01:18 PM   Modules accepted: Orders

## 2018-06-15 NOTE — Telephone Encounter (Signed)
Spoke with pts daughter--gave instructions to increase pt  Rx Nortriptyline to 20 mg by Dr. Allena Katz. Pt never took Lyrica before and pt daughter would like to go  different directions-- try to call pt primary care for her chronic pain  because her Rx Gabapentin is not working.

## 2018-06-15 NOTE — Telephone Encounter (Signed)
Patient's daughter returned your call. She will be in a meeting in about 10 Minutes, so if unable to reach her. She will call back tomorrow. Thanks

## 2018-06-15 NOTE — Telephone Encounter (Signed)
LVM-to pt daughter to return call to the office

## 2018-06-15 NOTE — Telephone Encounter (Signed)
We can try Lyrica 50mg  - 1 tablet at bedtime for one week, then increase to 1 tablet twice daily.  Side effects include sedation, dizziness, and leg swelling, but this is a very low dose.  I will send Rx to her phamracy.   She may stop nortriptyline.   Jerre Diguglielmo K. Allena Katz, DO

## 2018-06-16 NOTE — Telephone Encounter (Signed)
LVM--pt daughter to call the office back

## 2018-06-17 NOTE — Telephone Encounter (Signed)
Spoke to daughter--notified to  try Rx Lyrica 50 mg with instructions by Dr. Allena Katz. Pt daughter understood and still wanted to get in touch with the primary care --- referral for pain management

## 2018-06-19 ENCOUNTER — Telehealth: Payer: Self-pay | Admitting: *Deleted

## 2018-06-19 NOTE — Telephone Encounter (Signed)
LM for emergency contact (Ms. Tumlin) to call [phone] re: information on recent visit to [facility] on 06/09/2018 for [employer].

## 2018-06-27 ENCOUNTER — Telehealth: Payer: Self-pay | Admitting: Orthopedic Surgery

## 2018-06-27 NOTE — Telephone Encounter (Signed)
LMOM to patient-she left message on VM stating she had a appt w/Duda on 07/02/18-advised patient that was on a Saturday and we had no appts on our schedule for a return visit.  Advised patient to call us back to make appt @ [phone]

## 2018-07-01 ENCOUNTER — Other Ambulatory Visit: Payer: Self-pay | Admitting: Sports Medicine

## 2018-07-12 ENCOUNTER — Other Ambulatory Visit: Payer: Self-pay | Admitting: Sports Medicine

## 2018-07-14 ENCOUNTER — Ambulatory Visit: Payer: Medicare Other | Admitting: Orthopedic Surgery

## 2018-07-19 ENCOUNTER — Ambulatory Visit: Payer: Medicare Other | Admitting: Orthopedic Surgery

## 2018-09-05 ENCOUNTER — Encounter: Payer: Self-pay | Admitting: Neurology

## 2018-09-05 ENCOUNTER — Ambulatory Visit (INDEPENDENT_AMBULATORY_CARE_PROVIDER_SITE_OTHER): Payer: Medicare Other | Admitting: Neurology

## 2018-09-05 ENCOUNTER — Other Ambulatory Visit: Payer: Self-pay

## 2018-09-05 VITALS — BP 120/60 | HR 69 | Ht 61.0 in | Wt 110.0 lb

## 2018-09-05 DIAGNOSIS — G9050 Complex regional pain syndrome I, unspecified: Secondary | ICD-10-CM | POA: Diagnosis not present

## 2018-09-05 NOTE — Progress Notes (Signed)
Follow-up Visit   Date: 09/05/18   Kristen Pham MRN: [id number] DOB: December 17, 1924   Interim History: Kristen Pham is a 85 y.o. female with osteoarthritis, hypertension, gout, peripheral arterial disease, and psoriatic arthritis (previously on methotrexate) returning to the clinic for follow-up of left 2nd toe pain.  The patient was accompanied to the clinic by daughter who also provides collateral information.    History of present illness: Starting around 2018, she began having left second toe pain.  She was found to have an infected corn which was debrided and treated with antibiotics, however, despite the wound healing, she continued to have severe pain in the same area.  For a year following this, she recalls having swelling and redness in her right toe.  Since this time, she has had ongoing left foot pain and numbness and tingling.  About a year ago, she began having numbness and tingling of the toes bilaterally.  She has some imbalance and walks with a cane as needed.  She has not suffered any falls.  She does not have leg weakness. She has tried gabapentin with no relief.  She applies lidocaine ointment which helps ease some of her pain.  UPDATE 09/05/2018:  She is here for follow-up.  She continues to have left 2nd toe pain, which is sharp, throbbing, and severe.  She also complains of discoloration of the toe. At her last visit, I diagnosed her with RSD and recommended a trial of nortriptyline and to see pain management.  She did not tolerate this and Lyrica 50mg  was offered.  She did not increase the dose and stopped the medication after her first refill.  She went to see a chiropractor and was getting laser therapy which had variable benefit. Sometimes, she felt that it help, other times, there was no change.  Medications:  Current Outpatient Medications on File Prior to Visit  Medication Sig Dispense Refill  . acetaminophen (TYLENOL) 325 MG tablet Take 650 mg by mouth  every 6 (six) hours as needed.    Marland Kitchen amLODipine (NORVASC) 10 MG tablet Take 10 mg by mouth daily.    Marland Kitchen atenolol (TENORMIN) 50 MG tablet Take 50 mg by mouth daily.    . calcium-vitamin D (OSCAL WITH D) 500-200 MG-UNIT per tablet Take 2 tablets by mouth.    . fluocinonide cream (LIDEX) 0.05 % as needed.    Marland Kitchen glucosamine-chondroitin 500-400 MG tablet Take 1 tablet by mouth daily.    . hydrocortisone (ANUCORT-HC) 25 MG suppository Place 1 suppository (25 mg total) rectally 2 (two) times daily. 14 suppository 0  . ibuprofen (ADVIL) 200 MG tablet Take 200 mg by mouth every 6 (six) hours as needed.    . naproxen (NAPROSYN) 250 MG tablet Take by mouth as needed.    . RESTASIS 0.05 % ophthalmic emulsion INSTILL 1 DROP INTO LEFT EYE TWICE A DAY    . RESTASIS 0.05 % ophthalmic emulsion     . triamcinolone cream (KENALOG) 0.1 % Apply to affected area daily. 30 g 0  . amLODipine (NORVASC) 10 MG tablet     . atenolol (TENORMIN) 50 MG tablet     . predniSONE (DELTASONE) 10 MG tablet Take 20 mg by mouth daily.    . pregabalin (LYRICA) 50 MG capsule Take 1 tablet at bedtime for one week, then increase to 1 tablet twice daily. (Patient not taking: Reported on 09/05/2018) 60 capsule 3  . pregabalin (LYRICA) 50 MG capsule     . RESTASIS 0.05 %  ophthalmic emulsion      Current Facility-Administered Medications on File Prior to Visit  Medication Dose Route Frequency Provider Last Rate Last Dose  . triamcinolone acetonide (KENALOG) 10 MG/ML injection 10 mg  10 mg Other Once Asencion IslamStover, Titorya, DPM        Allergies:  Allergies  Allergen Reactions  . Ivp Dye [Iodinated Diagnostic Agents]     Felt confused, disoriented    Review of Systems:  CONSTITUTIONAL: No fevers, chills, night sweats, or weight loss.  EYES: No visual changes or eye pain ENT: No hearing changes.  No history of nose bleeds.   RESPIRATORY: No cough, wheezing and shortness of breath.   CARDIOVASCULAR: Negative for chest pain, and palpitations.    GI: Negative for abdominal discomfort, blood in stools or black stools.  No recent change in bowel habits.   GU:  No history of incontinence.   MUSCLOSKELETAL: +history of joint pain or swelling.  No myalgias.   SKIN: Negative for lesions, rash, and itching.   ENDOCRINE: Negative for cold or heat intolerance, polydipsia or goiter.   PSYCH:  No depression or anxiety symptoms.   NEURO: As Above.   Vital Signs:  BP 120/60   Pulse 69   Ht 5\' 1"  (1.549 m)   Wt 110 lb (49.9 kg)   SpO2 97%   BMI 20.78 kg/m    General Medical Exam:   General:  Well appearing, comfortable  Eyes/ENT: see cranial nerve examination.   Neck:  No carotid bruits. Respiratory:  Clear to auscultation, good air entry bilaterally.   Cardiac:  Regular rate and rhythm, no murmur.   Ext:  No edema; left 2nd toe is hyperpigmented with flexion deformity  Neurological Exam: MENTAL STATUS including orientation to time, place, person, recent and remote memory, attention span and concentration, language, and fund of knowledge is normal.  Speech is not dysarthric.  CRANIAL NERVES:  Face is symmetric.   MOTOR:  Motor strength is 5/5 in all extremities, including distally in the feet.  No atrophy, fasciculations or abnormal movements.  No pronator drift.  Tone is normal.    MSRs:  Reflexes are 1+/4 throughout, except absent at the ankles bilaterally  SENSORY:  Intact to vibration throughout.  Hyperesthesia to pin prick and temperature over the dorsum of the left foot, worse over the 2nd left toe.  COORDINATION/GAIT:  Gait narrow based and stable, unassisted  IMPRESSION/PLAN: Reflex sympathetic dystrophy of the left foot following surgery  - Refer to pain management  Peripheral neuropathy is very mild bilaterally, and would not explain her asymmetric left foot pain.  - Fall precautions discussed, always use a cane on uneven ground.  Thank you for allowing me to participate in patient's care.  If I can answer any  additional questions, I would be pleased to do so.    Sincerely,    Donika K. Allena KatzPatel, DO

## 2018-09-05 NOTE — Patient Instructions (Addendum)
Referral to pain management for reflex sympathetic dystrophy

## 2018-09-07 ENCOUNTER — Telehealth: Payer: Self-pay | Admitting: Neurology

## 2018-09-07 DIAGNOSIS — G9050 Complex regional pain syndrome I, unspecified: Secondary | ICD-10-CM

## 2018-09-07 NOTE — Telephone Encounter (Signed)
Pt daughter would like to be referred to Dr Julious Oka @ [facility] in [place] for pain management

## 2018-09-08 NOTE — Telephone Encounter (Signed)
Okay but please change referral you already put in.  Thanks.

## 2018-09-08 NOTE — Telephone Encounter (Signed)
Please advise if okay to send referral and thank you

## 2018-09-09 ENCOUNTER — Other Ambulatory Visit: Payer: Self-pay

## 2018-09-09 NOTE — Telephone Encounter (Signed)
Placed referral and faxed with documents and notes

## 2018-09-12 ENCOUNTER — Telehealth: Payer: Self-pay | Admitting: Neurology

## 2018-09-12 ENCOUNTER — Other Ambulatory Visit: Payer: Self-pay

## 2018-09-12 NOTE — Telephone Encounter (Signed)
Patient is calling in about her Kenalog oinment - [facility] on file. Please send. Thanks!

## 2018-09-13 NOTE — Telephone Encounter (Signed)
Agree, please ask her to contact her PCP for refill on kenalog ointment.

## 2018-09-13 NOTE — Telephone Encounter (Signed)
Had to send this but is this what her pcp should rx. Or can you refill

## 2018-09-13 NOTE — Telephone Encounter (Signed)
Patient advised to contact her pcp for refil

## 2018-10-12 DIAGNOSIS — H903 Sensorineural hearing loss, bilateral: Secondary | ICD-10-CM | POA: Insufficient documentation

## 2018-10-19 IMAGING — CR DG FOOT COMPLETE 3+V*L*
3 series · 3 of 3 positions shown · non-contrast
Comparison: 11/24/2012.

CLINICAL DATA: Evaluate for osteomyelitis.

EXAM:
LEFT FOOT - COMPLETE 3+ VIEW

[t foot ap left]
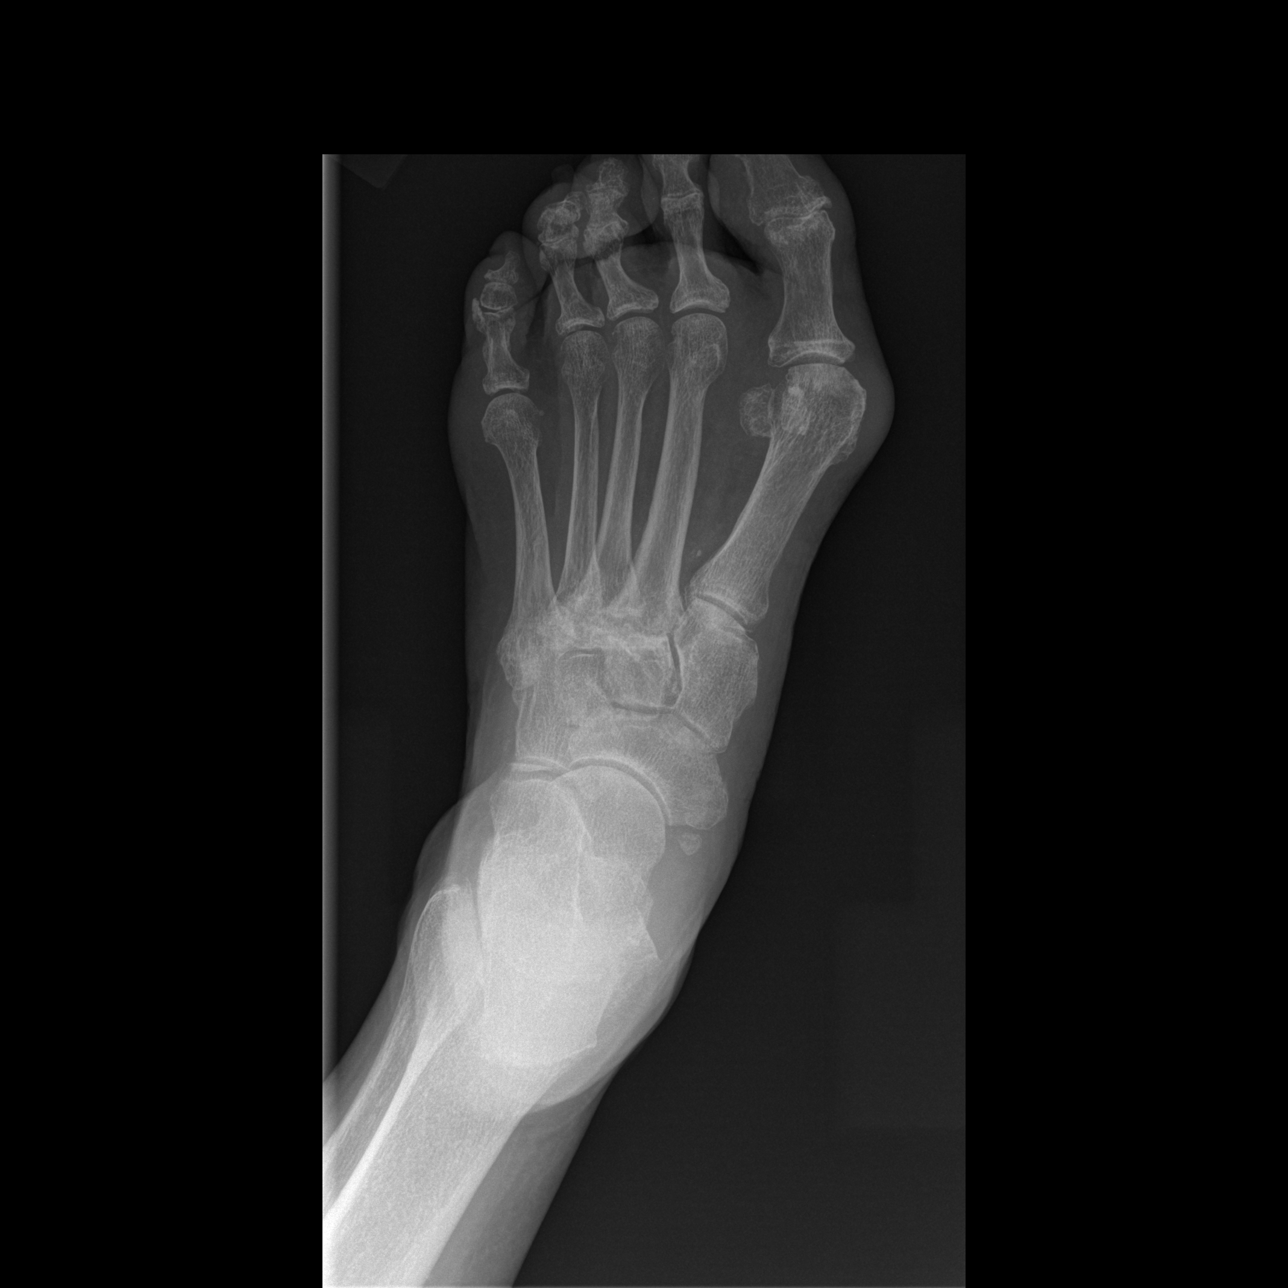

[t foot oblique left]
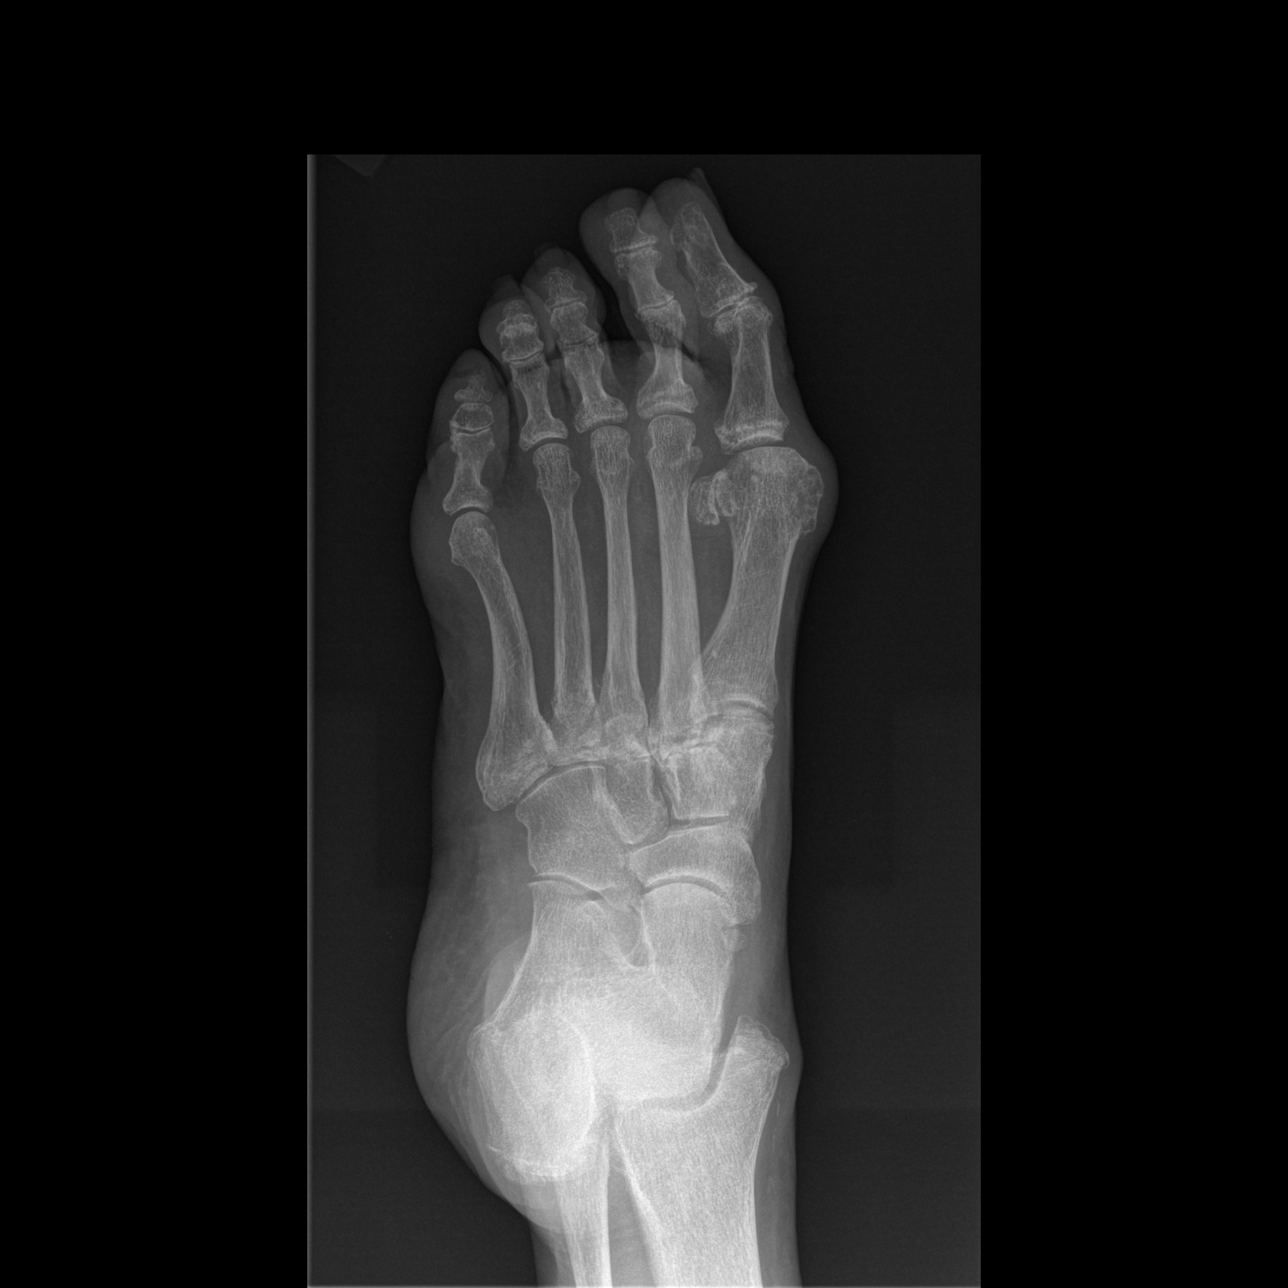

[t foot lat left]
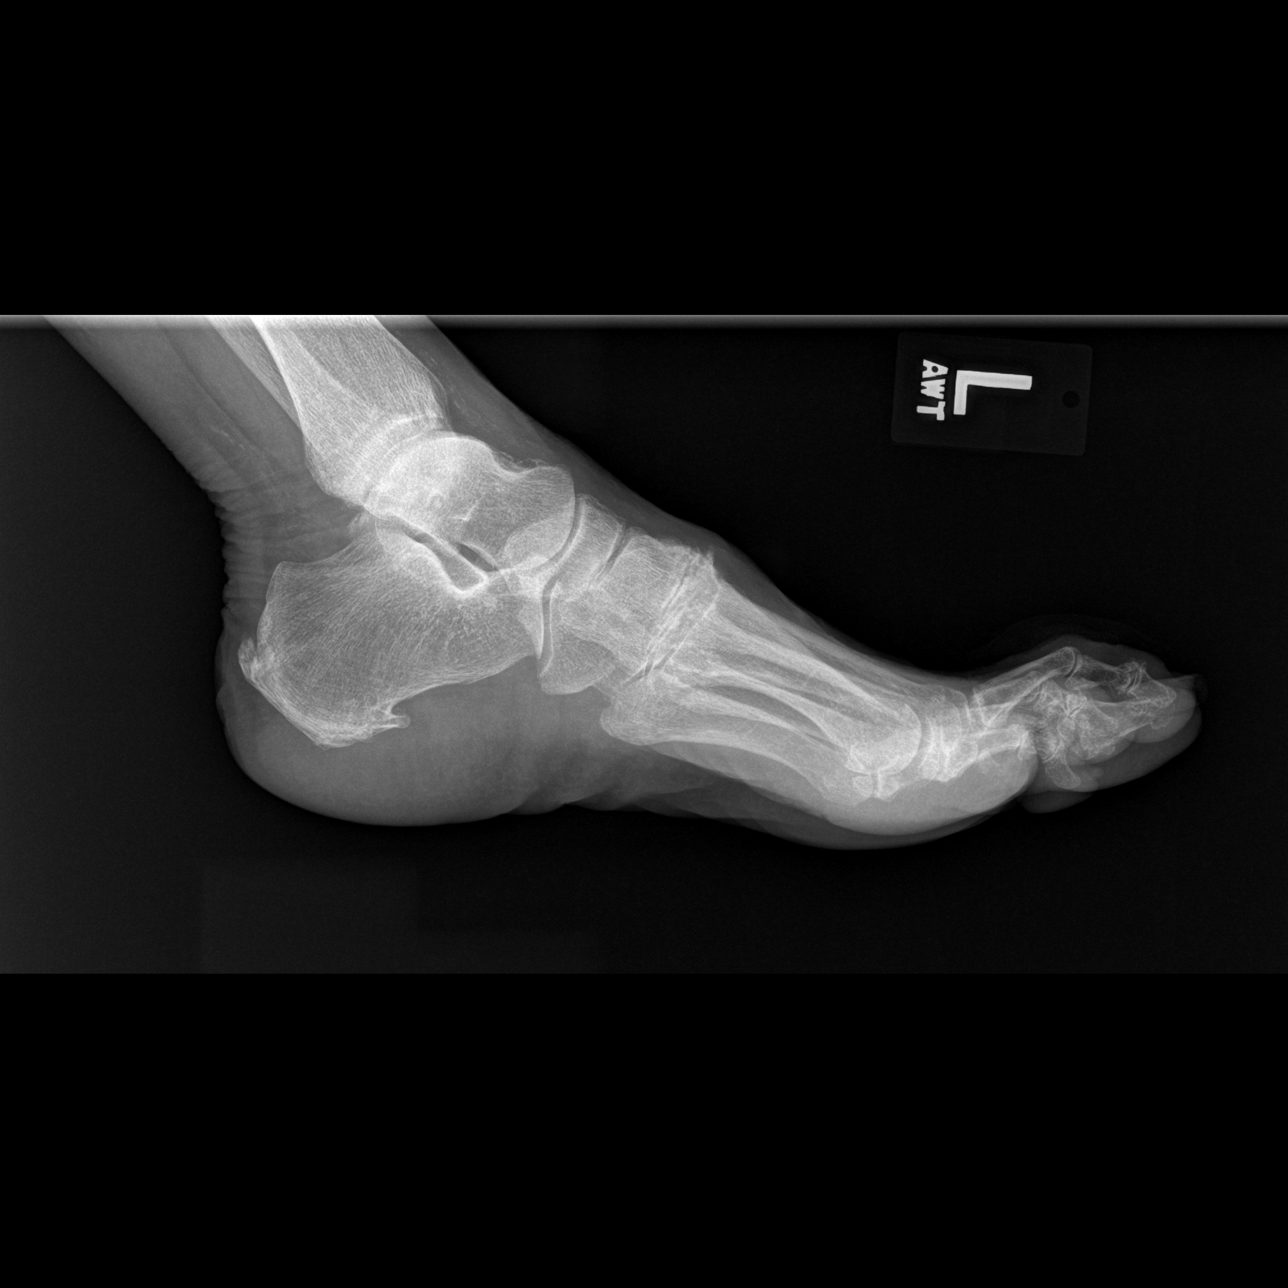

[3 of 3 positions shown; findings below may reference images not displayed]

FINDINGS: Diffuse degenerative change. No evidence of fracture or dislocation.
No acute abnormality.
IMPRESSION: Diffuse degenerative change.  No acute abnormality.

## 2018-11-07 DIAGNOSIS — M792 Neuralgia and neuritis, unspecified: Secondary | ICD-10-CM | POA: Insufficient documentation

## 2019-02-27 ENCOUNTER — Ambulatory Visit: Payer: Medicare Other

## 2019-03-15 ENCOUNTER — Ambulatory Visit: Payer: Medicare Other | Admitting: Physical Therapy

## 2019-03-17 ENCOUNTER — Ambulatory Visit: Payer: Medicare Other | Attending: Anesthesiology

## 2019-03-17 ENCOUNTER — Other Ambulatory Visit: Payer: Self-pay

## 2019-03-17 DIAGNOSIS — G8929 Other chronic pain: Secondary | ICD-10-CM | POA: Diagnosis present

## 2019-03-17 DIAGNOSIS — M25561 Pain in right knee: Secondary | ICD-10-CM | POA: Insufficient documentation

## 2019-03-17 DIAGNOSIS — M25661 Stiffness of right knee, not elsewhere classified: Secondary | ICD-10-CM | POA: Diagnosis present

## 2019-03-17 DIAGNOSIS — M792 Neuralgia and neuritis, unspecified: Secondary | ICD-10-CM | POA: Diagnosis present

## 2019-03-20 NOTE — Therapy (Signed)
[facility] [address] [place], [place], [address] Phone: [phone]   Fax:  [phone]  Physical Therapy Evaluation  Patient Details  Name: Kristen Pham MRN: [id number] Date of Birth: [birth year] Referring Provider (PT): Dorcas Mcmurray, MD   Encounter Date: 03/17/2019  PT End of Session - 03/20/19 1216    Visit Number  1    Number of Visits  17    Date for PT Re-Evaluation  05/12/19    Authorization Type  [employer] MCR; PN by 10th visit    Progress Note Due on Visit  10    PT Start Time  0930    PT Stop Time  1030    PT Time Calculation (min)  60 min    Activity Tolerance  Patient tolerated treatment well    Behavior During Therapy  [facility] for tasks assessed/performed       Past Medical History:  Diagnosis Date  . Carpal tunnel syndrome   . Hypertension     Past Surgical History:  Procedure Laterality Date  . ABDOMINAL HYSTERECTOMY    . CESAREAN SECTION    . HAND SURGERY     bilateral release   . TONSILECTOMY, ADENOIDECTOMY, BILATERAL MYRINGOTOMY AND TUBES      There were no vitals filed for this visit.   Subjective Assessment - 03/20/19 1207    Subjective  Pt reports knee pain primarily with her R knee which is intermittent with a range of 2-4/10 and 0/10 of the L knee. Pt states being able to compete functional mobility and ADLs with her knee pain limiting walking distance and bothering her more when asc/dsc steps. Pt reports starting CBD oral liquid and topical cream which is helping to decrease her pain.    Currently in Pain?  Yes    Pain Score  2     Pain Location  Knee    Pain Orientation  Right    Pain Descriptors / Indicators  Aching    Pain Type  Chronic pain    Pain Onset  --   2 to 3 days   Pain Frequency  Intermittent    Aggravating Factors   prolonged standing and walking    Pain Relieving Factors  rest, pain meds, and the initiation of CBD    Effect of Pain on Daily Activities  pt reports min to mod  interference c walking, standing, and ADLs    Multiple Pain Sites  Yes    Pain Score  0    Pain Location  Knee    Pain Type  Chronic pain    Pain Frequency  Occasional         [facility] PT Assessment - 03/20/19 0001      Assessment   Medical Diagnosis  R knee pain    Referring Provider (PT)  Dorcas Mcmurray, MD      Precautions   Precautions  None      Restrictions   Weight Bearing Restrictions  No      Home Environment   Living Environment  Private residence    Living Arrangements  Alone   occasional in home assistance   Available Help at Discharge  Personal care attendant    Type of Home  Apartment    Home Access  Stairs to enter    Home Layout  Two level    Alternate Level Stairs-Number of Steps  14    Home Equipment  [employer] - single point  Prior Function   Level of Independence  Independent with basic ADLs;Independent with household mobility without device;Independent with community mobility with device    Vocation  Retired      Copy Status  Within Functional Limits for tasks assessed      Observation/Other Assessments   Skin Integrity  Intact      Coordination   Gross Motor Movements are Fluid and Coordinated  Yes    Fine Motor Movements are Fluid and Coordinated  Yes      Posture/Postural Control   Posture/Postural Control  No significant limitations      ROM / Strength   AROM / PROM / Strength  Strength      AROM   Overall AROM Comments  B hips and L knee WNLs    AROM Assessment Site  Knee    Right/Left Knee  Right    Right Knee Extension  12    Right Knee Flexion  60      Strength   Overall Strength Comments  gross LR strength 4+-5/5      Flexibility   Soft Tissue Assessment /Muscle Length  yes    ITB  R increased tightness      Palpation   Patella mobility  WNLs      Special Tests   Other special tests  Post drawer and medial and collateral ligament tests were negative     Laxity/Instability   Anterior drawer test     Meniscus Tests  McMurray Test      Anterior drawer test   Findings  Negative    Side  Left      McMurray Test   Findings  Negative    Side  Left      Transfers   Comments  Sit to stand was completed c min/mod difficulty c r knee greater than 90d      Ambulation/Gait   Gait Pattern  Decreased stance time - right                Objective measurements completed on examination: See above findings.      [facility] Adult PT Treatment/Exercise - 03/20/19 0001      Exercises   Exercises  Knee/Hip      Knee/Hip Exercises: Stretches   Other Knee/Hip Stretches  Single knee to chest 10x R LE      Knee/Hip Exercises: Aerobic   Other Aerobic  Seated feet slides 5 mins      Knee/Hip Exercises: Supine   Quad Sets Limitations  Both; 10 reps c 3 sec hold                PT Short Term Goals - 03/20/19 1222      PT SHORT TERM GOAL #1   Title  Pt will voice understanding and return demonstration of HEP to address R knee ROM, strength and pain.    Time  3    Period  Weeks    Status  New    Target Date  04/07/19      PT SHORT TERM GOAL #2   Title  Complete 5x sit to stand and TUG within the next 2 treatment sessions    Baseline  Not completed on eval    Time  2    Period  Weeks    Status  New    Target Date  04/03/19        PT Long Term Goals - 03/20/19 1229  PT LONG TERM GOAL #1   Title  Improve 5x sit to stand and TUG by 15% of tested.    Baseline  To be determined from initial values    Time  8    Period  Weeks    Status  New    Target Date  05/15/19      PT LONG TERM GOAL #2   Title  Pt's AROM for the R knee will improve to -6-90d for pain management and improved function and tolerance.    Baseline  -12-60d    Time  8    Period  Weeks    Status  New    Target Date  05/15/19      PT LONG TERM GOAL #3   Title  Pt will report an improved range of R knee pain of 0-4 with functional activities.    Baseline  0-4 with functional activities    Time   8    Period  Weeks    Status  New    Target Date  05/15/19      PT LONG TERM GOAL #4   Title  Ps Foto score will improve to a 45% limitation.    Baseline  56%    Time  8    Period  Weeks    Status  New    Target Date  05/15/19             Plan - 03/20/19 1217    Clinical Impression Statement  Pt presents with chronic pain of of the R knee, Pertinent physical finding include decrease AROM of the R knee 12-60 with increased pain at endrange and an hard endfeel c flexion; incresed pain c palpation of the medial joint line with min swelling noted; and increased lateral knee pain c flexion with associated tightness of the IT band. Pt has min/mod difficulty with sit to stand related to decreased R knee flexion and has a min decreased step length L c decreased stance time R. Pt will benefit from a course of PT 2w8 to improve R knee pain, ROM, IT band flexibility, and functional mobility for sit to stand, ambulation, and steps.    Personal Factors and Comorbidities  Age    Examination-Activity Limitations  Stairs;Squat;Transfers;Stand    Examination-Participation Restrictions  Community Activity    Stability/Clinical Decision Making  Stable/Uncomplicated    Clinical Decision Making  Low    Rehab Potential  Good    PT Frequency  2x / week    PT Duration  8 weeks    PT Treatment/Interventions  Cryotherapy;Electrical Stimulation;Iontophoresis 4mg /ml Dexamethasone;Moist Heat;ADLs/Self Care Home Management;Gait training;Stair training;Functional mobility training;Therapeutic activities;Therapeutic exercise;Balance training;Patient/family education;Neuromuscular re-education;Passive range of motion;Taping    PT Next Visit Plan  Assess tolerance and compliance to HEP, progress ther ex, address ROM R knee and IT band flexibility.    PT Home Exercise Plan  [id number]    Consulted and Agree with Plan of Care  Patient       Patient will benefit from skilled therapeutic intervention in order to  improve the following deficits and impairments:  Abnormal gait, Decreased mobility, Difficulty walking, Decreased activity tolerance, Impaired flexibility  Visit Diagnosis: Chronic pain of right knee - Plan: PT plan of care cert/re-cert  Stiffness of right knee, not elsewhere classified - Plan: PT plan of care cert/re-cert  Neuralgia and neuritis - Plan: PT plan of care cert/re-cert     Problem List Patient Active Problem List   Diagnosis Date  Noted  . Non-pressure chronic ulcer of other part of left foot limited to breakdown of skin ([facility]) 01/05/2017  . Acquired claw toe, left 10/07/2016  . Hard corn 10/07/2016  . Chronic toe pain, left foot 08/25/2016  . 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 Knee pain, bilateral 06/21/2012  . Primary osteoarthritis of both knees 06/21/2012  . Generalized osteoarthritis of hand 06/21/2012    Joellyn Rued MS, PT 03/20/2019, 1:05 PM  [facility] [address] [place], [place], [address] Phone: [phone]   Fax:  [phone]  Name: Marayah Higdon MRN: [id number] Date of Birth: 02/28/1924

## 2019-03-27 ENCOUNTER — Ambulatory Visit: Payer: Medicare Other | Attending: Anesthesiology

## 2019-03-27 ENCOUNTER — Other Ambulatory Visit: Payer: Self-pay

## 2019-03-27 DIAGNOSIS — M25561 Pain in right knee: Secondary | ICD-10-CM | POA: Insufficient documentation

## 2019-03-27 DIAGNOSIS — M25661 Stiffness of right knee, not elsewhere classified: Secondary | ICD-10-CM | POA: Diagnosis present

## 2019-03-27 DIAGNOSIS — G8929 Other chronic pain: Secondary | ICD-10-CM

## 2019-03-27 DIAGNOSIS — M792 Neuralgia and neuritis, unspecified: Secondary | ICD-10-CM | POA: Diagnosis present

## 2019-03-28 NOTE — Therapy (Signed)
[facility] Outpatient Rehabilitation [facility] [address] [place], [place], [address] Phone: [phone]   Fax:  [phone]  Physical Therapy Treatment  Patient Details  Name: Kristen Pham MRN: [id number] Date of Birth: 20-Jun-1924 Referring Provider (PT): Dorcas Mcmurray, MD   Encounter Date: 03/27/2019  PT End of Session - 03/28/19 4166    Visit Number  2    Number of Visits  17    Date for PT Re-Evaluation  05/12/19    Authorization Type  [employer] MCR; PN by 10th visit    Progress Note Due on Visit  10    PT Start Time  1630    PT Stop Time  1715    PT Time Calculation (min)  45 min       Past Medical History:  Diagnosis Date  . Carpal tunnel syndrome   . Hypertension     Past Surgical History:  Procedure Laterality Date  . ABDOMINAL HYSTERECTOMY    . CESAREAN SECTION    . HAND SURGERY     bilateral release   . TONSILECTOMY, ADENOIDECTOMY, BILATERAL MYRINGOTOMY AND TUBES      There were no vitals filed for this visit.  Subjective Assessment - 03/28/19 0647    Subjective  Pt reports she has been completing her exs some. Rates R knee pain 2-3/10.    Patient is accompained by:  Family member   home aide                      [facility] Adult PT Treatment/Exercise - 03/28/19 0001      Ambulation/Gait   Ambulation/Gait  Yes    Gait Pattern  Step-through pattern;Antalgic   R LE     Exercises   Exercises  Knee/Hip      Knee/Hip Exercises: Aerobic   Other Aerobic  Seated feet slides 5 mins      Knee/Hip Exercises: Supine   Quad Sets  Strengthening;AROM;10 reps    Straight Leg Raises  AROM;Strengthening;Both;2 sets;10 reps    Other Supine Knee/Hip Exercises  KTC; bilat; 10x2      Manual Therapy   Manual Therapy  Joint mobilization;Taping             PT Education - 03/28/19 0650    Education Details  Reviewed HEP with SLR added to her her program    Person(s) Educated  Patient    Methods  Explanation;Demonstration;Tactile  cues;Verbal cues    Comprehension  Verbalized understanding;Returned demonstration;Verbal cues required;Tactile cues required       PT Short Term Goals - 03/20/19 1222      PT SHORT TERM GOAL #1   Title  Pt will voice understanding and return demonstration of HEP to address R knee ROM, strength and pain.    Time  3    Period  Weeks    Status  New    Target Date  04/07/19      PT SHORT TERM GOAL #2   Title  Complete 5x sit to stand and TUG within the next 2 treatment sessions    Baseline  Not completed on eval    Time  2    Period  Weeks    Status  New    Target Date  04/03/19        PT Long Term Goals - 03/20/19 1229      PT LONG TERM GOAL #1   Title  Improve 5x sit to stand and TUG by 15% of  tested.    Baseline  To be determined from initial values    Time  8    Period  Weeks    Status  New    Target Date  05/15/19      PT LONG TERM GOAL #2   Title  Pt's AROM for the R knee will improve to -6-90d for pain management and improved function and tolerance.    Baseline  -12-60d    Time  8    Period  Weeks    Status  New    Target Date  05/15/19      PT LONG TERM GOAL #3   Title  Pt will report an improved range of R knee pain of 0-4 with functional activities.    Baseline  0-4 with functional activities    Time  8    Period  Weeks    Status  New    Target Date  05/15/19      PT LONG TERM GOAL #4   Title  Ps Foto score will improve to a 45% limitation.    Baseline  56%    Time  8    Period  Weeks    Status  New    Target Date  05/15/19            Plan - 03/28/19 0654    Clinical Impression Statement  Introduction of distarction and knee mobs to address R knee ext resulted in improvement from -15d to -10d. Pt was encouraged to complete HEP c emphasis on quad sets and SLRs to assist in maintaining the improved ext ROM. pt toleated the treatment without increaed in R knee pain.    PT Treatment/Interventions  Cryotherapy;Electrical Stimulation;Iontophoresis  4mg /ml Dexamethasone;Moist Heat;ADLs/Self Care Home Management;Gait training;Stair training;Functional mobility training;Therapeutic activities;Therapeutic exercise;Balance training;Patient/family education;Neuromuscular re-education;Passive range of motion;Taping    PT Next Visit Plan  Continue to address R knee ext deficit. Assess and address R knee flexion ROM. Progress LE strengthening program.Use modalities as indicated.    PT Home Exercise Plan  [id number]       Patient will benefit from skilled therapeutic intervention in order to improve the following deficits and impairments:     Visit Diagnosis: Chronic pain of right knee  Stiffness of right knee, not elsewhere classified  Neuralgia and neuritis     Problem List Patient Active Problem List   Diagnosis Date Noted  . Non-pressure chronic ulcer of other part of left foot limited to breakdown of skin ([place]) 01/05/2017  . Acquired claw toe, left 10/07/2016  . Hard corn 10/07/2016  . Chronic toe pain, left foot 08/25/2016  . 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 Knee pain, bilateral 06/21/2012  . Primary osteoarthritis of both knees 06/21/2012  . Generalized osteoarthritis of hand 06/21/2012    [facility] Outpatient Rehabilitation [facility] [address] [place], [place], [address] Phone: [phone]   Fax:  [phone]  Name: Kristen Pham MRN: [id number] Date of Birth: 09/17/1924  Gar Ponto MS, PT 03/28/19 11:20 AM

## 2019-03-31 ENCOUNTER — Ambulatory Visit: Payer: Medicare Other

## 2019-04-03 ENCOUNTER — Ambulatory Visit: Payer: Medicare Other

## 2019-04-07 ENCOUNTER — Other Ambulatory Visit: Payer: Self-pay

## 2019-04-07 ENCOUNTER — Ambulatory Visit: Payer: Medicare Other

## 2019-04-07 DIAGNOSIS — G8929 Other chronic pain: Secondary | ICD-10-CM

## 2019-04-07 DIAGNOSIS — M25661 Stiffness of right knee, not elsewhere classified: Secondary | ICD-10-CM

## 2019-04-07 DIAGNOSIS — M792 Neuralgia and neuritis, unspecified: Secondary | ICD-10-CM

## 2019-04-07 DIAGNOSIS — M25561 Pain in right knee: Secondary | ICD-10-CM | POA: Diagnosis not present

## 2019-04-07 NOTE — Therapy (Signed)
[facility] [address] [place], [place], [address] Phone: [phone]   Fax:  [phone]  Physical Therapy Treatment  Patient Details  Name: Kristen Pham MRN: [id number] Date of Birth: 08-09-1924 Referring Provider (PT): Dorcas Mcmurray, MD   Encounter Date: 04/07/2019  PT End of Session - 04/07/19 1152    Visit Number  3    Number of Visits  17    Date for PT Re-Evaluation  05/12/19    Authorization Type  [employer] MCR; PN by 10th visit    PT Start Time  1047    PT Stop Time  1137    PT Time Calculation (min)  50 min    Activity Tolerance  Patient tolerated treatment well    Behavior During Therapy  [facility] for tasks assessed/performed       Past Medical History:  Diagnosis Date  . Carpal tunnel syndrome   . Hypertension     Past Surgical History:  Procedure Laterality Date  . ABDOMINAL HYSTERECTOMY    . CESAREAN SECTION    . HAND SURGERY     bilateral release   . TONSILECTOMY, ADENOIDECTOMY, BILATERAL MYRINGOTOMY AND TUBES      There were no vitals filed for this visit.  Subjective Assessment - 04/07/19 1103    Subjective  Pt reports her R knee is about the smae. Rates today 3/10. pt staes she has not been consistent enough with completing her HEP.         [facility] PT Assessment - 04/07/19 0001      Observation/Other Assessments   Observations  Swelling and tenderness medial compartment of the R knee.      AROM   Right Knee Extension  -9   pre and post treatment.   Right Knee Flexion  80   70 pre treatment                  [facility] Adult PT Treatment/Exercise - 04/07/19 0001      Ambulation/Gait   Ambulation/Gait  Yes    Assistive device  Straight cane    Gait Pattern  Step-through pattern;Antalgic   R LE   Gait Comments  Gait trainiing with the SPC. post instruction, pt use the [facility] in the correct hand and with proper technique. pt was advised to use the [facility] when her R knee is hutring more to help  reduce the stress and pain.      Exercises   Exercises  Knee/Hip      Knee/Hip Exercises: Aerobic   Elliptical  5 mins Level 1      Knee/Hip Exercises: Supine   Quad Sets  Strengthening;AROM;10 reps    Other Supine Knee/Hip Exercises  KTC; bilat; 10 reps      Manual Therapy   Manual Therapy  Joint mobilization    Joint Mobilization  AP fem/tib grade 3; AP  tib/fem grade 3    Other Manual Therapy  CR stretching for knee flex/ext             PT Education - 04/07/19 1138    Education Details  Gait trainiing with the [facility]. Post instruction, pt use the SPC in the correct hand and with proper technique. Pt was advised to use the [facility] when her R knee is hurting more to help reduce the stress and pain. Recommended pt using a cold pack several times a day for 15 mins to help manage R knee swelling and pain.    Person(s) Educated  Patient    Methods  Explanation;Demonstration;Tactile cues;Verbal cues;Handout    Comprehension  Returned demonstration;Verbal cues required;Tactile cues required       PT Short Term Goals - 03/20/19 1222      PT SHORT TERM GOAL #1   Title  Pt will voice understanding and return demonstration of HEP to address R knee ROM, strength and pain.    Time  3    Period  Weeks    Status  New    Target Date  04/07/19      PT SHORT TERM GOAL #2   Title  Complete 5x sit to stand and TUG within the next 2 treatment sessions    Baseline  Not completed on eval    Time  2    Period  Weeks    Status  New    Target Date  04/03/19        PT Long Term Goals - 03/20/19 1229      PT LONG TERM GOAL #1   Title  Improve 5x sit to stand and TUG by 15% of tested.    Baseline  To be determined from initial values    Time  8    Period  Weeks    Status  New    Target Date  05/15/19      PT LONG TERM GOAL #2   Title  Pt's AROM for the R knee will improve to -6-90d for pain management and improved function and tolerance.    Baseline  -12-60d    Time  8    Period   Weeks    Status  New    Target Date  05/15/19      PT LONG TERM GOAL #3   Title  Pt will report an improved range of R knee pain of 0-4 with functional activities.    Baseline  0-4 with functional activities    Time  8    Period  Weeks    Status  New    Target Date  05/15/19      PT LONG TERM GOAL #4   Title  Ps Foto score will improve to a 45% limitation.    Baseline  56%    Time  8    Period  Weeks    Status  New    Target Date  05/15/19            Plan - 04/07/19 1225    Clinical Impression Statement  Pt has had several conflicts with her PT appts, so consistently has impacted progress. Pt reports she has no conflicts with next weeks appts. R knee flexion improved form 70 to 80d with today's session while ext remained at -9d.    PT Treatment/Interventions  Cryotherapy;Electrical Stimulation;Iontophoresis 4mg /ml Dexamethasone;Moist Heat;ADLs/Self Care Home Management;Gait training;Stair training;Functional mobility training;Therapeutic activities;Therapeutic exercise;Balance training;Patient/family education;Neuromuscular re-education;Passive range of motion;Taping    PT Next Visit Plan  Continue LE strengthening, addressing R knee ROM, and complete a trail of lateral patella KT for reduction of pain..    PT Home Exercise Plan  No new exercises       Patient will benefit from skilled therapeutic intervention in order to improve the following deficits and impairments:  Abnormal gait, Decreased mobility, Difficulty walking, Decreased activity tolerance, Impaired flexibility  Visit Diagnosis: Chronic pain of right knee  Stiffness of right knee, not elsewhere classified  Neuralgia and neuritis     Problem List Patient Active Problem List   Diagnosis Date  Noted  . Non-pressure chronic ulcer of other part of left foot limited to breakdown of skin ([place]) 01/05/2017  . Acquired claw toe, left 10/07/2016  . Hard corn 10/07/2016  . Chronic toe pain, left foot 08/25/2016   . 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 Knee pain, bilateral 06/21/2012  . Primary osteoarthritis of both knees 06/21/2012  . Generalized osteoarthritis of hand 06/21/2012    Gar Ponto 04/07/2019, 12:58 PM  [facility] [address] [place], [place], [address] Phone: [phone]   Fax:  [phone]  Name: Ashawnti Tangen MRN: [id number] Date of Birth: 1924/07/07

## 2019-04-10 ENCOUNTER — Ambulatory Visit: Payer: Medicare Other

## 2019-04-10 ENCOUNTER — Other Ambulatory Visit: Payer: Self-pay

## 2019-04-10 DIAGNOSIS — M25661 Stiffness of right knee, not elsewhere classified: Secondary | ICD-10-CM

## 2019-04-10 DIAGNOSIS — G8929 Other chronic pain: Secondary | ICD-10-CM

## 2019-04-10 DIAGNOSIS — M25561 Pain in right knee: Secondary | ICD-10-CM | POA: Diagnosis not present

## 2019-04-10 DIAGNOSIS — M792 Neuralgia and neuritis, unspecified: Secondary | ICD-10-CM

## 2019-04-10 NOTE — Therapy (Signed)
[place], [place], [address] Phone: [phone]   Fax:  [phone]  Physical Therapy Treatment  Patient Details  Name: Kristen Pham MRN: [id number] Date of Birth: [birth year] Referring Provider (PT): Julious Oka, MD   Encounter Date: 04/10/2019  PT End of Session - 04/10/19 1324    Visit Number  4    Number of Visits  17    Date for PT Re-Evaluation  05/12/19    Authorization Type  [employer] MCR; PN by 10th visit    PT Start Time  1054    PT Stop Time  1140    PT Time Calculation (min)  46 min    Activity Tolerance  Patient tolerated treatment well    Behavior During Therapy  [facility] for tasks assessed/performed       Past Medical History:  Diagnosis Date  . Carpal tunnel syndrome   . Hypertension     Past Surgical History:  Procedure Laterality Date  . ABDOMINAL HYSTERECTOMY    . CESAREAN SECTION    . HAND SURGERY     bilateral release   . TONSILECTOMY, ADENOIDECTOMY, BILATERAL MYRINGOTOMY AND TUBES      There were no vitals filed for this visit.  Subjective Assessment - 04/10/19 1058    Subjective  Rates R knee pain 4/10 today, while it was 2/10 yesterday. Pt states pain correlates r knee pain to activity level. Pt states she used the cold pack 1x over the week end and it helped to revieve her R knee pain.         [facility] PT Assessment - 04/10/19 0001      Observation/Other Assessments   Observations  Swelling and tenderness medial compartment of the R knee.      Ambulation/Gait   Ambulation/Gait  Yes    Assistive device  Straight cane    Gait Pattern  Step-through pattern;Antalgic   R LE                  [facility] Adult PT Treatment/Exercise - 04/10/19 0001      Ambulation/Gait   Gait Comments  Gait training with SPC. Post instruction, pt use the SPC in the correct hand and with proper technique.       Exercises   Exercises  Knee/Hip      Knee/Hip Exercises: Supine   Quad  Sets  Strengthening;Right;10 reps    Straight Leg Raises  Strengthening;Right;10 reps    Other Supine Knee/Hip Exercises  KTC; R; 10 reps      Knee/Hip Exercises: Sidelying   Hip ABduction  Strengthening;Right;10 reps      Modalities   Modalities  Cryotherapy   10 mins     Manual Therapy   Manual Therapy  Taping    Manual therapy comments  KT the R patella laterally              PT Education - 04/10/19 1303    Education Details  HEP. To continue to use the KT tape on the R knee as long as you don't experience and increase in R knee pain and or a skin reaction (redness, itching, swelling) remove the KT tape if any of these adverse circumstances occur. Pt was encouraged to use CP several times a day for R knee management.       PT Short Term Goals - 04/10/19 1307      PT SHORT TERM GOAL #1   Title  Initial HEP is  still evolving re: balance between tolerance and strengthening.    Baseline  No program initially    Time  3    Period  Weeks    Status  Partially Met    Target Date  04/07/19      PT SHORT TERM GOAL #2   Title  Complete 5x sit to stand and TUG within the next 2 treatment sessions    Baseline  Not completed on eval. Will complete nest PT session.    Time  2    Period  Weeks    Status  Not Met    Target Date  04/03/19        PT Long Term Goals - 03/20/19 1229      PT LONG TERM GOAL #1   Title  Improve 5x sit to stand and TUG by 15% of tested.    Baseline  To be determined from initial values    Time  8    Period  Weeks    Status  New    Target Date  05/15/19      PT LONG TERM GOAL #2   Title  Pt's AROM for the R knee will improve to -6-90d for pain management and improved function and tolerance.    Baseline  -12-60d    Time  8    Period  Weeks    Status  New    Target Date  05/15/19      PT LONG TERM GOAL #3   Title  Pt will report an improved range of R knee pain of 0-4 with functional activities.    Baseline  0-4 with functional activities     Time  8    Period  Weeks    Status  New    Target Date  05/15/19      PT LONG TERM GOAL #4   Title  Ps Foto score will improve to a 45% limitation.    Baseline  56%    Time  8    Period  Weeks    Status  New    Target Date  05/15/19            Plan - 04/10/19 1313    Clinical Impression Statement  Trial of KT for the R patella laterally completed. Pt reported decreased pressure of her R knee with ambulation after taping. Cold pack was applied after ther ex for 10 mins. Following CP pt reports R knee pain as 0/10.    PT Treatment/Interventions  Cryotherapy;Electrical Stimulation;Iontophoresis '4mg'$ /ml Dexamethasone;Moist Heat;ADLs/Self Care Home Management;Gait training;Stair training;Functional mobility training;Therapeutic activities;Therapeutic exercise;Balance training;Patient/family education;Neuromuscular re-education;Passive range of motion;Taping    PT Next Visit Plan  Assess response to KT lateral patella taping. Continue ther ex for R knee ROM and strengthening.    PT Home Exercise Plan  R clam shells and hip abd were added to the pt's HEP. RMHPAMNP.       Patient will benefit from skilled therapeutic intervention in order to improve the following deficits and impairments:  Abnormal gait, Decreased mobility, Difficulty walking, Decreased activity tolerance, Impaired flexibility  Visit Diagnosis: Chronic pain of right knee  Stiffness of right knee, not elsewhere classified  Neuralgia and neuritis     Problem List Patient Active Problem List   Diagnosis Date Noted  . Non-pressure chronic ulcer of other part of left foot limited to breakdown of skin ([place]) 01/05/2017  . Acquired claw toe, left 10/07/2016  . Hard corn 10/07/2016  . Chronic  toe pain, left foot 08/25/2016  . 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 Knee pain, bilateral 06/21/2012  . Primary osteoarthritis of both knees 06/21/2012  . Generalized osteoarthritis of hand 06/21/2012    Gar Ponto MS,  PT 04/10/19 1:27 PM  [facility] [address] [place], [place], [address] Phone: [phone]   Fax:  [phone]  Name: Kristen Pham MRN: [id number] Date of Birth: April 28, 1924

## 2019-04-14 ENCOUNTER — Other Ambulatory Visit: Payer: Self-pay

## 2019-04-14 ENCOUNTER — Ambulatory Visit: Payer: Medicare Other

## 2019-04-14 DIAGNOSIS — G8929 Other chronic pain: Secondary | ICD-10-CM

## 2019-04-14 DIAGNOSIS — M25661 Stiffness of right knee, not elsewhere classified: Secondary | ICD-10-CM

## 2019-04-14 DIAGNOSIS — M792 Neuralgia and neuritis, unspecified: Secondary | ICD-10-CM

## 2019-04-14 DIAGNOSIS — M25561 Pain in right knee: Secondary | ICD-10-CM | POA: Diagnosis not present

## 2019-04-16 NOTE — Therapy (Signed)
[place], [place], [address] Phone: [phone]   Fax:  [phone]  Physical Therapy Treatment  Patient Details  Name: Kristen Pham MRN: [id number] Date of Birth: 05-18-1924 Referring Provider (PT): Julious Oka, MD   Encounter Date: 04/14/2019  PT End of Session - 04/16/19 1832    Visit Number  5    Number of Visits  17    Date for PT Re-Evaluation  05/12/19    Authorization Type  [employer] MCR; PN by 10th visit    PT Start Time  1103    PT Stop Time  1133    PT Time Calculation (min)  30 min    Activity Tolerance  Patient tolerated treatment well    Behavior During Therapy  [facility] for tasks assessed/performed       Past Medical History:  Diagnosis Date  . Carpal tunnel syndrome   . Hypertension     Past Surgical History:  Procedure Laterality Date  . ABDOMINAL HYSTERECTOMY    . CESAREAN SECTION    . HAND SURGERY     bilateral release   . TONSILECTOMY, ADENOIDECTOMY, BILATERAL MYRINGOTOMY AND TUBES      There were no vitals filed for this visit.  Subjective Assessment - 04/16/19 1814    Subjective  Pt states her R knee feels more supported amd the pain has been less using the kinesiotape.    Patient is accompained by:  Family member                       [facility] Adult PT Treatment/Exercise - 04/16/19 0001      Ambulation/Gait   Gait Pattern  --   L LE     Exercises   Exercises  Knee/Hip      Knee/Hip Exercises: Supine   Quad Sets  Strengthening;Right;10 reps;2 sets    Straight Leg Raises  Strengthening;Right;10 reps;2 sets    Other Supine Knee/Hip Exercises  KTC; R; 10 reps      Knee/Hip Exercises: Sidelying   Hip ABduction  Strengthening;Right;10 reps;2 sets    Clams  10 reps, 2 sets      Manual Therapy   Manual Therapy  Taping    Manual therapy comments  KT the R patella laterally              PT Education - 04/16/19 1830    Education Details  Pt is to  use the KT until 3/30 or until her next PT appt.    Person(s) Educated  Patient    Methods  Explanation    Comprehension  Verbalized understanding       PT Short Term Goals - 04/10/19 1307      PT SHORT TERM GOAL #1   Title  Initial HEP is still evolving re: balance between tolerance and strengthening.    Baseline  No program initially    Time  3    Period  Weeks    Status  Partially Met    Target Date  04/07/19      PT SHORT TERM GOAL #2   Title  Complete 5x sit to stand and TUG within the next 2 treatment sessions    Baseline  Not completed on eval. Will complete nest PT session.    Time  2    Period  Weeks    Status  Not Met    Target Date  04/03/19  PT Long Term Goals - 03/20/19 1229      PT LONG TERM GOAL #1   Title  Improve 5x sit to stand and TUG by 15% of tested.    Baseline  To be determined from initial values    Time  8    Period  Weeks    Status  New    Target Date  05/15/19      PT LONG TERM GOAL #2   Title  Pt's AROM for the R knee will improve to -6-90d for pain management and improved function and tolerance.    Baseline  -12-60d    Time  8    Period  Weeks    Status  New    Target Date  05/15/19      PT LONG TERM GOAL #3   Title  Pt will report an improved range of R knee pain of 0-4 with functional activities.    Baseline  0-4 with functional activities    Time  8    Period  Weeks    Status  New    Target Date  05/15/19      PT LONG TERM GOAL #4   Title  Ps Foto score will improve to a 45% limitation.    Baseline  56%    Time  8    Period  Weeks    Status  New    Target Date  05/15/19            Plan - 04/16/19 1834    Clinical Impression Statement  From pt's report, use of KT appears to be effective in reducing her R knee pian.    PT Treatment/Interventions  Cryotherapy;Electrical Stimulation;Iontophoresis 70m/ml Dexamethasone;Moist Heat;ADLs/Self Care Home Management;Gait training;Stair training;Functional mobility  training;Therapeutic activities;Therapeutic exercise;Balance training;Patient/family education;Neuromuscular re-education;Passive range of motion;Taping    PT Next Visit Plan  Continue to assess the respose of the pt's R knee to KT, efforts to improve pt's LE strength.    PT Home Exercise Plan  No new exs added.       Patient will benefit from skilled therapeutic intervention in order to improve the following deficits and impairments:  Abnormal gait, Decreased mobility, Difficulty walking, Decreased activity tolerance, Impaired flexibility  Visit Diagnosis: Chronic pain of right knee  Stiffness of right knee, not elsewhere classified  Neuralgia and neuritis     Problem List Patient Active Problem List   Diagnosis Date Noted  . Non-pressure chronic ulcer of other part of left foot limited to breakdown of skin [place] 01/05/2017  . Acquired claw toe, left 10/07/2016  . Hard corn 10/07/2016  . Chronic toe pain, left foot 08/25/2016  . 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 Knee pain, bilateral 06/21/2012  . Primary osteoarthritis of both knees 06/21/2012  . Generalized osteoarthritis of hand 06/21/2012    AGar Ponto3/28/2021, 6:45 PM  [facility]GJenn[address] [place] [address]Phone: [phone]  Fax:  [phone] Name: ALiba HulseyMRN: [id number] of Birth: 625-Dec-1926

## 2019-04-17 ENCOUNTER — Ambulatory Visit: Payer: Medicare Other

## 2019-04-17 ENCOUNTER — Other Ambulatory Visit: Payer: Self-pay

## 2019-04-17 DIAGNOSIS — M25661 Stiffness of right knee, not elsewhere classified: Secondary | ICD-10-CM

## 2019-04-17 DIAGNOSIS — G8929 Other chronic pain: Secondary | ICD-10-CM

## 2019-04-17 DIAGNOSIS — M792 Neuralgia and neuritis, unspecified: Secondary | ICD-10-CM

## 2019-04-17 DIAGNOSIS — M25561 Pain in right knee: Secondary | ICD-10-CM

## 2019-04-17 NOTE — Therapy (Signed)
[place], [place], [address] Phone: [phone]   Fax:  [phone]  Physical Therapy Treatment  Patient Details  Name: Kristen Pham MRN: [id number] Date of Birth: [birth year] Referring Provider (PT): Julious Oka, MD   Encounter Date: 04/17/2019  PT End of Session - 04/17/19 1356    Visit Number  6    Number of Visits  17    Date for PT Re-Evaluation  05/12/19    Authorization Type  [employer]HC MCR; PN by 10th visit    Progress Note Due on Visit  10    PT Start Time  1048    PT Stop Time  1130    PT Time Calculation (min)  42 min    Activity Tolerance  Patient tolerated treatment well    Behavior During Therapy  [facility] for tasks assessed/performed       Past Medical History:  Diagnosis Date  . Carpal tunnel syndrome   . Hypertension     Past Surgical History:  Procedure Laterality Date  . ABDOMINAL HYSTERECTOMY    . CESAREAN SECTION    . HAND SURGERY     bilateral release   . TONSILECTOMY, ADENOIDECTOMY, BILATERAL MYRINGOTOMY AND TUBES      There were no vitals filed for this visit.  Subjective Assessment - 04/17/19 1117    Subjective  Pt reports her R knee feels supported with the [place] is having less pain intermittently, and today she is not having R knee pain.[facility] Adult PT Treatment/Exercise - 04/17/19 0001      Exercises   Exercises  Knee/Hip      Knee/Hip Exercises: Supine   Quad Sets  Strengthening;Right;10 reps;2 sets    Straight Leg Raises  Strengthening;Right;10 reps;2 sets    Other Supine Knee/Hip Exercises  KTC; R/L; 10 reps      Knee/Hip Exercises: Sidelying   Hip ABduction  Strengthening;Right;10 reps;2 sets    Clams  10 reps, 2 sets             PT Education - 04/17/19 1355    Education Details  Encouraged pt to complete her HEP once to twice a day.       PT Short Term Goals - 04/10/19 1307      PT SHORT TERM GOAL #1   Title  Initial HEP is still evolving re: balance between tolerance and strengthening.    Baseline  No program initially    Time  3    Period  Weeks    Status  Partially Met    Target Date  04/07/19      PT SHORT TERM GOAL #2   Title  Complete 5x sit to stand and TUG within the next 2 treatment sessions    Baseline  Not completed on eval. Will complete nest PT session.    Time  2    Period  Weeks    Status  Not Met    Target Date  04/03/19        PT Long Term Goals - 03/20/19 1229      PT LONG TERM GOAL #1   Title  Improve 5x sit to stand and TUG by 15% of tested.    Baseline  To be determined from initial values    Time  8    Period  Weeks  Status  New    Target Date  05/15/19      PT LONG TERM GOAL #2   Title  Pt's AROM for the R knee will improve to -6-90d for pain management and improved function and tolerance.    Baseline  -12-60d    Time  8    Period  Weeks    Status  New    Target Date  05/15/19      PT LONG TERM GOAL #3   Title  Pt will report an improved range of R knee pain of 0-4 with functional activities.    Baseline  0-4 with functional activities    Time  8    Period  Weeks    Status  New    Target Date  05/15/19      PT LONG TERM GOAL #4   Title  Ps Foto score will improve to a 45% limitation.    Baseline  56%    Time  8    Period  Weeks    Status  New    Target Date  05/15/19            Plan - 04/17/19 1357    Clinical Impression Statement  COnbination of HEP and KT appears to be reducing pt's level and frequency of her R knee pain.    PT Treatment/Interventions  Cryotherapy;Electrical Stimulation;Iontophoresis 31m/ml Dexamethasone;Moist Heat;ADLs/Self Care Home Management;Gait training;Stair training;Functional mobility training;Therapeutic activities;Therapeutic exercise;Balance training;Patient/family education;Neuromuscular re-education;Passive range of motion;Taping    PT Next Visit Plan  Continue to assess pt's response to KT and  ther ex. Complete caregiver training for application of KT at home of pt's response comtinues to be favorable.    PT Home Exercise Plan  No new exs added.       Patient will benefit from skilled therapeutic intervention in order to improve the following deficits and impairments:  Abnormal gait, Decreased mobility, Difficulty walking, Decreased activity tolerance, Impaired flexibility  Visit Diagnosis: Chronic pain of right knee  Stiffness of right knee, not elsewhere classified  Neuralgia and neuritis     Problem List Patient Active Problem List   Diagnosis Date Noted  . Non-pressure chronic ulcer of other part of left foot limited to breakdown of skin ([place] 01/05/2017  . Acquired claw toe, left 10/07/2016  . Hard corn 10/07/2016  . Chronic toe pain, left foot 08/25/2016  . 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 Knee pain, bilateral 06/21/2012  . Primary osteoarthritis of both knees 06/21/2012  . Generalized osteoarthritis of hand 06/21/2012    AGar Ponto3/29/2021, 2:02 PM  [facility]GG[address] [place] [address]Phone: [phone]  Fax:  [phone] Name: Kristen MoharMRN: [id number] of Birth: 604/16/1926

## 2019-04-24 ENCOUNTER — Encounter: Payer: Self-pay | Admitting: Physical Therapy

## 2019-04-24 ENCOUNTER — Ambulatory Visit: Payer: Medicare Other | Attending: Anesthesiology | Admitting: Physical Therapy

## 2019-04-24 ENCOUNTER — Other Ambulatory Visit: Payer: Self-pay

## 2019-04-24 DIAGNOSIS — M25661 Stiffness of right knee, not elsewhere classified: Secondary | ICD-10-CM | POA: Diagnosis present

## 2019-04-24 DIAGNOSIS — M792 Neuralgia and neuritis, unspecified: Secondary | ICD-10-CM | POA: Insufficient documentation

## 2019-04-24 DIAGNOSIS — M25561 Pain in right knee: Secondary | ICD-10-CM | POA: Diagnosis present

## 2019-04-24 DIAGNOSIS — G8929 Other chronic pain: Secondary | ICD-10-CM | POA: Diagnosis present

## 2019-04-24 NOTE — Therapy (Signed)
[facility] [address] [place], [place], [address] Phone: [phone]   Fax:  [phone]  Physical Therapy Treatment  Patient Details  Name: Kristen Pham MRN: [id number] Date of Birth: [birth year] Referring Provider (PT): Dorcas Mcmurray, MD   Encounter Date: 04/24/2019  PT End of Session - 04/24/19 1103    Visit Number  7    Number of Visits  17    Date for PT Re-Evaluation  05/12/19    Authorization Type  [employer] MCR; PN by 10th visit    PT Start Time  1052    PT Stop Time  1130    PT Time Calculation (min)  38 min    Activity Tolerance  Patient tolerated treatment well    Behavior During Therapy  [facility] for tasks assessed/performed       Past Medical History:  Diagnosis Date  . Carpal tunnel syndrome   . Hypertension     Past Surgical History:  Procedure Laterality Date  . ABDOMINAL HYSTERECTOMY    . CESAREAN SECTION    . HAND SURGERY     bilateral release   . TONSILECTOMY, ADENOIDECTOMY, BILATERAL MYRINGOTOMY AND TUBES      There were no vitals filed for this visit.  Subjective Assessment - 04/24/19 1102    Subjective  Knees have been very good lately. Running late today. Tape still on and It think it hurts.    Currently in Pain?  No/denies         [facility] PT Assessment - 04/24/19 0001      Timed Up and Go Test   Normal TUG (seconds)  13    TUG Comments  16.6 sec with cane        [facility] Adult PT Treatment/Exercise - 04/24/19 0001      Self-Care   Self-Care  Other Self-Care Comments    Other Self-Care Comments   sit to stand, foot position and possible overuse of Lt knee       Knee/Hip Exercises: Aerobic   Stationary Bike  was able to get into the position but unable to use.  Pain increased with knee flexion       Knee/Hip Exercises: Seated   Sit to Sand  1 set;15 reps;without UE support;Other (comment)   cues for using Rt :LE wider stance      Knee/Hip Exercises: Supine   Quad Sets  Strengthening;Both;1  set;10 reps    Straight Leg Raises  Strengthening;Right;10 reps;2 sets      Knee/Hip Exercises: Sidelying   Hip ABduction  Strengthening;Right;10 reps;2 sets    Clams  10 reps, 2 sets             PT Education - 04/24/19 1231    Education Details  transferring sit to stand    Person(s) Educated  Patient    Methods  Explanation    Comprehension  Verbalized understanding;Returned demonstration;Verbal cues required       PT Short Term Goals - 04/24/19 1103      PT SHORT TERM GOAL #1   Title  Initial HEP is still evolving re: balance between tolerance and strengthening.    Status  Achieved      PT SHORT TERM GOAL #2   Title  Complete 5x sit to stand and TUG within the next 2 treatment sessions    Status  Achieved        PT Long Term Goals - 04/24/19 1232      PT LONG TERM  GOAL #1   Title  Improve 5x sit to stand and TUG by 15% of tested.    Baseline  TUG 13 sec without cane, 16 .6 with cane    Status  On-going      PT LONG TERM GOAL #2   Title  Pt's AROM for the R knee will improve to -6-90d for pain management and improved function and tolerance.    Status  On-going      PT LONG TERM GOAL #3   Title  Pt will report an improved range of R knee pain of 0-4 with functional activities.    Status  On-going      PT LONG TERM GOAL #4   Title  Ps Foto score will improve to a 45% limitation.    Status  On-going            Plan - 04/24/19 1233    Clinical Impression Statement  Pt arrived a bit late, needs increased time for comprehension due to [facility].  Attempted bike but unable to tolerate.  Knows HEP.  Worked mostly on sit to stand without UE support and utilizing Rt LE for control and support.  Pain in knee was not mentioned today (other than bike). Chose to keep current tape on.    PT Treatment/Interventions  Cryotherapy;Electrical Stimulation;Iontophoresis 4mg /ml Dexamethasone;Moist Heat;ADLs/Self Care Home Management;Gait training;Stair training;Functional  mobility training;Therapeutic activities;Therapeutic exercise;Balance training;Patient/family education;Neuromuscular re-education;Passive range of motion;Taping    PT Next Visit Plan  teach tape to caregiver? standing LE strength, balance.  Time 5 x STS    PT Home Exercise Plan  quad set, SLR, hip abd, clam, knee to chest    Consulted and Agree with Plan of Care  Patient       Patient will benefit from skilled therapeutic intervention in order to improve the following deficits and impairments:  Abnormal gait, Decreased mobility, Difficulty walking, Decreased activity tolerance, Impaired flexibility  Visit Diagnosis: Chronic pain of right knee  Stiffness of right knee, not elsewhere classified     Problem List Patient Active Problem List   Diagnosis Date Noted  . Non-pressure chronic ulcer of other part of left foot limited to breakdown of skin ([place]) 01/05/2017  . Acquired claw toe, left 10/07/2016  . Hard corn 10/07/2016  . Chronic toe pain, left foot 08/25/2016  . 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 Knee pain, bilateral 06/21/2012  . Primary osteoarthritis of both knees 06/21/2012  . Generalized osteoarthritis of hand 06/21/2012    Abhi Moccia 04/24/2019, 12:37 PM  [facility] [address] [place], [place], [address] Phone: [phone]   Fax:  [phone]  Name: Lashara Urey MRN: [id number] Date of Birth: September 03, 1924  Raeford Razor, PT 04/24/19 12:37 PM Phone: [phone] Fax: [phone]

## 2019-04-24 NOTE — Patient Instructions (Signed)
Prepared By: Karie Mainland [address]  [place], [place]  Phone: [phone]  Step 1  Step 2  Step 3  Sit to Stand with Counter Support sets: 2  reps: 10  hold: 30  daily: 1  weekly: 7 HAVE A WIDER STANCE BUT MAKE SURE YOUR RIGHT KNEE IS BENT . TRY TO USE BOTH KNEES EQUALLY. Reach back if needed with Rt hand to be safe.   Setup  Begin sitting upright in an armchair with a counter in front of you within arms reach. Movement  Lean your torso forward, then press up through your hands and feet to stand up. Slowly sit back down using the armrests for support and repeat. Tip  Make sure to use the counter to help you balance as you stand up and try to keep your weight evenly distributed between both legs. Do not lock your knees when you are standing.

## 2019-04-28 ENCOUNTER — Other Ambulatory Visit: Payer: Self-pay

## 2019-04-28 ENCOUNTER — Ambulatory Visit: Payer: Medicare Other

## 2019-04-28 DIAGNOSIS — G8929 Other chronic pain: Secondary | ICD-10-CM

## 2019-04-28 DIAGNOSIS — M792 Neuralgia and neuritis, unspecified: Secondary | ICD-10-CM

## 2019-04-28 DIAGNOSIS — M25661 Stiffness of right knee, not elsewhere classified: Secondary | ICD-10-CM

## 2019-04-28 DIAGNOSIS — M25561 Pain in right knee: Secondary | ICD-10-CM | POA: Diagnosis not present

## 2019-04-30 NOTE — Therapy (Signed)
[place], [place], [address] Phone: [phone]   Fax:  [phone]  Physical Therapy Treatment  Patient Details  Name: Kristen Pham MRN: [id number] Date of Birth: 11-Jun-1924 Referring Provider (PT): Julious Oka, MD   Encounter Date: 04/28/2019  PT End of Session - 04/30/19 1128    Visit Number  8    Number of Visits  17    Date for PT Re-Evaluation  05/12/19    Authorization Type  [employer] MCR; PN by 10th visit    Progress Note Due on Visit  10    PT Start Time  1054    PT Stop Time  1132    PT Time Calculation (min)  38 min    Activity Tolerance  Patient tolerated treatment well    Behavior During Therapy  [facility] for tasks assessed/performed       Past Medical History:  Diagnosis Date  . Carpal tunnel syndrome   . Hypertension     Past Surgical History:  Procedure Laterality Date  . ABDOMINAL HYSTERECTOMY    . CESAREAN SECTION    . HAND SURGERY     bilateral release   . TONSILECTOMY, ADENOIDECTOMY, BILATERAL MYRINGOTOMY AND TUBES      There were no vitals filed for this visit.  Subjective Assessment - 04/30/19 1120    Subjective  Pt reports her R knee pain is 50-75% better since starting PT and thinks the kinesiotape has been helpful.    Patient is accompained by:  Family member    Currently in Pain?  Yes    Pain Score  0-No pain    Pain Location  Knee    Pain Orientation  Right    Pain Descriptors / Indicators  Aching    Pain Type  Chronic pain    Pain Radiating Towards  NA    Pain Onset  More than a month ago    Aggravating Factors   Steps and prolonged walking    Multiple Pain Sites  No                       [facility] Adult PT Treatment/Exercise - 04/30/19 0001      Knee/Hip Exercises: Supine   Quad Sets  Strengthening;Both;1 set;10 reps    Straight Leg Raises  Strengthening;Right;10 reps;2 sets    Other Supine Knee/Hip Exercises  KTC; R/L; 10 reps      Knee/Hip  Exercises: Sidelying   Hip ABduction  Strengthening;Right;10 reps;2 sets    Clams  10 reps, 2 sets      Manual Therapy   Manual Therapy  Taping    Manual therapy comments  KT the R patella laterally    Instruction to CG,Rowena,who returned demonstration c L knee            PT Education - 04/30/19 1124    Education Details  Education in the application of kinesiotape to CG, Rowena, who was able to return demonstration for the L knee. Pt is to wear KT for 5 days and then is to not use for a day.    Person(s) Educated  Patient;Other (comment)   CG, Rowena   Methods  Explanation;Demonstration;Verbal cues    Comprehension  Verbalized understanding;Returned demonstration;Need further instruction       PT Short Term Goals - 04/24/19 1103      PT SHORT TERM GOAL #1   Title  Initial HEP is still evolving re: balance between  tolerance and strengthening.    Status  Achieved      PT SHORT TERM GOAL #2   Title  Complete 5x sit to stand and TUG within the next 2 treatment sessions    Status  Achieved        PT Long Term Goals - 04/24/19 1232      PT LONG TERM GOAL #1   Title  Improve 5x sit to stand and TUG by 15% of tested.    Baseline  TUG 13 sec without cane, 16 .6 with cane    Status  On-going      PT LONG TERM GOAL #2   Title  Pt's AROM for the R knee will improve to -6-90d for pain management and improved function and tolerance.    Status  On-going      PT LONG TERM GOAL #3   Title  Pt will report an improved range of R knee pain of 0-4 with functional activities.    Status  On-going      PT LONG TERM GOAL #4   Title  Ps Foto score will improve to a 45% limitation.    Status  On-going            Plan - 04/30/19 1129    Clinical Impression Statement  Provided education for the application of KT to CG, Rowena, who returned demonstration. Pt's subjective report indicates improvement in R knee pain. She is currently, not having R knee and overall reports 50-75%  improvement in pain.    PT Treatment/Interventions  Cryotherapy;Electrical Stimulation;Iontophoresis 4mg /ml Dexamethasone;Moist Heat;ADLs/Self Care Home Management;Gait training;Stair training;Functional mobility training;Therapeutic activities;Therapeutic exercise;Balance training;Patient/family education;Neuromuscular re-education;Passive range of motion;Taping    PT Next Visit Plan  Assess 5x STS.       Patient will benefit from skilled therapeutic intervention in order to improve the following deficits and impairments:  Abnormal gait, Decreased mobility, Difficulty walking, Decreased activity tolerance, Impaired flexibility  Visit Diagnosis: Chronic pain of right knee  Stiffness of right knee, not elsewhere classified  Neuralgia and neuritis     Problem List Patient Active Problem List   Diagnosis Date Noted  . Non-pressure chronic ulcer of other part of left foot limited to breakdown of skin ([facility]) 01/05/2017  . Acquired claw toe, left 10/07/2016  . Hard corn 10/07/2016  . Chronic toe pain, left foot 08/25/2016  . 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 Knee pain, bilateral 06/21/2012  . Primary osteoarthritis of both knees 06/21/2012  . Generalized osteoarthritis of hand 06/21/2012    08/21/2012 MS, PT 04/30/19 11:41 AM  [facility] [address] [place], [place], [place] Phone: [phone]   Fax:  [phone]  Name: Kristen Pham MRN: Angelica Ran Date of Birth: 1924-04-09

## 2019-05-01 ENCOUNTER — Ambulatory Visit: Payer: Medicare Other

## 2019-05-01 ENCOUNTER — Other Ambulatory Visit: Payer: Self-pay

## 2019-05-01 DIAGNOSIS — M25561 Pain in right knee: Secondary | ICD-10-CM

## 2019-05-01 DIAGNOSIS — M792 Neuralgia and neuritis, unspecified: Secondary | ICD-10-CM

## 2019-05-01 DIAGNOSIS — M25661 Stiffness of right knee, not elsewhere classified: Secondary | ICD-10-CM

## 2019-05-01 DIAGNOSIS — G8929 Other chronic pain: Secondary | ICD-10-CM

## 2019-05-02 NOTE — Therapy (Signed)
[place], [place], [address] Phone: [phone]   Fax:  [phone]  Physical Therapy Treatment  Patient Details  Name: Kristen Pham MRN: [id number] Date of Birth: 1925-01-01 Referring Provider (PT): Julious Oka, MD   Encounter Date: 05/01/2019  PT End of Session - 05/02/19 0826    Visit Number  9    Number of Visits  17    Date for PT Re-Evaluation  05/12/19    Authorization Type  [employer] MCR; PN by 10th visit    Progress Note Due on Visit  10    PT Start Time  1102    PT Stop Time  1127    PT Time Calculation (min)  25 min    Activity Tolerance  Patient tolerated treatment well    Behavior During Therapy  [facility] for tasks assessed/performed       Past Medical History:  Diagnosis Date  . Carpal tunnel syndrome   . Hypertension     Past Surgical History:  Procedure Laterality Date  . ABDOMINAL HYSTERECTOMY    . CESAREAN SECTION    . HAND SURGERY     bilateral release   . TONSILECTOMY, ADENOIDECTOMY, BILATERAL MYRINGOTOMY AND TUBES      There were no vitals filed for this visit.  Subjective Assessment - 05/01/19 1112    Subjective  Pt reports 0/10 pain of her R knee today.    Patient is accompained by:  Family member         [facility] PT Assessment - 05/02/19 0001      Functional Tests   Functional tests  Other      Other:   Other/ Comments  5x sit to stand 14.7 sec c arm sets                   [facility] Adult PT Treatment/Exercise - 05/02/19 0001      Ambulation/Gait   Gait Pattern  Step-through pattern   s use of SPC   Gait Comments  Gait training s SPC for improve step length and heel /toe patttern      Exercises   Exercises  Knee/Hip      Knee/Hip Exercises: Supine   Quad Sets  Strengthening;Both;1 set;15 reps    Straight Leg Raises  Strengthening;Right;20 reps      Knee/Hip Exercises: Sidelying   Hip ABduction  Strengthening;Right;20 reps    Hip ADduction   Strengthening;Both;1 set;20 reps    Hip ADduction Limitations  add sets c balls    Clams  20 reps, 2 sets             PT Education - 05/02/19 0826    Education Details  Gait training for improve quality with increased step length and heel/toe patttern.    Person(s) Educated  Patient    Methods  Explanation;Demonstration;Tactile cues;Verbal cues    Comprehension  Verbalized understanding;Returned demonstration;Verbal cues required;Tactile cues required;Need further instruction       PT Short Term Goals - 04/24/19 1103      PT SHORT TERM GOAL #1   Title  Initial HEP is still evolving re: balance between tolerance and strengthening.    Status  Achieved      PT SHORT TERM GOAL #2   Title  Complete 5x sit to stand and TUG within the next 2 treatment sessions    Status  Achieved        PT Long Term Goals - 04/24/19 1232  PT LONG TERM GOAL #1   Title  Improve 5x sit to stand and TUG by 15% of tested.    Baseline  TUG 13 sec without cane, 16 .6 with cane    Status  On-going      PT LONG TERM GOAL #2   Title  Pt's AROM for the R knee will improve to -6-90d for pain management and improved function and tolerance.    Status  On-going      PT LONG TERM GOAL #3   Title  Pt will report an improved range of R knee pain of 0-4 with functional activities.    Status  On-going      PT LONG TERM GOAL #4   Title  Ps Foto score will improve to a 45% limitation.    Status  On-going            Plan - 05/02/19 3159    Clinical Impression Statement  Pt demonstrates improved gait quality, pace, step length, pattern, with decrease in R knee pain.5x sit to stand test was within norm for age. Kinesiotape is still intact from last PT session. Pt was lae to today's PT sesiion and needed to leave on time for an eye appoitment following the PT session.    PT Treatment/Interventions  Cryotherapy;Electrical Stimulation;Iontophoresis 4mg /ml Dexamethasone;Moist Heat;ADLs/Self Care Home  Management;Gait training;Stair training;Functional mobility training;Therapeutic activities;Therapeutic exercise;Balance training;Patient/family education;Neuromuscular re-education;Passive range of motion;Taping    PT Next Visit Plan  Complete reassessment       Patient will benefit from skilled therapeutic intervention in order to improve the following deficits and impairments:  Abnormal gait, Decreased mobility, Difficulty walking, Decreased activity tolerance, Impaired flexibility  Visit Diagnosis: Chronic pain of right knee  Stiffness of right knee, not elsewhere classified  Neuralgia and neuritis     Problem List Patient Active Problem List   Diagnosis Date Noted  . Non-pressure chronic ulcer of other part of left foot limited to breakdown of skin ([facility]) 01/05/2017  . Acquired claw toe, left 10/07/2016  . Hard corn 10/07/2016  . Chronic toe pain, left foot 08/25/2016  . 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 Knee pain, bilateral 06/21/2012  . Primary osteoarthritis of both knees 06/21/2012  . Generalized osteoarthritis of hand 06/21/2012    08/21/2012 MS, PT 05/02/19 8:39 AM   [facility] [address] [place], [place], [place] Phone: [phone]   Fax:  [phone]  Name: Kristen Pham MRN: Angelica Ran Date of Birth: 26-Jan-1924

## 2019-05-05 ENCOUNTER — Ambulatory Visit: Payer: Medicare Other

## 2019-05-05 ENCOUNTER — Other Ambulatory Visit: Payer: Self-pay

## 2019-05-05 DIAGNOSIS — M25661 Stiffness of right knee, not elsewhere classified: Secondary | ICD-10-CM

## 2019-05-05 DIAGNOSIS — M792 Neuralgia and neuritis, unspecified: Secondary | ICD-10-CM

## 2019-05-05 DIAGNOSIS — M25561 Pain in right knee: Secondary | ICD-10-CM

## 2019-05-05 DIAGNOSIS — G8929 Other chronic pain: Secondary | ICD-10-CM

## 2019-05-05 NOTE — Therapy (Signed)
[place], [place], [address] Phone: [phone]   Fax:  [phone]  Physical Therapy Treatment  Patient Details  Name: Kristen Pham MRN: [id number] Date of Birth: 12/31/1924 Referring Provider (PT): Kristen Oka, MD   Encounter Date: 05/05/2019  PT End of Session - 05/05/19 1130    Visit Number  10    Number of Visits  17    Date for PT Re-Evaluation  05/12/19    PT Start Time  1050    PT Stop Time  1120    PT Time Calculation (min)  30 min    Activity Tolerance  Patient tolerated treatment well    Behavior During Therapy  Kristen Pham for tasks assessed/performed       Past Medical History:  Diagnosis Date  . Carpal tunnel syndrome   . Hypertension     Past Surgical History:  Procedure Laterality Date  . ABDOMINAL HYSTERECTOMY    . CESAREAN SECTION    . HAND SURGERY     bilateral release   . TONSILECTOMY, ADENOIDECTOMY, BILATERAL MYRINGOTOMY AND TUBES      There were no vitals filed for this visit.  Subjective Assessment - 05/05/19 1106    Subjective  Pt reports R knee pain of 1/10.    Patient is accompained by:  Family member    Currently in Pain?  Yes    Pain Score  1     Pain Location  Knee    Pain Orientation  Right;Anterior;Medial    Pain Descriptors / Indicators  Aching    Pain Type  Chronic pain    Pain Radiating Towards  NA    Pain Onset  More than Kristen month ago    Pain Frequency  Intermittent    Aggravating Factors   STeps and prolonged walking    Pain Relieving Factors  Rest, meds, aCBD    Effect of Pain on Daily Activities  Pt reports min interference c walking, standing, and ADLs    Multiple Pain Sites  No         [facility] PT Assessment - 05/05/19 0001      Ambulation/Gait   Gait Pattern  Step-through pattern   s use of SPC   Gait Comments  Gait training s SPC for improve step length and heel /toe patttern      Timed Up and Go Test   Normal TUG (seconds)  10.8                    [facility] Adult PT Treatment/Exercise - 05/05/19 0001      Exercises   Exercises  Knee/Hip      Knee/Hip Exercises: Supine   Quad Sets  Strengthening;Both;1 set;15 reps    Straight Leg Raises  Strengthening;Right;15 reps;2 sets      Knee/Hip Exercises: Sidelying   Hip ABduction  Strengthening;Right;15 reps;2 sets    Hip ADduction  Strengthening;Both;15 reps;2 sets    Hip ADduction Limitations  add sets c balls    Clams  15 reps, 2 sets      Manual Therapy   Manual Therapy  --    Manual therapy comments  --   Instruction to CG,Kristen Pham,who returned demonstration c L knee              PT Short Term Goals - 04/24/19 1103      PT SHORT TERM GOAL #1   Title  Initial HEP is still evolving re: balance between  tolerance and strengthening.    Status  Achieved      PT SHORT TERM GOAL #2   Title  Complete 5x sit to stand and TUG within the next 2 treatment sessions    Status  Achieved        PT Long Term Goals - 05/05/19 1147      PT LONG TERM GOAL #1   Title  Improve 5x sit to stand and TUG by 15% of tested. Met-10.8 s SPC    Baseline  TUG 13 sec without cane, 16 .6 with cane    Time  8    Period  Weeks    Status  Achieved      PT LONG TERM GOAL #2   Title  Pt's AROM for the R knee will improve to -6-90d for pain management and improved function and tolerance. ongoing- 9-73d    Baseline  -12-60d    Time  8    Period  Weeks    Status  On-going      PT LONG TERM GOAL #3   Title  Pt will report an improved range of R knee pain of 0-4 with functional activities. Partial met- 0-4    Time  8    Period  Weeks    Status  On-going            Plan - 05/05/19 1150    Clinical Impression Statement  Pt continues to report better contolled R knee pain and pt demonstrates improved functional measures, ie, Improved  TUG s SPC 10.8 sec. Improved R knee ROM 9-73d    PT Treatment/Interventions  Cryotherapy;Electrical Stimulation;Iontophoresis 21m/ml  Dexamethasone;Moist Heat;ADLs/Self Care Home Management;Gait training;Stair training;Functional mobility training;Therapeutic activities;Therapeutic exercise;Balance training;Patient/family education;Neuromuscular re-education;Passive range of motion;Taping    PT Next Visit Plan  Continue PT ther ex to R LE address ROM, strength and functional mobility.       Patient will benefit from skilled therapeutic intervention in order to improve the following deficits and impairments:  Abnormal gait, Decreased mobility, Difficulty walking, Decreased activity tolerance, Impaired flexibility  Visit Diagnosis: Chronic pain of right knee  Stiffness of right knee, not elsewhere classified  Neuralgia and neuritis     Problem List Patient Active Problem List   Diagnosis Date Noted  . Non-pressure chronic ulcer of other part of left [place] 01/05/2017  . Acquired claw toe, left 10/07/2016  . Hard corn 10/07/2016  . Chronic toe pain, left foot 08/25/2016  . 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 Knee pain, bilateral 06/21/2012  . Primary osteoarthritis of both knees 06/21/2012  . Generalized osteoarthritis of hand 06/21/2012    AGar PontoMS, PT 05/05/19 11:58 AM  [facility] [address]Phone: [phone]  Fax:  [phone] Name: AFreddie NghiemMRN: [id number] of Birth: [birth year]

## 2019-05-08 ENCOUNTER — Other Ambulatory Visit: Payer: Self-pay

## 2019-05-08 ENCOUNTER — Ambulatory Visit: Payer: Medicare Other

## 2019-05-08 DIAGNOSIS — G8929 Other chronic pain: Secondary | ICD-10-CM

## 2019-05-08 DIAGNOSIS — M25661 Stiffness of right knee, not elsewhere classified: Secondary | ICD-10-CM

## 2019-05-08 DIAGNOSIS — M792 Neuralgia and neuritis, unspecified: Secondary | ICD-10-CM

## 2019-05-08 DIAGNOSIS — M25561 Pain in right knee: Secondary | ICD-10-CM | POA: Diagnosis not present

## 2019-05-08 NOTE — Therapy (Signed)
[place], [place], [address] Phone: [phone]   Fax:  [phone]  Physical Therapy Treatment  Patient Details  Name: Kristen Pham MRN: [id number] Date of Birth: [birth year] Referring Provider (PT): Kristen Oka, MD   Encounter Date: 05/08/2019  PT End of Session - 05/08/19 1227    Visit Number  11    Number of Visits  17    Date for PT Re-Evaluation  05/12/19    Authorization Type  [employer] MCR; PN by 10th visit    PT Start Time  1058    PT Stop Time  1146    PT Time Calculation (min)  48 min    Activity Tolerance  Patient tolerated treatment well    Behavior During Therapy  [facility] for tasks assessed/performed       Past Medical History:  Diagnosis Date  . Carpal tunnel syndrome   . Hypertension     Past Surgical History:  Procedure Laterality Date  . ABDOMINAL HYSTERECTOMY    . CESAREAN SECTION    . HAND SURGERY     bilateral release   . TONSILECTOMY, ADENOIDECTOMY, BILATERAL MYRINGOTOMY AND TUBES      There were no vitals filed for this visit.  Subjective Assessment - 05/08/19 1220    Subjective  Pt reports she has not experiencing R knee pain voer the past several days.    Currently in Pain?  No/denies    Pain Score  0-No pain    Pain Location  Knee    Pain Orientation  Right                       [facility] Adult PT Treatment/Exercise - 05/08/19 0001      Ambulation/Gait   Gait Pattern  Step-through pattern   s use of SPC   Gait Comments  Gait s SPC with improved pace.      Exercises   Exercises  Knee/Hip      Knee/Hip Exercises: Supine   Quad Sets  Strengthening;Both;1 set;15 reps    Straight Leg Raises  Strengthening;Right;15 reps;2 sets    Other Supine Knee/Hip Exercises  Clam shells c Tband f/b add sets 15x2 each      Knee/Hip Exercises: Sidelying   Hip ABduction  Strengthening;Right;15 reps;2 sets    Clams  15 reps, 2 sets      Manual Therapy   Manual  Therapy  Taping    Manual therapy comments  KT the R patella laterally    Instruction to NZ,VJKQAS,UOR returned demonstration c L knee            PT Education - 05/08/19 1223    Education Details  Kinseotaping application ed. was provided to pt's CG, Kristen Pham, who applied KT to the L knee for practice. CG applied the KT properly. HEP was also reviewed with Kristen Pham.    Person(s) Educated  Patient   CG-Kristen Pham   Methods  Explanation;Demonstration;Tactile cues;Verbal cues;Handout    Comprehension  Verbalized understanding;Returned demonstration;Verbal cues required;Tactile cues required;Need further instruction       PT Short Term Goals - 04/24/19 1103      PT SHORT TERM GOAL #1   Title  Initial HEP is still evolving re: balance between tolerance and strengthening.    Status  Achieved      PT SHORT TERM GOAL #2   Title  Complete 5x sit to stand and TUG within the next 2 treatment sessions  Status  Achieved        PT Long Term Goals - 05/05/19 1147      PT LONG TERM GOAL #1   Title  Improve 5x sit to stand and TUG by 15% of tested. Met-10.8 s SPC    Baseline  TUG 13 sec without cane, 16 .6 with cane    Time  8    Period  Weeks    Status  Achieved      PT LONG TERM GOAL #2   Title  Pt's AROM for the R knee will improve to -6-90d for pain management and improved function and tolerance. ongoing- 9-73d    Baseline  -12-60d    Time  8    Period  Weeks    Status  On-going      PT LONG TERM GOAL #3   Title  Pt will report an improved range of R knee pain of 0-4 with functional activities. Partial met- 0-4    Time  8    Period  Weeks    Status  On-going            Plan - 05/08/19 1229    Clinical Impression Statement  Pt demonstrates improved gait patter, walking s a SPC and with improved pace. Reports of R knee pain continues to improve with her not experiencing r knee pain since the last PT session 3 days ago.    PT Treatment/Interventions  Cryotherapy;Electrical  Stimulation;Iontophoresis 25m/ml Dexamethasone;Moist Heat;ADLs/Self Care Home Management;Gait training;Stair training;Functional mobility training;Therapeutic activities;Therapeutic exercise;Balance training;Patient/family education;Neuromuscular re-education;Passive range of motion;Taping    PT Next Visit Plan  Review Kbe doing well anticipate DC from OPPTT and HEP c pt and CG. If pt continues to    PT Home Exercise Plan  [facility] and hip abd c Tband added       Patient will benefit from skilled therapeutic intervention in order to improve the following deficits and impairments:  Abnormal gait, Decreased mobility, Difficulty walking, Decreased activity tolerance, Impaired flexibility  Visit Diagnosis: Chronic pain of right knee  Stiffness of right knee, not elsewhere classified  Neuralgia and neuritis     Problem List Patient Active Problem List   Diagnosis Date Noted  . Non-pressure chronic ulcer of other part of left foot limited to breakdown of skin ([place] 01/05/2017  . Acquired claw toe, left 10/07/2016  . Hard corn 10/07/2016  . Chronic toe pain, left foot 08/25/2016  . 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 Knee pain, bilateral 06/21/2012  . Primary osteoarthritis of both knees 06/21/2012  . Generalized osteoarthritis of hand 06/21/2012    AGar PontoMS, PT 05/08/19 1:12 PM   [facility]199[address]GSpring [place] [address]Phone: [phone]  Fax:  [phone] Name: ADebrina KizerMRN: [id number] of Birth: [birth year]

## 2019-05-12 ENCOUNTER — Other Ambulatory Visit: Payer: Self-pay

## 2019-05-12 ENCOUNTER — Ambulatory Visit: Payer: Medicare Other

## 2019-05-12 DIAGNOSIS — G8929 Other chronic pain: Secondary | ICD-10-CM

## 2019-05-12 DIAGNOSIS — M792 Neuralgia and neuritis, unspecified: Secondary | ICD-10-CM

## 2019-05-12 DIAGNOSIS — M25561 Pain in right knee: Secondary | ICD-10-CM | POA: Diagnosis not present

## 2019-05-12 DIAGNOSIS — M25661 Stiffness of right knee, not elsewhere classified: Secondary | ICD-10-CM

## 2019-05-13 NOTE — Therapy (Signed)
[place], [place], [address] Phone: [phone]   Fax:  [phone]  Physical Therapy Treatment  Patient Details  Name: Kristen Pham MRN: [id number] Date of Birth: [birth year] Referring Provider (PT): Julious Oka, MD   Encounter Date: 05/12/2019  PT End of Session - 05/13/19 2205    Visit Number  12    PT Start Time  6811    PT Stop Time  1125    PT Time Calculation (min)  34 min    Activity Tolerance  Patient tolerated treatment well    Behavior During Therapy  [facility] for tasks assessed/performed       Past Medical History:  Diagnosis Date  . Carpal tunnel syndrome   . Hypertension     Past Surgical History:  Procedure Laterality Date  . ABDOMINAL HYSTERECTOMY    . CESAREAN SECTION    . HAND SURGERY     bilateral release   . TONSILECTOMY, ADENOIDECTOMY, BILATERAL MYRINGOTOMY AND TUBES      There were no vitals filed for this visit.  Subjective Assessment - 05/13/19 2156    Subjective  Pt reports r knee pain is much improved without pain most of the time. pain range of 0-3/10. Denies pain today.    Patient is accompained by:  Family member    Currently in Pain?  No/denies    Pain Score  0-No pain    Pain Location  Knee    Pain Orientation  Anterior;Medial    Pain Descriptors / Indicators  Aching    Pain Type  Chronic pain    Pain Onset  More than a month ago    Pain Frequency  Intermittent    Aggravating Factors   prolonged walking, sit to/from standing, rests    Pain Relieving Factors  Rest, meds, CBD, kinesiotaping, exercises    Effect of Pain on Daily Activities  Min interrferrence    Multiple Pain Sites  No         [facility] PT Assessment - 05/13/19 0001      AROM   Right Knee Extension  -9    Right Knee Flexion  78      Strength   Overall Strength Comments  R 5/5 for flexion and extension      Ambulation/Gait   Gait Pattern  Step-through pattern   s use of SPC   Gait  Comments  gait s SPC      Timed Up and Go Test   Normal TUG (seconds)  11                   [facility] Adult PT Treatment/Exercise - 05/13/19 0001      Exercises   Exercises  Knee/Hip      Knee/Hip Exercises: Supine   Quad Sets  Strengthening;Both;1 set;10 reps    Straight Leg Raises  Strengthening;Right;10 reps    Other Supine Knee/Hip Exercises  Clam shells c Tband f/b add sets 10 each    Other Supine Knee/Hip Exercises  add sets c ball, 10 reps      Knee/Hip Exercises: Sidelying   Hip ABduction  Strengthening;Right;10 reps    Hip ADduction  Strengthening;Both;10 reps    Hip ADduction Limitations  add sets c balls    Clams  10 reps      Manual Therapy   Manual Therapy  Taping    Manual therapy comments  KT the R patella laterally    Instruction to CG,Rowena,who  returned demonstration c L knee            PT Education - 05/13/19 2205    Education Details  reviewed kinesiotaping c CG, Rowena.    Person(s) Educated  Patient;Caregiver(s)    Methods  Explanation;Demonstration;Tactile cues;Verbal cues    Comprehension  Verbalized understanding;Returned demonstration       PT Short Term Goals - 05/13/19 2211      PT SHORT TERM GOAL #1   Title  Initial HEP is still evolving re: balance between tolerance and strengthening. Met    Baseline  No program initially    Status  Achieved    Target Date  05/12/19      PT SHORT TERM GOAL #2   Title  Complete 5x sit to stand and TUG within the next 2 treatment sessions. Met-Completed and Improved.    Status  Achieved    Target Date  05/12/19        PT Long Term Goals - 05/13/19 2213      PT LONG TERM GOAL #2   Title  Pt's AROM for the R knee will improve to -6-90d for pain management and improved function and tolerance.Partial Met- 9-78d Limited by chronic OA R knee.    Baseline  -12-60d    Status  Partially Met    Target Date  05/12/19      PT LONG TERM GOAL #3   Title  Pt will report an improved range of R  knee pain of 0-4 with functional activities. Met- 0-3    Target Date  05/12/19      PT LONG TERM GOAL #4   Title  Ps Foto score will improve to a 45% limitation. Partial Met-48%    Baseline  56%    Status  Partially Met    Target Date  05/12/19            Plan - 05/13/19 2206    Clinical Impression Statement  Pt walks c a step through gait pattern. TUG gait test continues to be improved. Pain report continues to be improved pain with daily activities. pt is managing R knee pain better and is approprate for DC.    PT Treatment/Interventions  Cryotherapy;Electrical Stimulation;Iontophoresis 24m/ml Dexamethasone;Moist Heat;ADLs/Self Care Home Management;Gait training;Stair training;Functional mobility training;Therapeutic activities;Therapeutic exercise;Balance training;Patient/family education;Neuromuscular re-education;Passive range of motion;Taping    PT Next Visit Plan  DCed    PT Home Exercise Plan  [facility]      Patient will benefit from skilled therapeutic intervention in order to improve the following deficits and impairments:  Abnormal gait, Decreased mobility, Difficulty walking, Decreased activity tolerance, Impaired flexibility  Visit Diagnosis: Chronic pain of right knee  Stiffness of right knee, not elsewhere classified  Neuralgia and neuritis     Problem List Patient Active Problem List   Diagnosis Date Noted  . Non-pressure chronic ulcer of other part of left foot limited to breakdown of skin ([place] 01/05/2017  . Acquired claw toe, left 10/07/2016  . Hard corn 10/07/2016  . Chronic toe pain, left foot 08/25/2016  . 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 Knee pain, bilateral 06/21/2012  . Primary osteoarthritis of both knees 06/21/2012  . Generalized osteoarthritis of hand 06/21/2012   AGar PontoMS, PT 05/13/19 10:27 PM   [facility]GCar[place] [address]Phone: [phone]  Fax:   [phone] Name: Kristen SparkmanMRN: [id number] of Birth: 6Apr 12, 1926

## 2019-08-02 ENCOUNTER — Ambulatory Visit: Payer: Medicare Other | Attending: Internal Medicine

## 2019-08-02 DIAGNOSIS — M25511 Pain in right shoulder: Secondary | ICD-10-CM | POA: Insufficient documentation

## 2019-08-02 DIAGNOSIS — M25512 Pain in left shoulder: Secondary | ICD-10-CM | POA: Insufficient documentation

## 2019-08-02 DIAGNOSIS — G8929 Other chronic pain: Secondary | ICD-10-CM | POA: Insufficient documentation

## 2019-08-02 DIAGNOSIS — M6281 Muscle weakness (generalized): Secondary | ICD-10-CM | POA: Insufficient documentation

## 2019-08-15 ENCOUNTER — Ambulatory Visit: Payer: Medicare Other

## 2019-08-15 ENCOUNTER — Other Ambulatory Visit: Payer: Self-pay

## 2019-08-15 DIAGNOSIS — G8929 Other chronic pain: Secondary | ICD-10-CM | POA: Diagnosis present

## 2019-08-15 DIAGNOSIS — M25511 Pain in right shoulder: Secondary | ICD-10-CM | POA: Diagnosis not present

## 2019-08-15 DIAGNOSIS — M25512 Pain in left shoulder: Secondary | ICD-10-CM

## 2019-08-15 DIAGNOSIS — M6281 Muscle weakness (generalized): Secondary | ICD-10-CM | POA: Diagnosis present

## 2019-08-16 NOTE — Patient Instructions (Signed)
   Cross friction massage to the anterior shoulder 3x daily, 5 mins

## 2019-08-16 NOTE — Therapy (Signed)
[facility] [address] [place], [place], [address] Phone: [phone]   Fax:  [phone]  Physical Therapy Evaluation  Patient Details  Name: Kristen Pham MRN: [id number] Date of Birth: [birth year] Referring Provider (PT): Teena Irani, MD   Encounter Date: 08/15/2019   PT End of Session - 08/16/19 1446    Visit Number 1    Number of Visits 13    Date for PT Re-Evaluation 10/04/19    Authorization Type U[employer] MCR    Progress Note Due on Visit 10    PT Start Time 1538    PT Stop Time 1629    PT Time Calculation (min) 51 min    Activity Tolerance Patient tolerated treatment well    Behavior During Therapy [facility] for tasks assessed/performed           Past Medical History:  Diagnosis Date  . Carpal tunnel syndrome   . Hypertension     Past Surgical History:  Procedure Laterality Date  . ABDOMINAL HYSTERECTOMY    . CESAREAN SECTION    . HAND SURGERY     bilateral release   . TONSILECTOMY, ADENOIDECTOMY, BILATERAL MYRINGOTOMY AND TUBES      There were no vitals filed for this visit.    Subjective Assessment - 08/15/19 1556    Subjective Wrosening bilat shoulder pain over the past 3 months, R > L. Experiences popping with both shoulders    Patient is accompained by: Family member    Pertinent History arthritis    Limitations Lifting;House hold activities    How long can you sit comfortably? not an issue    How long can you stand comfortably? limited due to legs    How long can you walk comfortably? Limited due to legs    Diagnostic tests NA for shoulders    Patient Stated Goals For her shoulders to hurt less with manuvering in bed and reaching    Currently in Pain? Yes    Pain Score 8     Pain Location Shoulder    Pain Orientation Right    Pain Descriptors / Indicators Aching;Sharp    Pain Type Chronic pain    Pain Onset Other (comment)   3 months   Aggravating Factors  moving in bed, pulling covers,  reaching    Pain Relieving Factors Rest    Effect of Pain on Daily Activities Moderate              [facility] PT Assessment - 08/16/19 0001      Assessment   Medical Diagnosis Shoulder pain, unspecified    Referring Provider (PT) Teena Irani, MD    Onset Date/Surgical Date --   3 months   Hand Dominance Right    Prior Therapy No      Precautions   Precautions None      Restrictions   Weight Bearing Restrictions No      Balance Screen   Has the patient fallen in the past 6 months No      Home Environment   Living Environment Private residence    Living Arrangements Alone    Type of Home Other(Comment)   condo   Home Access Level entry    Home Equipment [place] - single point      Prior Function   Level of Independence Independent    Vocation Retired      Health and safety inspector  Posture/Postural Control Postural limitations    Postural Limitations Rounded Shoulders;Forward head      ROM / Strength   AROM / PROM / Strength AROM;Strength      AROM   Overall AROM Comments Pt experienced crepitus of her R shoulder with ROM especially with lowering     AROM Assessment Site Shoulder    Right/Left Shoulder Right;Left    Right Shoulder Flexion 130 Degrees    Right Shoulder ABduction 120 Degrees    Right Shoulder External Rotation 30 Degrees    Left Shoulder Flexion 130 Degrees    Left Shoulder ABduction 120 Degrees    Left Shoulder External Rotation 30 Degrees      Strength   Overall Strength Comments Pt experienced crepitus c resistive testing of the R shoulder    Strength Assessment Site Shoulder    Right/Left Shoulder Right    Right Shoulder Flexion 3+/5    Right Shoulder Extension 4/5    Right Shoulder ABduction 3+/5    Right Shoulder Internal Rotation 4/5    Right Shoulder External Rotation 4-/5      Special Tests    Special Tests --      Transfers   Transfers Sit to Stand;Stand to Sit    Sit to Stand  6: Modified independent (Device/Increase time)      Ambulation/Gait   Ambulation/Gait Yes    Ambulation/Gait Assistance 6: Modified independent (Device/Increase time)    Assistive device Straight cane    Gait Pattern Step-through pattern                      Objective measurements completed on examination: See above findings.               PT Education - 08/16/19 1439    Education Details Eval findings, POC, HEP, measures to manage pain-cross friction massage, heating pad, FOTO    Person(s) Educated Patient    Methods Explanation;Demonstration;Tactile cues;Verbal cues;Handout    Comprehension Verbalized understanding;Returned demonstration;Verbal cues required;Tactile cues required;Need further instruction            PT Short Term Goals - 08/16/19 1525      PT SHORT TERM GOAL #1   Title Pt will be Ind in an Initial HEP    Baseline Started today    Time 3    Period Weeks    Status New    Target Date 09/06/19      PT SHORT TERM GOAL #2   Title Pt will voice understanding of measures to assist with pain management    Time 3    Period Weeks    Status Achieved    Target Date 09/06/19             PT Long Term Goals - 08/16/19 1629      PT LONG TERM GOAL #1   Title Pt will be able to reach overhead with little difficulty c the R UE    Baseline Pt reported much difficulty c the R UE overhead    Time 7    Period Weeks    Status New    Target Date 10/04/19      PT LONG TERM GOAL #2   Title Pt will be able to reach c the R arm to shoulder height with no difficulty    Baseline Pt reports a little difficulty with reaching the r arm to shoulder height    Time 7    Period Weeks  Status New    Target Date 10/04/19      PT LONG TERM GOAL #3   Title Pt will report an improved range of R knee pain of 0-5 with functional activities.    Baseline 0-8 with functional activities    Time 7    Period Weeks    Status New    Target Date 10/04/19        PT LONG TERM GOAL #4   Title Ps Foto score will improve to a 38% limitation for r shoulder use    Baseline 46% limitation    Time 7    Period Weeks    Status New    Target Date 10/04/19      PT LONG TERM GOAL #5   Title Pt will be Ind in a final HEP    Time 7    Period Weeks    Status New    Target Date 10/04/19                  Plan - 08/16/19 1500    Clinical Impression Statement Pt presents with wrosening pain of both shoulders. Today's eval focused on the R shoulder. ROM for the r shoulder was appropriate for pt's age. Some weakness was noted. the pt's most significant issue was crepitus of the GH jt with AROM and resistive movements. Pt will benefit from PT for pain management, ROM, and strengthening to improve pt's functional use of her UEs    Personal Factors and Comorbidities Age;Comorbidity 1;Time since onset of injury/illness/exacerbation    Comorbidities artthritis    Examination-Activity Limitations Carry;Lift;Other;Reach Overhead   bed mobility   Examination-Participation Restrictions Community Activity;Yard Work    Conservation officer, historic buildings Stable/Uncomplicated    Optometrist Low    Rehab Potential Fair    PT Frequency 2x / week    PT Duration 6 weeks    PT Treatment/Interventions Cryotherapy;Electrical Stimulation;Iontophoresis 4mg /ml Dexamethasone;Moist Heat;ADLs/Self Care Home Management;Gait training;Stair training;Functional mobility training;Therapeutic activities;Therapeutic exercise;Balance training;Patient/family education;Neuromuscular re-education;Passive range of motion;Taping;Ultrasound;Manual techniques    PT Next Visit Plan Assess response to HEP and measures for pain management. Review FOTO    PT Home Exercise Plan    Consulted and Agree with Plan of Care Patient;Family member/caregiver    Family Member Consulted Daughter           Patient will benefit from skilled therapeutic intervention in order to  improve the following deficits and impairments:  Decreased mobility, Decreased strength, Postural dysfunction, Decreased range of motion, Pain, Impaired UE functional use  Visit Diagnosis: Chronic pain of both shoulders - Plan: PT plan of care cert/re-cert  Muscle weakness (generalized) - Plan: PT plan of care cert/re-cert     Problem List Patient Active Problem List   Diagnosis Date Noted  . Non-pressure chronic ulcer of other part of left foot limited to breakdown of skin (H[facility]) 01/05/2017  . Acquired claw toe, left 10/07/2016  . Hard corn 10/07/2016  . Chronic toe pain, left foot 08/25/2016  . 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 Knee pain, bilateral 06/21/2012  . Primary osteoarthritis of both knees 06/21/2012  . Generalized osteoarthritis of hand 06/21/2012   08/21/2012 MS, PT 08/16/19 4:44 PM  [facility] [address] [place], [place], [place] Phone: [phone]   Fax:  [phone]  Name: Kristen Pham MRN: [id number] Date of Birth: August 06, 1924

## 2019-08-29 ENCOUNTER — Ambulatory Visit: Payer: Medicare Other | Attending: Anesthesiology

## 2019-08-29 ENCOUNTER — Other Ambulatory Visit: Payer: Self-pay

## 2019-08-29 DIAGNOSIS — M25511 Pain in right shoulder: Secondary | ICD-10-CM | POA: Insufficient documentation

## 2019-08-29 DIAGNOSIS — M25561 Pain in right knee: Secondary | ICD-10-CM | POA: Diagnosis present

## 2019-08-29 DIAGNOSIS — G8929 Other chronic pain: Secondary | ICD-10-CM | POA: Insufficient documentation

## 2019-08-29 DIAGNOSIS — M6281 Muscle weakness (generalized): Secondary | ICD-10-CM | POA: Diagnosis present

## 2019-08-29 DIAGNOSIS — M25512 Pain in left shoulder: Secondary | ICD-10-CM | POA: Diagnosis present

## 2019-08-29 DIAGNOSIS — M25661 Stiffness of right knee, not elsewhere classified: Secondary | ICD-10-CM | POA: Diagnosis present

## 2019-08-29 DIAGNOSIS — M792 Neuralgia and neuritis, unspecified: Secondary | ICD-10-CM | POA: Insufficient documentation

## 2019-08-30 NOTE — Therapy (Signed)
[facility]patient [facility] [address] [place], [place], [address] Phone: [phone]   Fax:  [phone]  Physical Therapy Treatment  Patient Details  Name: Kristen Pham MRN: [id number] Date of Birth: [birth year] Referring Provider (PT): Teena Irani, MD   Encounter Date: 08/29/2019   PT End of Session - 08/30/19 1336    Visit Number 2    Number of Visits 13    Date for PT Re-Evaluation 10/04/19    Authorization Type UHC MCR    Progress Note Due on Visit 10    PT Start Time 1623    PT Stop Time 1705    PT Time Calculation (min) 42 min    Activity Tolerance Patient tolerated treatment well    Behavior During Therapy [facility] for tasks assessed/performed           Past Medical History:  Diagnosis Date  . Carpal tunnel syndrome   . Hypertension     Past Surgical History:  Procedure Laterality Date  . ABDOMINAL HYSTERECTOMY    . CESAREAN SECTION    . HAND SURGERY     bilateral release   . TONSILECTOMY, ADENOIDECTOMY, BILATERAL MYRINGOTOMY AND TUBES      There were no vitals filed for this visit.   Subjective Assessment - 08/29/19 1627    Subjective Pt reports she has been packing to move intoa new apt and she has been surprised that her shoulder has not been bothering her.    Pertinent History arthritis    Limitations Lifting;House hold activities    How long can you sit comfortably? not an issue    How long can you stand comfortably? limited due to legs    How long can you walk comfortably? Limited due to legs    Diagnostic tests NA for shoulders    Patient Stated Goals For her shoulders to hurt less with manuvering in bed and reaching    Currently in Pain? Yes    Pain Score 2     Pain Location Shoulder    Pain Orientation Right    Pain Descriptors / Indicators Aching    Pain Type Chronic pain    Pain Onset Other (comment)    Pain Frequency Intermittent    Aggravating Factors  moving in bed, pulling covers,  reaching    Pain Relieving Factors Rest                             [facility] Adult PT Treatment/Exercise - 08/30/19 0001      Self-Care   Self-Care Other Self-Care Comments    Other Self-Care Comments  See Education/Instructions      Exercises   Exercises Shoulder      Shoulder Exercises: Seated   Retraction AROM;Strengthening;Both;10 reps      Shoulder Exercises: Standing   Internal Rotation AAROM;Both;10 reps    Internal Rotation Limitations wand    Flexion AAROM;10 reps;Right    Flexion Limitations wand      Shoulder Exercises: Isometric Strengthening   Other Isometric Exercises flexion, abd, ext 10x each for 5 sec                  PT Education - 08/30/19 1330    Education Details HEP with explanation/demonstration to pt's daughter. Reviewed results of FOTO.    Person(s) Educated Patient;Child(ren)    Methods Explanation;Demonstration;Tactile cues;Verbal cues;Handout    Comprehension Verbalized understanding;Returned demonstration;Verbal cues required;Tactile cues required;Need further instruction  PT Short Term Goals - 08/16/19 1525      PT SHORT TERM GOAL #1   Title Pt will be Ind in an Initial HEP    Baseline Started today    Time 3    Period Weeks    Status New    Target Date 09/06/19      PT SHORT TERM GOAL #2   Title Pt will voice understanding of measures to assist with pain management    Time 3    Period Weeks    Status Achieved    Target Date 09/06/19             PT Long Term Goals - 08/16/19 1629      PT LONG TERM GOAL #1   Title Pt will be able to reach overhead with little difficulty c the R UE    Baseline Pt reported much difficulty c the R UE overhead    Time 7    Period Weeks    Status New    Target Date 10/04/19      PT LONG TERM GOAL #2   Title Pt will be able to reach c the R arm to shoulder height with no difficulty    Baseline Pt reports a little difficulty with reaching the r arm to  shoulder height    Time 7    Period Weeks    Status New    Target Date 10/04/19      PT LONG TERM GOAL #3   Title Pt will report an improved range of R knee pain of 0-5 with functional activities.    Baseline 0-8 with functional activities    Time 7    Period Weeks    Status New    Target Date 10/04/19      PT LONG TERM GOAL #4   Title Ps Foto score will improve to a 38% limitation for r shoulder use    Baseline 46% limitation    Time 7    Period Weeks    Status New    Target Date 10/04/19      PT LONG TERM GOAL #5   Title Pt will be Ind in a final HEP    Time 7    Period Weeks    Status New    Target Date 10/04/19                 Plan - 08/30/19 1337    Clinical Impression Statement Pt reports decrease in shoulder pain since the IE. PT focused on AAROM and isometriic exs for the R shoulder. Pt returned demonstration and the HEP was covered with pt's daughter.    Personal Factors and Comorbidities Age;Comorbidity 1;Time since onset of injury/illness/exacerbation    Comorbidities artthritis    Examination-Activity Limitations Carry;Lift;Other;Reach Overhead    Stability/Clinical Decision Making Stable/Uncomplicated    Clinical Decision Making Low    Rehab Potential Fair    PT Frequency 2x / week    PT Duration 6 weeks    PT Treatment/Interventions Cryotherapy;Electrical Stimulation;Iontophoresis 4mg /ml Dexamethasone;Moist Heat;ADLs/Self Care Home Management;Gait training;Stair training;Functional mobility training;Therapeutic activities;Therapeutic exercise;Balance training;Patient/family education;Neuromuscular re-education;Passive range of motion;Taping;Ultrasound;Manual techniques    PT Next Visit Plan Assess response to progressed HEP    PT Home Exercise Plan [id number]: Scapular retractions, pendulum, AAROM flexion, ER, IR c wand, and flexion, ext, and isometric exs    Consulted and Agree with Plan of Care Patient;Family member/caregiver    Family Member  Consulted Daughter  Patient will benefit from skilled therapeutic intervention in order to improve the following deficits and impairments:  Decreased mobility, Decreased strength, Postural dysfunction, Decreased range of motion, Pain, Impaired UE functional use  Visit Diagnosis: Chronic pain of both shoulders  Muscle weakness (generalized)     Problem List Patient Active Problem List   Diagnosis Date Noted  . Non-pressure chronic ulcer of other part of left foot limited to breakdown of skin (HCC) 01/05/2017  . Acquired claw toe, left 10/07/2016  . Hard corn 10/07/2016  . Chronic toe pain, left foot 08/25/2016  . 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 Knee pain, bilateral 06/21/2012  . Primary osteoarthritis of both knees 06/21/2012  . Generalized osteoarthritis of hand 06/21/2012    Joellyn Rued MS, PT 08/30/19 1:47 PM  [facility] Outpatient Rehabilitation [facility] [address] [place], [place], [address] Phone: [phone]   Fax:  [phone]  Name: Kristen Pham MRN: [id number] Date of Birth: 03/11/1924

## 2019-09-01 ENCOUNTER — Ambulatory Visit: Payer: Medicare Other

## 2019-09-12 ENCOUNTER — Other Ambulatory Visit: Payer: Self-pay

## 2019-09-12 ENCOUNTER — Ambulatory Visit: Payer: Medicare Other

## 2019-09-12 DIAGNOSIS — M25511 Pain in right shoulder: Secondary | ICD-10-CM | POA: Diagnosis not present

## 2019-09-12 DIAGNOSIS — G8929 Other chronic pain: Secondary | ICD-10-CM

## 2019-09-12 DIAGNOSIS — M6281 Muscle weakness (generalized): Secondary | ICD-10-CM

## 2019-09-12 NOTE — Therapy (Signed)
[facility] Outpatient Rehabilitation [facility] [address] [place], [place], [address] Phone: [phone]   Fax:  [phone]  Physical Therapy Treatment  Patient Details  Name: Kristen Pham MRN: [id number] Date of Birth: 1924-07-26 Referring Provider (PT): Teena Irani, MD   Encounter Date: 09/12/2019   PT End of Session - 09/12/19 1246    Visit Number 3    Number of Visits 13    Date for PT Re-Evaluation 10/04/19    Authorization Type UHC MCR    Progress Note Due on Visit 10    PT Start Time 1047    PT Stop Time 1136    PT Time Calculation (min) 49 min    Activity Tolerance Patient tolerated treatment well    Behavior During Therapy [facility] for tasks assessed/performed           Past Medical History:  Diagnosis Date  . Carpal tunnel syndrome   . Hypertension     Past Surgical History:  Procedure Laterality Date  . ABDOMINAL HYSTERECTOMY    . CESAREAN SECTION    . HAND SURGERY     bilateral release   . TONSILECTOMY, ADENOIDECTOMY, BILATERAL MYRINGOTOMY AND TUBES      There were no vitals filed for this visit.   Subjective Assessment - 09/12/19 1238    Subjective Pt reports she has continued to be involved with moving into a new home and using her R arm alot. At night she can experience 8 shoulder pain of 8/10. Currently, it is 4/10.    Patient is accompained by: Family member    Pertinent History arthritis    Limitations Lifting;House hold activities    Currently in Pain? Yes    Pain Score 4     Pain Location Shoulder    Pain Orientation Right    Pain Descriptors / Indicators Aching    Pain Type Chronic pain    Pain Radiating Towards NA    Pain Onset Other (comment)    Pain Frequency Intermittent                             [facility] Adult PT Treatment/Exercise - 09/12/19 0001      Shoulder Exercises: Seated   Retraction AROM;Strengthening;Both;10 reps      Shoulder Exercises: Standing   External Rotation  AAROM;Both;10 reps    External Rotation Limitations wand    Internal Rotation AAROM;Both;10 reps    Internal Rotation Limitations wand    Flexion AAROM;10 reps;Right    Flexion Limitations wand    Retraction Strengthening;Both;5 reps;Theraband    Retraction Limitations 2 sets      Shoulder Exercises: ROM/Strengthening   Pendulum 10x2 for flex/ext      Shoulder Exercises: Isometric Strengthening   Other Isometric Exercises flexion, abd, ext 10x each for 5 sec                  PT Education - 09/12/19 1245    Education Details Measures for pain management using heat/cold and pendulum exs.    Person(s) Educated Patient    Methods Explanation;Demonstration;Tactile cues;Verbal cues;Handout    Comprehension Verbalized understanding;Returned demonstration;Verbal cues required;Tactile cues required;Need further instruction            PT Short Term Goals - 08/16/19 1525      PT SHORT TERM GOAL #1   Title Pt will be Ind in an Initial HEP    Baseline Started today  Time 3    Period Weeks    Status New    Target Date 09/06/19      PT SHORT TERM GOAL #2   Title Pt will voice understanding of measures to assist with pain management    Time 3    Period Weeks    Status Achieved    Target Date 09/06/19             PT Long Term Goals - 08/16/19 1629      PT LONG TERM GOAL #1   Title Pt will be able to reach overhead with little difficulty c the R UE    Baseline Pt reported much difficulty c the R UE overhead    Time 7    Period Weeks    Status New    Target Date 10/04/19      PT LONG TERM GOAL #2   Title Pt will be able to reach c the R arm to shoulder height with no difficulty    Baseline Pt reports a little difficulty with reaching the r arm to shoulder height    Time 7    Period Weeks    Status New    Target Date 10/04/19      PT LONG TERM GOAL #3   Title Pt will report an improved range of R knee pain of 0-5 with functional activities.    Baseline 0-8  with functional activities    Time 7    Period Weeks    Status New    Target Date 10/04/19      PT LONG TERM GOAL #4   Title Ps Foto score will improve to a 38% limitation for r shoulder use    Baseline 46% limitation    Time 7    Period Weeks    Status New    Target Date 10/04/19      PT LONG TERM GOAL #5   Title Pt will be Ind in a final HEP    Time 7    Period Weeks    Status New    Target Date 10/04/19                 Plan - 09/12/19 1247    Clinical Impression Statement PT focused on measures to decrease R shoulder pain associated with increased activity related to moving including rest,pendulum exs, and heat or cold. Pt was advised her ROM and strengthening exs were not to be completing during time frames she was having a flare up of her shoulder. Ther ex was completing to address ROM and strength.Pt will benefit from PT to address ROM and strength to imptove pai and funtion of the R shoulder.    Personal Factors and Comorbidities Age;Comorbidity 1;Time since onset of injury/illness/exacerbation    Comorbidities artthritis    Examination-Activity Limitations Carry;Lift;Other;Reach Overhead    Examination-Participation Restrictions Community Activity;Yard Work    Conservation officer, historic buildings Stable/Uncomplicated    Optometrist Low    Rehab Potential Fair    PT Frequency 2x / week    PT Duration 6 weeks    PT Treatment/Interventions Cryotherapy;Electrical Stimulation;Iontophoresis 4mg /ml Dexamethasone;Moist Heat;ADLs/Self Care Home Management;Gait training;Stair training;Functional mobility training;Therapeutic activities;Therapeutic exercise;Balance training;Patient/family education;Neuromuscular re-education;Passive range of motion;Taping;Ultrasound;Manual techniques    PT Next Visit Plan Review pain management measures and exs and the difference with ROM/strengthening exs    PT Home Exercise Plan [id number]: Scapular retractions, pendulum, AAROM  flexion, ER, IR c wand, and flexion, ext, isometric exs,  scapular retractions with Tband    Consulted and Agree with Plan of Care Patient;Family member/caregiver    Family Member Consulted Grand son           Patient will benefit from skilled therapeutic intervention in order to improve the following deficits and impairments:  Decreased mobility, Decreased strength, Postural dysfunction, Decreased range of motion, Pain, Impaired UE functional use  Visit Diagnosis: Chronic pain of both shoulders  Muscle weakness (generalized)  Chronic pain of right knee     Problem List Patient Active Problem List   Diagnosis Date Noted  . Non-pressure chronic ulcer of other part of left foot limited to breakdown of skin (HCC) 01/05/2017  . Acquired claw toe, left 10/07/2016  . Hard corn 10/07/2016  . Chronic toe pain, left foot 08/25/2016  . 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 Knee pain, bilateral 06/21/2012  . Primary osteoarthritis of both knees 06/21/2012  . Generalized osteoarthritis of hand 06/21/2012    Joellyn Rued MS, PT 09/12/19 1:06 PM  [facility] Outpatient Rehabilitation [facility] [address] [place], [place], [address] Phone: [phone]   Fax:  [phone]  Name: Kristen Pham MRN: [id number] Date of Birth: 28-Aug-1924

## 2019-09-14 ENCOUNTER — Other Ambulatory Visit: Payer: Self-pay

## 2019-09-14 ENCOUNTER — Ambulatory Visit: Payer: Medicare Other

## 2019-09-14 DIAGNOSIS — M25512 Pain in left shoulder: Secondary | ICD-10-CM

## 2019-09-14 DIAGNOSIS — G8929 Other chronic pain: Secondary | ICD-10-CM

## 2019-09-14 DIAGNOSIS — M25661 Stiffness of right knee, not elsewhere classified: Secondary | ICD-10-CM

## 2019-09-14 DIAGNOSIS — M6281 Muscle weakness (generalized): Secondary | ICD-10-CM

## 2019-09-14 DIAGNOSIS — M25511 Pain in right shoulder: Secondary | ICD-10-CM | POA: Diagnosis not present

## 2019-09-14 DIAGNOSIS — M792 Neuralgia and neuritis, unspecified: Secondary | ICD-10-CM

## 2019-09-14 NOTE — Therapy (Signed)
[facility] Outpatient Rehabilitation [facility] [address] [place], [place], [address] Phone: [phone]   Fax:  [phone]  Physical Therapy Treatment  Patient Details  Name: Kristen Pham MRN: [id number] Date of Birth: 06/18/1924 Referring Provider (PT): Teena Irani, MD   Encounter Date: 09/14/2019   PT End of Session - 09/14/19 0954    Visit Number 4    Number of Visits 13    Date for PT Re-Evaluation 10/04/19    Authorization Type UHC MCR    Progress Note Due on Visit 10    PT Start Time 0922    PT Stop Time 1002    PT Time Calculation (min) 40 min    Activity Tolerance Patient tolerated treatment well    Behavior During Therapy [facility] for tasks assessed/performed           Past Medical History:  Diagnosis Date  . Carpal tunnel syndrome   . Hypertension     Past Surgical History:  Procedure Laterality Date  . ABDOMINAL HYSTERECTOMY    . CESAREAN SECTION    . HAND SURGERY     bilateral release   . TONSILECTOMY, ADENOIDECTOMY, BILATERAL MYRINGOTOMY AND TUBES      There were no vitals filed for this visit.   Subjective Assessment - 09/14/19 0934    Subjective Pt reports she has been using her R arm with moving, but it has not bothered her as much.    Pertinent History arthritis    Patient Stated Goals For her shoulders to hurt less with manuvering in bed and reaching    Currently in Pain? Yes    Pain Score 2     Pain Location Shoulder    Pain Orientation Right    Pain Descriptors / Indicators Aching    Pain Type Chronic pain    Pain Onset Other (comment)    Pain Frequency Intermittent                             [facility] Adult PT Treatment/Exercise - 09/14/19 0001      Exercises   Exercises Shoulder      Shoulder Exercises: Seated   Extension AROM;Strengthening;10 reps    Theraband Level (Shoulder Extension) Level 1 (Yellow)    Extension Limitations 2 sets    Retraction AROM;Strengthening;Both;10 reps     Theraband Level (Shoulder Retraction) Level 1 (Yellow)    Retraction Limitations 2 sets    External Rotation AROM;Strengthening;10 reps    Theraband Level (Shoulder External Rotation) Level 1 (Yellow)    External Rotation Limitations 2 sets      Shoulder Exercises: ROM/Strengthening   UBE (Upper Arm Bike) 4 mins; each direction                   PT Education - 09/14/19 0940    Education Details Reviewed and practiced in/out of bed c use of partial bedrail. HEP.    Person(s) Educated Patient    Methods Explanation;Demonstration;Tactile cues;Verbal cues    Comprehension Verbalized understanding;Returned demonstration;Verbal cues required;Tactile cues required;Need further instruction            PT Short Term Goals - 08/16/19 1525      PT SHORT TERM GOAL #1   Title Pt will be Ind in an Initial HEP    Baseline Started today    Time 3    Period Weeks    Status New    Target Date 09/06/19  PT SHORT TERM GOAL #2   Title Pt will voice understanding of measures to assist with pain management    Time 3    Period Weeks    Status Achieved    Target Date 09/06/19             PT Long Term Goals - 08/16/19 1629      PT LONG TERM GOAL #1   Title Pt will be able to reach overhead with little difficulty c the R UE    Baseline Pt reported much difficulty c the R UE overhead    Time 7    Period Weeks    Status New    Target Date 10/04/19      PT LONG TERM GOAL #2   Title Pt will be able to reach c the R arm to shoulder height with no difficulty    Baseline Pt reports a little difficulty with reaching the r arm to shoulder height    Time 7    Period Weeks    Status New    Target Date 10/04/19      PT LONG TERM GOAL #3   Title Pt will report an improved range of R knee pain of 0-5 with functional activities.    Baseline 0-8 with functional activities    Time 7    Period Weeks    Status New    Target Date 10/04/19      PT LONG TERM GOAL #4   Title Ps Foto  score will improve to a 38% limitation for r shoulder use    Baseline 46% limitation    Time 7    Period Weeks    Status New    Target Date 10/04/19      PT LONG TERM GOAL #5   Title Pt will be Ind in a final HEP    Time 7    Period Weeks    Status New    Target Date 10/04/19                 Plan - 09/14/19 1002    Clinical Impression Statement PT focused on GH/Scapular strengthening. UBE was added to the program at clinic. Instructed in proper method for in/out of bed to reduce r shoulder strain. Pt returned demonstration. Pt reports indicates improved R shoulder pain. ptreportsunderstanding that her R shoulder pain is an issue which she will need to manage.    Personal Factors and Comorbidities Age;Comorbidity 1;Time since onset of injury/illness/exacerbation    Comorbidities artthritis    Examination-Activity Limitations Carry;Lift;Other;Reach Overhead    Examination-Participation Restrictions Community Activity;Yard Work    Conservation officer, historic buildings Stable/Uncomplicated    Optometrist Low    Rehab Potential Fair    PT Frequency 2x / week    PT Duration 6 weeks    PT Treatment/Interventions Cryotherapy;Electrical Stimulation;Iontophoresis 4mg /ml Dexamethasone;Moist Heat;ADLs/Self Care Home Management;Gait training;Stair training;Functional mobility training;Therapeutic activities;Therapeutic exercise;Balance training;Patient/family education;Neuromuscular re-education;Passive range of motion;Taping;Ultrasound;Manual techniques    PT Next Visit Plan Continue ROM and strengthening program to reduce pain and improve function.    PT Home Exercise Plan [id number]: Scapular retractions, pendulum, AAROM flexion, ER, IR c wand, and flexion, ext, isometric exs, scapular retractions and esh. ext with Tband    Consulted and Agree with Plan of Care Patient;Family member/caregiver    Family Member Consulted Grandson           Patient will benefit from skilled  therapeutic intervention in order to improve the  following deficits and impairments:  Decreased mobility, Decreased strength, Postural dysfunction, Decreased range of motion, Pain, Impaired UE functional use  Visit Diagnosis: Chronic pain of both shoulders  Muscle weakness (generalized)  Chronic pain of right knee  Stiffness of right knee, not elsewhere classified  Neuralgia and neuritis     Problem List Patient Active Problem List   Diagnosis Date Noted  . Non-pressure chronic ulcer of other part of left foot limited to breakdown of skin (HCC) 01/05/2017  . Acquired claw toe, left 10/07/2016  . Hard corn 10/07/2016  . Chronic toe pain, left foot 08/25/2016  . 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 Knee pain, bilateral 06/21/2012  . Primary osteoarthritis of both knees 06/21/2012  . Generalized osteoarthritis of hand 06/21/2012    Joellyn Rued MS, PT 09/14/19 11:14 AM  [facility] Outpatient Rehabilitation [facility] [address] [place], [place], [address] Phone: [phone]   Fax:  [phone]  Name: Kristen Pham MRN: [id number] Date of Birth: 24-Nov-1924

## 2019-09-19 ENCOUNTER — Other Ambulatory Visit: Payer: Self-pay

## 2019-09-19 ENCOUNTER — Ambulatory Visit: Payer: Medicare Other

## 2019-09-19 DIAGNOSIS — G8929 Other chronic pain: Secondary | ICD-10-CM

## 2019-09-19 DIAGNOSIS — M25511 Pain in right shoulder: Secondary | ICD-10-CM | POA: Diagnosis not present

## 2019-09-19 DIAGNOSIS — M6281 Muscle weakness (generalized): Secondary | ICD-10-CM

## 2019-09-19 NOTE — Therapy (Signed)
[facility] Outpatient Rehabilitation [facility] [address] [place], [place], [address] Phone: [phone]   Fax:  [phone]  Physical Therapy Treatment  Patient Details  Name: Kristen Pham MRN: [id number] Date of Birth: 05/26/1924 Referring Provider (PT): Teena Irani, MD   Encounter Date: 09/19/2019   PT End of Session - 09/19/19 1046    Visit Number 5    Number of Visits 13    Date for PT Re-Evaluation 10/04/19    Authorization Type UHC MCR    Progress Note Due on Visit 10    PT Start Time 1002    PT Stop Time 1045    PT Time Calculation (min) 43 min    Activity Tolerance Patient tolerated treatment well    Behavior During Therapy [facility] for tasks assessed/performed           Past Medical History:  Diagnosis Date  . Carpal tunnel syndrome   . Hypertension     Past Surgical History:  Procedure Laterality Date  . ABDOMINAL HYSTERECTOMY    . CESAREAN SECTION    . HAND SURGERY     bilateral release   . TONSILECTOMY, ADENOIDECTOMY, BILATERAL MYRINGOTOMY AND TUBES      There were no vitals filed for this visit.   Subjective Assessment - 09/19/19 1012    Subjective pt reports her r shouler is doing welland the exs are helping    Pertinent History arthritis    Patient Stated Goals For her shoulders to hurt less with manuvering in bed and reaching    Currently in Pain? Yes    Pain Score 2     Pain Location Shoulder    Pain Descriptors / Indicators Aching    Pain Type Chronic pain    Pain Onset Other (comment)    Pain Frequency Intermittent    Aggravating Factors  moving in bed and reaching above shoulder or behind her back    Pain Relieving Factors Rest exercise    Effect of Pain on Daily Activities Moderate                             [facility] Adult PT Treatment/Exercise - 09/19/19 0001      Exercises   Exercises Shoulder      Shoulder Exercises: Supine   Protraction Strengthening;Both;10 reps    Protraction  Limitations 1 sets; wand      Shoulder Exercises: Seated   Extension AROM;Strengthening;10 reps    Theraband Level (Shoulder Extension) Level 1 (Yellow)    Extension Limitations 1 sets    Retraction AROM;Strengthening;Both;10 reps    Theraband Level (Shoulder Retraction) Level 1 (Yellow)    Retraction Limitations 1 sets    External Rotation AROM;Strengthening;10 reps    Theraband Level (Shoulder External Rotation) Level 1 (Yellow)    External Rotation Limitations 1 set    Flexion AROM;Strengthening;10 reps    Flexion Limitations 90d; 2 sets      Shoulder Exercises: Standing   External Rotation AAROM;Both;10 reps    External Rotation Limitations wand    Internal Rotation AAROM;10 reps;Right    Internal Rotation Limitations reach towrad opposite back pocket    Flexion AAROM;10 reps;Right    Flexion Limitations wand      Shoulder Exercises: ROM/Strengthening   UBE (Upper Arm Bike) 4 mins; each direction                     PT Short Term Goals -  08/16/19 1525      PT SHORT TERM GOAL #1   Title Pt will be Ind in an Initial HEP    Baseline Started today    Time 3    Period Weeks    Status New    Target Date 09/06/19      PT SHORT TERM GOAL #2   Title Pt will voice understanding of measures to assist with pain management    Time 3    Period Weeks    Status Achieved    Target Date 09/06/19             PT Long Term Goals - 08/16/19 1629      PT LONG TERM GOAL #1   Title Pt will be able to reach overhead with little difficulty c the R UE    Baseline Pt reported much difficulty c the R UE overhead    Time 7    Period Weeks    Status New    Target Date 10/04/19      PT LONG TERM GOAL #2   Title Pt will be able to reach c the R arm to shoulder height with no difficulty    Baseline Pt reports a little difficulty with reaching the r arm to shoulder height    Time 7    Period Weeks    Status New    Target Date 10/04/19      PT LONG TERM GOAL #3   Title  Pt will report an improved range of R knee pain of 0-5 with functional activities.    Baseline 0-8 with functional activities    Time 7    Period Weeks    Status New    Target Date 10/04/19      PT LONG TERM GOAL #4   Title Ps Foto score will improve to a 38% limitation for r shoulder use    Baseline 46% limitation    Time 7    Period Weeks    Status New    Target Date 10/04/19      PT LONG TERM GOAL #5   Title Pt will be Ind in a final HEP    Time 7    Period Weeks    Status New    Target Date 10/04/19                 Plan - 09/19/19 1145    Clinical Impression Statement PT focused on GH/scapular strengthening. Shoulder flexion to 90d seated and protraction in supine were added to session to day. Shoulder IR stretch was modified for comfort. Pt. report regrading pain continues to be improved.    Personal Factors and Comorbidities Age;Comorbidity 1;Time since onset of injury/illness/exacerbation    Comorbidities artthritis    Examination-Activity Limitations Carry;Lift;Other;Reach Overhead    Examination-Participation Restrictions Community Activity;Yard Work    Conservation officer, historic buildings Stable/Uncomplicated    Optometrist Low    Rehab Potential Fair    PT Frequency 2x / week    PT Duration 6 weeks    PT Treatment/Interventions Cryotherapy;Electrical Stimulation;Iontophoresis 4mg /ml Dexamethasone;Moist Heat;ADLs/Self Care Home Management;Gait training;Stair training;Functional mobility training;Therapeutic activities;Therapeutic exercise;Balance training;Patient/family education;Neuromuscular re-education;Passive range of motion;Taping;Ultrasound;Manual techniques    PT Next Visit Plan Complete reassessment. Continue ROM and strengthening program to reduce pain and improve function.    PT Home Exercise Plan [id number]: Scapular retractions, pendulum, AAROM flexion, ER, IR c wand, and flexion, ext, isometric exs, scapular retractions and esh. ext with  Tband  Consulted and Agree with Plan of Care Patient;Family member/caregiver    Family Member Consulted Grandson           Patient will benefit from skilled therapeutic intervention in order to improve the following deficits and impairments:  Decreased mobility, Decreased strength, Postural dysfunction, Decreased range of motion, Pain, Impaired UE functional use  Visit Diagnosis: Chronic pain of both shoulders  Muscle weakness (generalized)  Chronic pain of right knee     Problem List Patient Active Problem List   Diagnosis Date Noted  . Non-pressure chronic ulcer of other part of left foot limited to breakdown of skin (HCC) 01/05/2017  . Acquired claw toe, left 10/07/2016  . Hard corn 10/07/2016  . Chronic toe pain, left foot 08/25/2016  . 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 Knee pain, bilateral 06/21/2012  . Primary osteoarthritis of both knees 06/21/2012  . Generalized osteoarthritis of hand 06/21/2012   Joellyn Rued MS, PT 09/19/19 12:02 PM  [facility] Outpatient Rehabilitation [facility] [address] [place], [place], [address] Phone: [phone]   Fax:  [phone]  Name: Aolanis Crispen MRN: [id number] Date of Birth: [birth year]

## 2019-09-21 ENCOUNTER — Other Ambulatory Visit: Payer: Self-pay

## 2019-09-21 ENCOUNTER — Ambulatory Visit: Payer: Medicare Other | Attending: Anesthesiology

## 2019-09-21 DIAGNOSIS — M25512 Pain in left shoulder: Secondary | ICD-10-CM | POA: Diagnosis present

## 2019-09-21 DIAGNOSIS — G8929 Other chronic pain: Secondary | ICD-10-CM | POA: Diagnosis present

## 2019-09-21 DIAGNOSIS — M6281 Muscle weakness (generalized): Secondary | ICD-10-CM | POA: Insufficient documentation

## 2019-09-21 DIAGNOSIS — M25561 Pain in right knee: Secondary | ICD-10-CM | POA: Diagnosis present

## 2019-09-21 DIAGNOSIS — M792 Neuralgia and neuritis, unspecified: Secondary | ICD-10-CM | POA: Diagnosis present

## 2019-09-21 DIAGNOSIS — M25661 Stiffness of right knee, not elsewhere classified: Secondary | ICD-10-CM | POA: Insufficient documentation

## 2019-09-21 DIAGNOSIS — M25511 Pain in right shoulder: Secondary | ICD-10-CM | POA: Diagnosis not present

## 2019-09-22 NOTE — Therapy (Signed)
[facility] Outpatient Rehabilitation [facility] [address] [place], [place], [address] Phone: [phone]   Fax:  [phone]  Physical Therapy Treatment  Patient Details  Name: Kristen Pham MRN: [id number] Date of Birth: [birth year] Referring Provider (PT): Teena Irani, MD   Encounter Date: 09/21/2019   PT End of Session - 09/21/19 1005    Visit Number 6    Number of Visits 13    Date for PT Re-Evaluation 10/04/19    Authorization Type UHC MCR    Progress Note Due on Visit 10    PT Start Time 0918    PT Stop Time 1002    PT Time Calculation (min) 44 min    Activity Tolerance Patient tolerated treatment well    Behavior During Therapy [facility] for tasks assessed/performed           Past Medical History:  Diagnosis Date  . Carpal tunnel syndrome   . Hypertension     Past Surgical History:  Procedure Laterality Date  . ABDOMINAL HYSTERECTOMY    . CESAREAN SECTION    . HAND SURGERY     bilateral release   . TONSILECTOMY, ADENOIDECTOMY, BILATERAL MYRINGOTOMY AND TUBES      There were no vitals filed for this visit.   Subjective Assessment - 09/21/19 0931    Subjective Pt reports R shoulder pain continues to be improved and as well as use of her R shoulder with reaching and its tolerance to activity.    Currently in Pain? Yes    Pain Score 2     Pain Location Shoulder    Pain Orientation Right    Pain Descriptors / Indicators Aching    Pain Type Chronic pain    Pain Radiating Towards NA    Pain Onset Other (comment)    Pain Frequency Intermittent              [facility] PT Assessment - 09/22/19 0001      AROM   Overall AROM Comments Crepitus with R shoulder flexion and abd    Right Shoulder Flexion 128 Degrees    Right Shoulder ABduction 121 Degrees      Strength   Right Shoulder Flexion 4/5    Right Shoulder Extension 4+/5    Right Shoulder ABduction 4/5    Right Shoulder Internal Rotation 4+/5    Right Shoulder External  Rotation 4/5                         [facility] Adult PT Treatment/Exercise - 09/22/19 0001      Exercises   Exercises Shoulder      Shoulder Exercises: Supine   Protraction Strengthening;Both;10 reps    Protraction Limitations 2 sets; wand      Shoulder Exercises: Seated   Extension AROM;Strengthening;10 reps    Theraband Level (Shoulder Extension) Level 1 (Yellow)    Extension Limitations 2 sets    Retraction AROM;Strengthening;Both;10 reps    Theraband Level (Shoulder Retraction) Level 1 (Yellow)    Retraction Limitations 2 sets    External Rotation AROM;Strengthening;10 reps    Theraband Level (Shoulder External Rotation) Level 1 (Yellow)    External Rotation Limitations 2 set    Flexion AROM;Strengthening;10 reps    Flexion Limitations 90d; 2 sets      Shoulder Exercises: Sidelying   External Rotation Strengthening;Left;10 reps    External Rotation Limitations 3 sets      Shoulder Exercises: ROM/Strengthening   UBE (Upper Arm  Bike) 4 mins; each direction 2 mins    Other ROM/Strengthening Exercises Shoulder ladder 10x                    PT Short Term Goals - 09/22/19 0640      PT SHORT TERM GOAL #1   Title Pt will be Ind in an Initial HEP. Achieved    Target Date 09/21/19      PT SHORT TERM GOAL #2   Title Pt will voice understanding of measures to assist with pain management. Achieved- use of heat or cold, pendulum exs, rest, use of L UE    Target Date 09/21/19             PT Long Term Goals - 08/16/19 1629      PT LONG TERM GOAL #1   Title Pt will be able to reach overhead with little difficulty c the R UE    Baseline Pt reported much difficulty c the R UE overhead    Time 7    Period Weeks    Status New    Target Date 10/04/19      PT LONG TERM GOAL #2   Title Pt will be able to reach c the R arm to shoulder height with no difficulty    Baseline Pt reports a little difficulty with reaching the r arm to shoulder height    Time 7     Period Weeks    Status New    Target Date 10/04/19      PT LONG TERM GOAL #3   Title Pt will report an improved range of R knee pain of 0-5 with functional activities.    Baseline 0-8 with functional activities    Time 7    Period Weeks    Status New    Target Date 10/04/19      PT LONG TERM GOAL #4   Title Ps Foto score will improve to a 38% limitation for r shoulder use    Baseline 46% limitation    Time 7    Period Weeks    Status New    Target Date 10/04/19      PT LONG TERM GOAL #5   Title Pt will be Ind in a final HEP    Time 7    Period Weeks    Status New    Target Date 10/04/19                 Plan - 09/21/19 1001    Clinical Impression Statement Re-assessment reveals appropriate AROM of the R shoulder and improved strength. Creptius is noted with AROM above shoulder and with resisted movements. Pt's reports indicates less pain and improved function.Pt is managing pain by modifiing use of the R UE, heat/cold, rest, and pendulum exs. Pt will benefit from PT to improve R shoulder ROM and strength to maximize R shoulder/UE function    Personal Factors and Comorbidities Age;Comorbidity 1;Time since onset of injury/illness/exacerbation    Comorbidities artthritis    Examination-Activity Limitations Carry;Lift;Other;Reach Overhead    Examination-Participation Restrictions Community Activity;Yard Work    Conservation officer, historic buildings Stable/Uncomplicated    Optometrist Low    Rehab Potential Fair    PT Frequency 2x / week    PT Duration 6 weeks    PT Treatment/Interventions Cryotherapy;Electrical Stimulation;Iontophoresis 4mg /ml Dexamethasone;Moist Heat;ADLs/Self Care Home Management;Gait training;Stair training;Functional mobility training;Therapeutic activities;Therapeutic exercise;Balance training;Patient/family education;Neuromuscular re-education;Passive range of motion;Taping;Ultrasound;Manual techniques    PT Next  Visit Plan Assess and  address R sh PROM and GH jt mobility    PT Home Exercise Plan [id number]: Scapular retractions, pendulum, AAROM flexion, ER, IR c wand, and flexion, ext, isometric exs, scapular retractions and esh. ext with Tband    Consulted and Agree with Plan of Care Patient    Family Member Consulted Grandson           Patient will benefit from skilled therapeutic intervention in order to improve the following deficits and impairments:  Decreased mobility, Decreased strength, Postural dysfunction, Decreased range of motion, Pain, Impaired UE functional use  Visit Diagnosis: Chronic pain of both shoulders  Muscle weakness (generalized)     Problem List Patient Active Problem List   Diagnosis Date Noted  . Non-pressure chronic ulcer of other part of left foot limited to breakdown of skin (HCC) 01/05/2017  . Acquired claw toe, left 10/07/2016  . Hard corn 10/07/2016  . Chronic toe pain, left foot 08/25/2016  . 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 Knee pain, bilateral 06/21/2012  . Primary osteoarthritis of both knees 06/21/2012  . Generalized osteoarthritis of hand 06/21/2012    Joellyn Rued MS, PT 09/22/19 6:59 AM  [facility]patient [facility] [address] [place], [place], [address] Phone: [phone]   Fax:  [phone]  Name: Kristen Pham MRN: [id number] Date of Birth: 12/06/1924

## 2019-09-26 ENCOUNTER — Other Ambulatory Visit: Payer: Self-pay

## 2019-09-26 ENCOUNTER — Ambulatory Visit: Payer: Medicare Other

## 2019-09-26 DIAGNOSIS — M25511 Pain in right shoulder: Secondary | ICD-10-CM

## 2019-09-26 DIAGNOSIS — M25561 Pain in right knee: Secondary | ICD-10-CM

## 2019-09-26 DIAGNOSIS — M6281 Muscle weakness (generalized): Secondary | ICD-10-CM

## 2019-09-26 NOTE — Therapy (Signed)
[facility] Outpatient Rehabilitation [facility] [address] [place], [place], [address] Phone: [phone]   Fax:  [phone]  Physical Therapy Treatment  Patient Details  Name: Kristen Pham MRN: [id number] Date of Birth: 1924-08-02 Referring Provider (PT): Teena Irani, MD   Encounter Date: 09/26/2019   PT End of Session - 09/26/19 1134    Visit Number 7    Number of Visits 13    Date for PT Re-Evaluation 10/04/19    Authorization Type UHC MCR    Progress Note Due on Visit 10    PT Start Time 1051    PT Stop Time 1130    PT Time Calculation (min) 39 min    Activity Tolerance Patient tolerated treatment well    Behavior During Therapy [facility] for tasks assessed/performed           Past Medical History:  Diagnosis Date  . Carpal tunnel syndrome   . Hypertension     Past Surgical History:  Procedure Laterality Date  . ABDOMINAL HYSTERECTOMY    . CESAREAN SECTION    . HAND SURGERY     bilateral release   . TONSILECTOMY, ADENOIDECTOMY, BILATERAL MYRINGOTOMY AND TUBES      There were no vitals filed for this visit.   Subjective Assessment - 09/26/19 1100    Subjective Pt reports her r shoulder pain is overall doing well.    Currently in Pain? Yes    Pain Score 1     Pain Location Shoulder    Pain Orientation Right    Pain Descriptors / Indicators Aching    Pain Type Chronic pain    Pain Onset More than a month ago    Pain Frequency Intermittent    Aggravating Factors  reaching above head    Pain Relieving Factors Rest, exercise    Effect of Pain on Daily Activities Min to moderate                             [facility] Adult PT Treatment/Exercise - 09/26/19 0001      Exercises   Exercises Shoulder      Shoulder Exercises: Supine   Protraction Strengthening;Both;10 reps    Protraction Weight (lbs) 2    Protraction Limitations 2 sets; wand      Shoulder Exercises: Seated   Extension AROM;Strengthening;15 reps     Theraband Level (Shoulder Extension) Level 1 (Yellow)    Extension Limitations 2 sets    Retraction AROM;Strengthening;Both;15 reps    Theraband Level (Shoulder Retraction) Level 1 (Yellow)    Retraction Limitations 2 sets      Shoulder Exercises: Pulleys   Flexion 2 minutes      Shoulder Exercises: ROM/Strengthening   Other ROM/Strengthening Exercises Shoulder ladder 10x      Manual Therapy   Manual Therapy Joint mobilization    Joint Mobilization Sup/inf and lateral grade 2 and 3 distractions of the R GH jt                  PT Education - 09/26/19 1133    Education Details Proper  biomechanics for lifting objects above head to reduce r shoulder strain    Person(s) Educated Patient    Methods Explanation;Demonstration;Tactile cues;Verbal cues    Comprehension Verbalized understanding;Returned demonstration;Verbal cues required;Tactile cues required            PT Short Term Goals - 09/22/19 0640      PT SHORT  TERM GOAL #1   Title Pt will be Ind in an Initial HEP. Achieved    Target Date 09/21/19      PT SHORT TERM GOAL #2   Title Pt will voice understanding of measures to assist with pain management. Achieved- use of heat or cold, pendulum exs, rest, use of L UE    Target Date 09/21/19             PT Long Term Goals - 08/16/19 1629      PT LONG TERM GOAL #1   Title Pt will be able to reach overhead with little difficulty c the R UE    Baseline Pt reported much difficulty c the R UE overhead    Time 7    Period Weeks    Status New    Target Date 10/04/19      PT LONG TERM GOAL #2   Title Pt will be able to reach c the R arm to shoulder height with no difficulty    Baseline Pt reports a little difficulty with reaching the r arm to shoulder height    Time 7    Period Weeks    Status New    Target Date 10/04/19      PT LONG TERM GOAL #3   Title Pt will report an improved range of R knee pain of 0-5 with functional activities.    Baseline 0-8 with  functional activities    Time 7    Period Weeks    Status New    Target Date 10/04/19      PT LONG TERM GOAL #4   Title Ps Foto score will improve to a 38% limitation for r shoulder use    Baseline 46% limitation    Time 7    Period Weeks    Status New    Target Date 10/04/19      PT LONG TERM GOAL #5   Title Pt will be Ind in a final HEP    Time 7    Period Weeks    Status New    Target Date 10/04/19                 Plan - 09/26/19 1623    Clinical Impression Statement Overall, pt continues to be doing better. Instructed pt in body mechanic principles to reduce R shoulder strain with reaching and lifting. Pt returned demonstration. Initiated GH jt mobs to address L shoulder ROM.    Personal Factors and Comorbidities Age;Comorbidity 1;Time since onset of injury/illness/exacerbation    Comorbidities artthritis    Examination-Activity Limitations Carry;Lift;Other;Reach Overhead    Examination-Participation Restrictions Community Activity;Yard Work    Conservation officer, historic buildings Stable/Uncomplicated    Optometrist Low    Rehab Potential Fair    PT Frequency 2x / week    PT Duration 6 weeks    PT Treatment/Interventions Cryotherapy;Electrical Stimulation;Iontophoresis 4mg /ml Dexamethasone;Moist Heat;ADLs/Self Care Home Management;Gait training;Stair training;Functional mobility training;Therapeutic activities;Therapeutic exercise;Balance training;Patient/family education;Neuromuscular re-education;Passive range of motion;Taping;Ultrasound;Manual techniques    PT Next Visit Plan Assess and address R sh PROM and GH jt mobility    PT Home Exercise Plan [id number]: Scapular retractions, pendulum, AAROM flexion, ER, IR c wand, and flexion, ext, isometric exs, scapular retractions and esh. ext with Tband    Consulted and Agree with Plan of Care Patient    Family Member Consulted Daugther           Patient will benefit from skilled therapeutic intervention in  order to improve the following deficits and impairments:  Decreased mobility, Decreased strength, Postural dysfunction, Decreased range of motion, Pain, Impaired UE functional use  Visit Diagnosis: Chronic pain of both shoulders  Muscle weakness (generalized)  Chronic pain of right knee     Problem List Patient Active Problem List   Diagnosis Date Noted  . Non-pressure chronic ulcer of other part of left foot limited to breakdown of skin (HCC) 01/05/2017  . Acquired claw toe, left 10/07/2016  . Hard corn 10/07/2016  . Chronic toe pain, left foot 08/25/2016  . 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 Knee pain, bilateral 06/21/2012  . Primary osteoarthritis of both knees 06/21/2012  . Generalized osteoarthritis of hand 06/21/2012     Joellyn Rued MS, PT 09/26/19 4:36 PM  [facility]patient Rehabilitation [facility] [address] [place], [place], [address] Phone: [phone]   Fax:  [phone]  Name: Kristen Pham MRN: [id number] Date of Birth: [birth year]

## 2019-09-27 ENCOUNTER — Telehealth: Payer: Self-pay | Admitting: Orthopedic Surgery

## 2019-09-27 NOTE — Telephone Encounter (Signed)
I tried calling several times but no answer, see if she wants to come in for evaluation next week

## 2019-09-27 NOTE — Telephone Encounter (Signed)
Patient called requesting a call back from Dr. Lajoyce Corners. Patient states she has questions for the doctor only. Patient is asking for Dr. Lajoyce Corners to call at the end of the day. Patient phone number is [phone].

## 2019-09-27 NOTE — Telephone Encounter (Signed)
Please see below.

## 2019-09-28 ENCOUNTER — Ambulatory Visit: Payer: Medicare Other

## 2019-09-28 NOTE — Telephone Encounter (Signed)
I called and lm on vm to advise of message below. To call for appt

## 2019-10-03 ENCOUNTER — Ambulatory Visit: Payer: Medicare Other | Admitting: Family

## 2019-10-03 ENCOUNTER — Ambulatory Visit: Payer: Medicare Other

## 2019-10-03 ENCOUNTER — Ambulatory Visit: Payer: Medicare Other | Admitting: Orthopedic Surgery

## 2019-10-03 ENCOUNTER — Other Ambulatory Visit: Payer: Self-pay

## 2019-10-03 DIAGNOSIS — M205X2 Other deformities of toe(s) (acquired), left foot: Secondary | ICD-10-CM

## 2019-10-03 DIAGNOSIS — M25511 Pain in right shoulder: Secondary | ICD-10-CM | POA: Diagnosis not present

## 2019-10-03 DIAGNOSIS — G8929 Other chronic pain: Secondary | ICD-10-CM

## 2019-10-03 DIAGNOSIS — M25512 Pain in left shoulder: Secondary | ICD-10-CM

## 2019-10-03 DIAGNOSIS — M6281 Muscle weakness (generalized): Secondary | ICD-10-CM

## 2019-10-03 DIAGNOSIS — M25661 Stiffness of right knee, not elsewhere classified: Secondary | ICD-10-CM

## 2019-10-03 DIAGNOSIS — M792 Neuralgia and neuritis, unspecified: Secondary | ICD-10-CM

## 2019-10-03 NOTE — Therapy (Signed)
[facility] [address] [place], [place], [address] Phone: [phone]   Fax:  [phone]  Physical Therapy Treatment  Patient Details  Name: Kristen Pham MRN: [id number] Date of Birth: 26-Jun-1924 Referring Provider (PT): Teena Irani, MD   Encounter Date: 10/03/2019   PT End of Session - 10/03/19 1055    Visit Number 8    Number of Visits 13    Date for PT Re-Evaluation 10/04/19    Authorization Type UHC MCR    PT Start Time 1045    PT Stop Time 1130    PT Time Calculation (min) 45 min    Activity Tolerance Patient tolerated treatment well    Behavior During Therapy [facility] for tasks assessed/performed           Past Medical History:  Diagnosis Date  . Carpal tunnel syndrome   . Hypertension     Past Surgical History:  Procedure Laterality Date  . ABDOMINAL HYSTERECTOMY    . CESAREAN SECTION    . HAND SURGERY     bilateral release   . TONSILECTOMY, ADENOIDECTOMY, BILATERAL MYRINGOTOMY AND TUBES      There were no vitals filed for this visit.   Subjective Assessment - 10/03/19 1049    Subjective Pt reports her R shoulder is doing well with low intermittent pain. pt staes she has an issue with a sore between the 2nd and 3rd rays of her L foot. She states she is going to see her doctor about it.    Currently in Pain? Yes    Pain Score 2     Pain Location Shoulder    Pain Orientation Right    Pain Descriptors / Indicators Aching    Pain Type Chronic pain    Pain Onset More than a month ago    Pain Frequency Intermittent    Aggravating Factors  reaching above head    Pain Relieving Factors Rest, exercise    Effect of Pain on Daily Activities Min to moderate    Multiple Pain Sites No                             [facility] Adult PT Treatment/Exercise - 10/03/19 0001      Exercises   Exercises Shoulder      Shoulder Exercises: Supine   Protraction Strengthening;Both;10 reps     Protraction Weight (lbs) 2    Protraction Limitations 2 sets; wand      Shoulder Exercises: Seated   Extension AROM;Strengthening;15 reps    Theraband Level (Shoulder Extension) Level 1 (Yellow)    Extension Limitations 2 sets    Retraction AROM;Strengthening;Both;15 reps    Theraband Level (Shoulder Retraction) Level 1 (Yellow)    Retraction Limitations 2 sets    External Rotation AROM;Strengthening;10 reps    Theraband Level (Shoulder External Rotation) Level 1 (Yellow)    External Rotation Limitations 2 set      Shoulder Exercises: Pulleys   Flexion 2 minutes      Shoulder Exercises: ROM/Strengthening   Wall Wash 10x    Other ROM/Strengthening Exercises Shoulder ladder 10x      Manual Therapy   Manual Therapy Joint mobilization    Joint Mobilization Sup/inf and lateral grade 2 and 3 distractions of the R GH jt                    PT Short Term Goals - 09/22/19 2080  PT SHORT TERM GOAL #1   Title Pt will be Ind in an Initial HEP. Achieved    Target Date 09/21/19      PT SHORT TERM GOAL #2   Title Pt will voice understanding of measures to assist with pain management. Achieved- use of heat or cold, pendulum exs, rest, use of L UE    Target Date 09/21/19             PT Long Term Goals - 08/16/19 1629      PT LONG TERM GOAL #1   Title Pt will be able to reach overhead with little difficulty c the R UE    Baseline Pt reported much difficulty c the R UE overhead    Time 7    Period Weeks    Status New    Target Date 10/04/19      PT LONG TERM GOAL #2   Title Pt will be able to reach c the R arm to shoulder height with no difficulty    Baseline Pt reports a little difficulty with reaching the r arm to shoulder height    Time 7    Period Weeks    Status New    Target Date 10/04/19      PT LONG TERM GOAL #3   Title Pt will report an improved range of R knee pain of 0-5 with functional activities.    Baseline 0-8 with functional activities    Time  7    Period Weeks    Status New    Target Date 10/04/19      PT LONG TERM GOAL #4   Title Ps Foto score will improve to a 38% limitation for r shoulder use    Baseline 46% limitation    Time 7    Period Weeks    Status New    Target Date 10/04/19      PT LONG TERM GOAL #5   Title Pt will be Ind in a final HEP    Time 7    Period Weeks    Status New    Target Date 10/04/19                 Plan - 10/03/19 1122    Clinical Impression Statement PT focused on R shoulder strengthening and ROM in pain eliminated or minimized positions. Pt tolerated the session well. Pt's subjective report re: pain continues to be overall improved.    Personal Factors and Comorbidities Age;Comorbidity 1;Time since onset of injury/illness/exacerbation    Comorbidities artthritis    Examination-Activity Limitations Carry;Lift;Other;Reach Overhead    Examination-Participation Restrictions Community Activity;Yard Work    Conservation officer, historic buildings Stable/Uncomplicated    Optometrist Low    Rehab Potential Fair    PT Frequency 2x / week    PT Duration 6 weeks    PT Treatment/Interventions Cryotherapy;Electrical Stimulation;Iontophoresis 4mg /ml Dexamethasone;Moist Heat;ADLs/Self Care Home Management;Gait training;Stair training;Functional mobility training;Therapeutic activities;Therapeutic exercise;Balance training;Patient/family education;Neuromuscular re-education;Passive range of motion;Taping;Ultrasound;Manual techniques    PT Next Visit Plan Re-assess pt's shoulder next visit and determine course of care    PT Home Exercise Plan [id number]: Scapular retractions, pendulum, AAROM flexion, ER, IR c wand, and flexion, ext, isometric exs, scapular retractions and esh. ext with Tband    Consulted and Agree with Plan of Care Patient           Patient will benefit from skilled therapeutic intervention in order to improve the following deficits and impairments:  Decreased  mobility,  Decreased strength, Postural dysfunction, Decreased range of motion, Pain, Impaired UE functional use  Visit Diagnosis: Chronic pain of both shoulders  Muscle weakness (generalized)  Chronic pain of right knee  Stiffness of right knee, not elsewhere classified  Neuralgia and neuritis     Problem List Patient Active Problem List   Diagnosis Date Noted  . Non-pressure chronic ulcer of other part of left foot limited to breakdown of skin (HCC) 01/05/2017  . Acquired claw toe, left 10/07/2016  . Hard corn 10/07/2016  . Chronic toe pain, left foot 08/25/2016  . 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 Knee pain, bilateral 06/21/2012  . Primary osteoarthritis of both knees 06/21/2012  . Generalized osteoarthritis of hand 06/21/2012    Joellyn Rued MS, PT 10/03/19 5:42 PM  [facility] [address] [place], [place], [address] Phone: [phone]   Fax:  [phone]  Name: Kristen Pham MRN: [id number] Date of Birth: 1924-09-14

## 2019-10-05 ENCOUNTER — Ambulatory Visit: Payer: Medicare Other

## 2019-10-05 ENCOUNTER — Other Ambulatory Visit: Payer: Self-pay

## 2019-10-05 DIAGNOSIS — M6281 Muscle weakness (generalized): Secondary | ICD-10-CM

## 2019-10-05 DIAGNOSIS — M25661 Stiffness of right knee, not elsewhere classified: Secondary | ICD-10-CM

## 2019-10-05 DIAGNOSIS — M25511 Pain in right shoulder: Secondary | ICD-10-CM

## 2019-10-05 DIAGNOSIS — M25561 Pain in right knee: Secondary | ICD-10-CM

## 2019-10-05 DIAGNOSIS — M792 Neuralgia and neuritis, unspecified: Secondary | ICD-10-CM

## 2019-10-06 NOTE — Therapy (Signed)
[facility] [address] [place], [place], [address] Phone: [phone]   Fax:  [phone]  Physical Therapy Treatment/Discharge Summary  Patient Details  Name: Kristen Pham MRN: [id number] Date of Birth: 04/17/1924 Referring Provider (PT): Russo, John Michael, MD   Encounter Date: 10/05/2019   PT End of Session - 10/06/19 1040    Visit Number 9    Date for PT Re-Evaluation 10/04/19    Authorization Type UHC MCR    PT Start Time 1135    PT Stop Time 1217    PT Time Calculation (min) 42 min    Activity Tolerance Patient tolerated treatment well    Behavior During Therapy WFL for tasks assessed/performed           Past Medical History:  Diagnosis Date  . Carpal tunnel syndrome   . Hypertension     Past Surgical History:  Procedure Laterality Date  . ABDOMINAL HYSTERECTOMY    . CESAREAN SECTION    . HAND SURGERY     bilateral release   . TONSILECTOMY, ADENOIDECTOMY, BILATERAL MYRINGOTOMY AND TUBES      There were no vitals filed for this visit.   Subjective Assessment - 10/06/19 1034    Subjective Pt reports her R shoulder is doing well with no or a low level of R shoulder pain the majority of the time. Overhead reaching bothers her the most.    Pain Score 2     Pain Location Shoulder    Pain Orientation Right    Pain Descriptors / Indicators Aching    Pain Type Chronic pain    Pain Onset More than a month ago    Pain Frequency Intermittent              [facility] PT Assessment - 10/06/19 0001      AROM   Right Shoulder Flexion 130 Degrees    Right Shoulder ABduction 120 Degrees                                 PT Education - 10/06/19 1039    Education Details Final HEP reviewed and provided    Person(s) Educated Patient;Child(ren)    Methods Explanation;Demonstration    Comprehension Verbalized understanding;Returned demonstration;Verbal cues required;Tactile cues required             PT Short Term Goals - 09/22/19 0640      PT SHORT TERM GOAL #1   Title Pt will be Ind in an Initial HEP. Achieved    Target Date 09/21/19      PT SHORT TERM GOAL #2   Title Pt will voice understanding of measures to assist with pain management. Achieved- use of heat or cold, pendulum exs, rest, use of L UE    Target Date 09/21/19             PT Long Term Goals - 10/06/19 1042      PT LONG TERM GOAL #1   Title Pt will be able to reach overhead with little difficulty c the R UE. Not met- Improved to some difficulty    Baseline Pt reported much difficulty c the R UE overhead    Status Not Met    Target Date 10/05/19      PT LONG TERM GOAL #2   Title Pt will be able to reach c the R arm to shoulder height with no difficulty. Achieved      Status Achieved    Target Date 10/05/19      PT LONG TERM GOAL #4   Title Ps Foto score will improve to a 38% limitation for r shoulder use. Not met-Improved to 40% limitation    Baseline 46% limitation    Status Not Met      PT LONG TERM GOAL #5   Title Pt will be Ind in a final HEP. Achieved    Status Achieved    Target Date 10/05/19                 Plan - 10/06/19 1141    Clinical Impression Statement Pt has made good progress in PT re: her R shoulder re: pain and function. On eval pt reported pain of 8/10. over course of rehab pt reported R shoulder pain of 2/10. Pt was ind c a final HEP to assist c ROM, strength, and pain. Re: overhead activities and FOTO score, pt improved, but did not met set goals. Pt is DCed with pt being pleased with her therapy result.    PT Home Exercise Plan [id number]    Consulted and Agree with Plan of Care Patient;Family member/caregiver    Family Member Consulted Daugther           Patient will benefit from skilled therapeutic intervention in order to improve the following deficits and impairments:     Visit Diagnosis: Chronic pain of both shoulders  Chronic pain of right  knee  Stiffness of right knee, not elsewhere classified  Neuralgia and neuritis  Muscle weakness (generalized)     Problem List Patient Active Problem List   Diagnosis Date Noted  . Non-pressure chronic ulcer of other part of left foot limited to breakdown of skin ([place]) 01/05/2017  . Acquired claw toe, left 10/07/2016  . Hard corn 10/07/2016  . Chronic toe pain, left foot 08/25/2016  . 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 Knee pain, bilateral 06/21/2012  . Primary osteoarthritis of both knees 06/21/2012  . Generalized osteoarthritis of hand 06/21/2012    Gar Ponto MS, PT 10/06/19 11:55 AM  PHYSICAL THERAPY DISCHARGE SUMMARY  Visits from Start of Care: 9  Current functional level related to goals / functional outcomes: See above   Remaining deficits: See above   Education / Equipment: HEP  Plan: Patient agrees to discharge.  Patient goals were not met. Patient is being discharged due to being pleased with the current functional level.  ?????        [address] [place], [place], [address] Phone: [phone]   Fax:  [phone]  Name: Kristen Pham MRN: [id number] Date of Birth: 04-05-1924

## 2019-10-11 ENCOUNTER — Encounter: Payer: Self-pay | Admitting: Orthopedic Surgery

## 2019-10-11 NOTE — Progress Notes (Signed)
Office Visit Note   Patient: Kristen Pham           Date of Birth: 10/30/1924           MRN: [id number] Visit Date: 10/03/2019              Requested by: Creola Corn, MD [address] [place],  [place] [address] PCP: Creola Corn, MD  Chief Complaint  Patient presents with  . Left Foot - Follow-up      HPI: Patient is a 86 year old woman who presents with a complaint of possible infection left second toe patient states she does have pain she has been on doxycycline for 5 days.  Patient is not diabetic she is not a smoker.  Assessment & Plan: Visit Diagnoses:  1. Claw toe, acquired, left     Plan: Plan: Patient was given felt pads to protect the toes from developing corns.  There is no sign of infection no open ulcers.  Follow-Up Instructions: Return if symptoms worsen or fail to improve.   Ortho Exam  Patient is alert, oriented, no adenopathy, well-dressed, normal affect, normal respiratory effort. Examination patient has a good dorsalis pedis pulse she has good ankle and subtalar motion she does have fixed clawing of the lesser toes there is no callus or ulcer between the toes but she is tender around the second toe.  No signs of infection at this time.  She was given felt spacers to place in the webspace to protect the toes.  Imaging: No results found. No images are attached to the encounter.  Labs: Lab Results  Component Value Date   HGBA1C 5.9 (H) 05/15/2014   ESRSEDRATE 61 (H) 12/02/2009   CRP 11.9 (H) 12/02/2009     No results found for: ALBUMIN, PREALBUMIN, LABURIC  No results found for: MG No results found for: VD25OH  No results found for: PREALBUMIN CBC EXTENDED Latest Ref Rng & Units 04/04/2016 12/02/2009 07/24/2009  WBC 4.0 - 10.5 K/uL 5.9 10.6(H) -  RBC 3.87 - 5.11 MIL/uL 4.58 4.05 -  HGB 12.0 - 15.0 g/dL 66.5 99.3 16.0(H)  HCT 36 - 46 % 41.9 36.9 47.0(H)  PLT 150 - 400 K/uL 231 306 -  NEUTROABS 1.7 - 7.7 K/uL 4.1 6.4 -  LYMPHSABS 0.7 - 4.0  K/uL 1.3 2.3 -     There is no height or weight on file to calculate BMI.  Orders:  No orders of the defined types were placed in this encounter.  No orders of the defined types were placed in this encounter.    Procedures: No procedures performed  Clinical Data: No additional findings.  ROS:  All other systems negative, except as noted in the HPI. Review of Systems  Objective: Vital Signs: There were no vitals taken for this visit.  Specialty Comments:  No specialty comments available.  PMFS History: Patient Active Problem List   Diagnosis Date Noted  . Non-pressure chronic ulcer of other part of left foot limited to breakdown of skin (HCC) 01/05/2017  . Acquired claw toe, left 10/07/2016  . Hard corn 10/07/2016  . Chronic toe pain, left foot 08/25/2016  . 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 Knee pain, bilateral 06/21/2012  . Primary osteoarthritis of both knees 06/21/2012  . Generalized osteoarthritis of hand 06/21/2012   Past Medical History:  Diagnosis Date  . Carpal tunnel syndrome   . Hypertension     Family History  Problem Relation Age of Onset  . Cerebral aneurysm Father  Past Surgical History:  Procedure Laterality Date  . ABDOMINAL HYSTERECTOMY    . CESAREAN SECTION    . HAND SURGERY     bilateral release   . TONSILECTOMY, ADENOIDECTOMY, BILATERAL MYRINGOTOMY AND TUBES     Social History   Occupational History  . Occupation: retired   Tobacco Use  . Smoking status: Never Smoker  . Smokeless tobacco: Never Used  Vaping Use  . Vaping Use: Never used  Substance and Sexual Activity  . Alcohol use: Yes    Alcohol/week: 0.0 standard drinks    Comment: occasionally   . Drug use: No  . Sexual activity: Not on file

## 2019-11-27 ENCOUNTER — Ambulatory Visit: Payer: Medicare Other | Admitting: Physician Assistant

## 2019-11-27 ENCOUNTER — Encounter: Payer: Self-pay | Admitting: Orthopedic Surgery

## 2019-11-27 ENCOUNTER — Ambulatory Visit: Payer: Medicare Other | Admitting: Orthopedic Surgery

## 2019-11-27 VITALS — Ht 61.0 in | Wt 110.0 lb

## 2019-11-27 DIAGNOSIS — M205X2 Other deformities of toe(s) (acquired), left foot: Secondary | ICD-10-CM | POA: Diagnosis not present

## 2019-11-27 NOTE — Progress Notes (Signed)
Office Visit Note   Patient: Kristen Pham           Date of Birth: 1925-01-05           MRN: [id number] Visit Date: 11/27/2019              Requested by: Creola Corn, MD [address] [place],  [place] [address] PCP: Creola Corn, MD  Chief Complaint  Patient presents with  . bilateral feet    requests rx for orthopedic shoes      HPI: Patient presents today she is a pleasant healthy 86 year old woman who comes in today requesting a prescription for new shoes and orthotics.  She has neuropathy and fixed claw toe deformities.  She was last seen with a ulcer on her toe on the left side her second toe this is since gotten much better.  She does have some discoloration of the toe which she said she has had for a while that was secondary to an infection treated elsewhere  Assessment & Plan: Visit Diagnoses: No diagnosis found.  Plan: Patient was given a prescription will follow up in 3 months for nail trim  Follow-Up Instructions: No follow-ups on file.   Ortho Exam  Patient is alert, oriented, no adenopathy, well-dressed, normal affect, normal respiratory effort. Bilateral feet she has palpable dorsalis pedis pulses no swelling no cellulitis no ulcers she does have fixed claw toe deformities.  No signs of infection  Imaging: No results found. No images are attached to the encounter.  Labs: Lab Results  Component Value Date   HGBA1C 5.9 (H) 05/15/2014   ESRSEDRATE 61 (H) 12/02/2009   CRP 11.9 (H) 12/02/2009     No results found for: ALBUMIN, PREALBUMIN, LABURIC  No results found for: MG No results found for: VD25OH  No results found for: PREALBUMIN CBC EXTENDED Latest Ref Rng & Units 04/04/2016 12/02/2009 07/24/2009  WBC 4.0 - 10.5 K/uL 5.9 10.6(H) -  RBC 3.87 - 5.11 MIL/uL 4.58 4.05 -  HGB 12.0 - 15.0 g/dL 56.3 87.5 16.0(H)  HCT 36 - 46 % 41.9 36.9 47.0(H)  PLT 150 - 400 K/uL 231 306 -  NEUTROABS 1.7 - 7.7 K/uL 4.1 6.4 -  LYMPHSABS 0.7 - 4.0 K/uL 1.3 2.3 -      Body mass index is 20.78 kg/m.  Orders:  No orders of the defined types were placed in this encounter.  No orders of the defined types were placed in this encounter.    Procedures: No procedures performed  Clinical Data: No additional findings.  ROS:  All other systems negative, except as noted in the HPI. Review of Systems  Objective: Vital Signs: Ht 5\' 1"  (1.549 m)   Wt 110 lb (49.9 kg)   BMI 20.78 kg/m   Specialty Comments:  No specialty comments available.  PMFS History: Patient Active Problem List   Diagnosis Date Noted  . Non-pressure chronic ulcer of other part of left foot limited to breakdown of skin (HCC) 01/05/2017  . Acquired claw toe, left 10/07/2016  . Hard corn 10/07/2016  . Chronic toe pain, left foot 08/25/2016  . 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 Knee pain, bilateral 06/21/2012  . Primary osteoarthritis of both knees 06/21/2012  . Generalized osteoarthritis of hand 06/21/2012   Past Medical History:  Diagnosis Date  . Carpal tunnel syndrome   . Hypertension     Family History  Problem Relation Age of Onset  . Cerebral aneurysm Father     Past Surgical History:  Procedure Laterality Date  . ABDOMINAL HYSTERECTOMY    . CESAREAN SECTION    . HAND SURGERY     bilateral release   . TONSILECTOMY, ADENOIDECTOMY, BILATERAL MYRINGOTOMY AND TUBES     Social History   Occupational History  . Occupation: retired   Tobacco Use  . Smoking status: Never Smoker  . Smokeless tobacco: Never Used  Vaping Use  . Vaping Use: Never used  Substance and Sexual Activity  . Alcohol use: Yes    Alcohol/week: 0.0 standard drinks    Comment: occasionally   . Drug use: No  . Sexual activity: Not on file

## 2019-12-18 ENCOUNTER — Telehealth: Payer: Self-pay

## 2019-12-18 NOTE — Telephone Encounter (Signed)
Patient called she stated she misplaced her rx for orthotic shoes she is requesting another rx she will come pick it up. She needs rx by December 7. CB:[phone]

## 2019-12-19 NOTE — Telephone Encounter (Signed)
Can you please write for pt? Thanks!

## 2019-12-20 NOTE — Telephone Encounter (Signed)
Order faxed.

## 2019-12-20 NOTE — Telephone Encounter (Signed)
Written, does she want [place] to just fax it to [facility]?

## 2020-03-19 ENCOUNTER — Other Ambulatory Visit: Payer: Self-pay

## 2020-03-19 ENCOUNTER — Encounter: Payer: Self-pay | Admitting: Cardiovascular Disease

## 2020-03-19 ENCOUNTER — Ambulatory Visit: Payer: Medicare Other | Admitting: Cardiovascular Disease

## 2020-03-19 VITALS — BP 170/70 | HR 78 | Ht 61.0 in | Wt 108.8 lb

## 2020-03-19 DIAGNOSIS — R011 Cardiac murmur, unspecified: Secondary | ICD-10-CM | POA: Diagnosis not present

## 2020-03-19 DIAGNOSIS — R079 Chest pain, unspecified: Secondary | ICD-10-CM | POA: Diagnosis not present

## 2020-03-19 DIAGNOSIS — I10 Essential (primary) hypertension: Secondary | ICD-10-CM | POA: Diagnosis not present

## 2020-03-19 DIAGNOSIS — E785 Hyperlipidemia, unspecified: Secondary | ICD-10-CM

## 2020-03-19 DIAGNOSIS — R0602 Shortness of breath: Secondary | ICD-10-CM

## 2020-03-19 NOTE — Progress Notes (Signed)
Cardiology Office Note   Date:  03/19/2020   ID:  Kristen Pham, DOB 07/03/1924, MRN [id number]  PCP:  Creola Corn, MD  Cardiologist:   Lorine Bears, MD   No chief complaint on file.     History of Present Illness: Kristen Pham is a 87 y.o. female who is here for evaluation of shortness of breath and occasional chest pain.  She has been relatively healthy throughout her life looks younger than her stated age.  She has known history of essential hypertension, hyperlipidemia and arthritis.  She is not a smoker and does not have diabetes. She was seen by me in 2017 for leg pain with concerns for peripheral arterial disease. She underwent noninvasive vascular studies in 2017 which showed noncompressible vessels with moderate SFA disease bilaterally.  There was no evidence of obstructive disease at that time. She has not been seen by me since then and now returns with complaints of exertional dyspnea which she noticed over the last 6 months.  She gets tired easier than before.  Occasionally she has pressure sensation in the chest especially at night.  No palpitations.  No orthopnea, PND or leg edema.  No significant change in her weight.  No recent cardiac evaluation.  She has no history of lung disease and she is not a smoker. She denies leg claudication.  Past Medical History:  Diagnosis Date  . Carpal tunnel syndrome   . Hypertension     Past Surgical History:  Procedure Laterality Date  . ABDOMINAL HYSTERECTOMY    . CESAREAN SECTION    . HAND SURGERY     bilateral release   . TONSILECTOMY, ADENOIDECTOMY, BILATERAL MYRINGOTOMY AND TUBES       Current Outpatient Medications  Medication Sig Dispense Refill  . acetaminophen (TYLENOL) 325 MG tablet Take 650 mg by mouth every 6 (six) hours as needed.    Marland Kitchen amLODipine (NORVASC) 10 MG tablet Take 10 mg by mouth daily.    Marland Kitchen atenolol (TENORMIN) 50 MG tablet Take 50 mg by mouth daily.    . calcium-vitamin D (OSCAL WITH D)  500-200 MG-UNIT per tablet Take 2 tablets by mouth.    Marland Kitchen ibuprofen (ADVIL) 200 MG tablet Take 200 mg by mouth every 6 (six) hours as needed.    . Misc Natural Products (T-RELIEF CBD+13 SL) Place under the tongue.    . RESTASIS 0.05 % ophthalmic emulsion      Current Facility-Administered Medications  Medication Dose Route Frequency Provider Last Rate Last Admin  . triamcinolone acetonide (KENALOG) 10 MG/ML injection 10 mg  10 mg Other Once Asencion Islam, DPM        Allergies:   Ivp dye [iodinated diagnostic agents]    Social History:  The patient  reports that she has never smoked. She has never used smokeless tobacco. She reports current alcohol use. She reports that she does not use drugs.   Family History:  The patient's family history includes Cerebral aneurysm in her father.    ROS:  Please see the history of present illness.   Otherwise, review of systems are positive for none.   All other systems are reviewed and negative.    PHYSICAL EXAM: VS:  BP (!) 170/70   Pulse 78   Ht 5\' 1"  (1.549 m)   Wt 108 lb 12.8 oz (49.4 kg)   BMI 20.56 kg/m  , BMI Body mass index is 20.56 kg/m. GEN: Well nourished, well developed, in no acute distress  HEENT:  normal  Neck: no JVD, carotid bruits, or masses Cardiac: RRR; no rubs, or gallops,no edema .2/ 6 systolic ejection murmur in the aortic area Respiratory:  clear to auscultation bilaterally, normal work of breathing GI: soft, nontender, nondistended, + BS. No abdominal bruit MS: no deformity or atrophy  Skin: warm and dry, no rash Neuro:  Strength and sensation are intact Psych: euthymic mood, full affect   EKG:  EKG is ordered today. The ekg ordered today demonstrates normal sinus rhythm with no significant ST or T wave changes.   Recent Labs: No results found for requested labs within last 8760 hours.    Lipid Panel No results found for: CHOL, TRIG, HDL, CHOLHDL, VLDL, LDLCALC, LDLDIRECT    Wt Readings from Last 3  Encounters:  03/19/20 108 lb 12.8 oz (49.4 kg)  11/27/19 110 lb (49.9 kg)  09/05/18 110 lb (49.9 kg)       No flowsheet data found.    ASSESSMENT AND PLAN:  1.  Exertional dyspnea and occasional chest pain: We have to exclude underlying cardiac etiology.  EKG does not show any ischemic changes but she does have multiple risk factors for coronary artery disease.  I requested a Lexiscan Myoview.  2.  Cardiac murmur: Suggestive of aortic sclerosis or mild stenosis.  Given her symptoms, I requested an echocardiogram.  3.  Essential hypertension: Blood pressure is elevated but she reports that her blood pressure at home is around 146/78.  Continue to monitor for now.  4.  Hyperlipidemia: Currently not on medications.    Disposition:   FU with me in 1 month after cardiac testing.  Signed,  Lorine Bears, MD  03/19/2020 1:32 PM    [facility]

## 2020-03-19 NOTE — Patient Instructions (Signed)
Medication Instructions:  No changes *If you need a refill on your cardiac medications before your next appointment, please call your pharmacy*   Lab Work: None ordered If you have labs (blood work) drawn today and your tests are completely normal, you will receive your results only by: Marland Kitchen MyChart Message (if you have MyChart) OR . A paper copy in the mail If you have any lab test that is abnormal or we need to change your treatment, we will call you to review the results.   Testing/Procedures: Your physician has requested that you have an echocardiogram. Echocardiography is a painless test that uses sound waves to create images of your heart. It provides your doctor with information about the size and shape of your heart and how well your heart's chambers and valves are working. You may receive an ultrasound enhancing agent through an IV if needed to better visualize your heart during the echo.This procedure takes approximately one hour. There are no restrictions for this procedure. This will take place at the [address]   Your physician has requested that you have a lexiscan myoview. For further information please visit [email or web address] Please follow instruction sheet, as given. This will take place at [address] [facility], [address]  How to prepare for your Myocardial Perfusion Test:  Do not eat or drink 3 hours prior to your test, except you may have water.  Do not consume products containing caffeine (regular or decaffeinated) 12 hours prior to your test. (ex: coffee, chocolate, sodas, tea).  Do bring a list of your current medications with you.  If not listed below, you may take your medications as normal.  Do wear comfortable clothes (no dresses or overalls) and walking shoes, tennis shoes preferred (No heels or open toe shoes are allowed).  Do NOT wear cologne, perfume, aftershave, or lotions (deodorant is allowed).  The test will take approximately 3 to 4  hours to complete  If these instructions are not followed, your test will have to be rescheduled.   Follow-Up: At [facility], you and your health needs are our priority.  As part of our continuing mission to provide you with exceptional heart care, we have created designated Provider Care Teams.  These Care Teams include your primary Cardiologist (physician) and Advanced Practice Providers (APPs -  Physician Assistants and Nurse Practitioners) who all work together to provide you with the care you need, when you need it.  We recommend signing up for the patient portal called "MyChart".  Sign up information is provided on this After Visit Summary.  MyChart is used to connect with patients for Virtual Visits (Telemedicine).  Patients are able to view lab/test results, encounter notes, upcoming appointments, etc.  Non-urgent messages can be sent to your provider as well.   To learn more about what you can do with MyChart, go to [email or web address].    Your next appointment:   1 month(s)  The format for your next appointment:   In Person  Provider:   Lorine Bears, MD

## 2020-04-11 ENCOUNTER — Telehealth (HOSPITAL_COMMUNITY): Payer: Self-pay

## 2020-04-11 NOTE — Telephone Encounter (Signed)
Patient'S CAREGIVER given/VERIFIED detailed instructions per Myocardial Perfusion Study Information Sheet for the test on 3/28 at 1000. Patient notified to arrive 15 minutes early and that it is imperative to arrive on time for appointment to keep from having the test rescheduled.  If you need to cancel or reschedule your appointment, please call the office within 24 hours of your appointment. . Patient verbalized understanding.TMY

## 2020-04-15 ENCOUNTER — Ambulatory Visit (HOSPITAL_COMMUNITY): Payer: Medicare Other

## 2020-04-15 ENCOUNTER — Ambulatory Visit (HOSPITAL_COMMUNITY): Payer: Medicare Other | Attending: Cardiovascular Disease

## 2020-04-15 ENCOUNTER — Other Ambulatory Visit: Payer: Self-pay

## 2020-04-15 DIAGNOSIS — R0602 Shortness of breath: Secondary | ICD-10-CM | POA: Insufficient documentation

## 2020-04-15 DIAGNOSIS — R079 Chest pain, unspecified: Secondary | ICD-10-CM | POA: Diagnosis present

## 2020-04-15 LAB — MYOCARDIAL PERFUSION IMAGING
LV dias vol: 63 mL (ref 46–106)
LV sys vol: 23 mL
Peak HR: 78 {beats}/min
Rest HR: 62 {beats}/min
SDS: 1
SRS: 0
SSS: 1
TID: 1.05

## 2020-04-15 MED ORDER — TECHNETIUM TC 99M TETROFOSMIN IV KIT
30.8000 | PACK | Freq: Once | INTRAVENOUS | Status: AC | PRN
  Administered 2020-04-15: 30.8 via INTRAVENOUS
  Filled 2020-04-15: qty 31

## 2020-04-15 MED ORDER — TECHNETIUM TC 99M TETROFOSMIN IV KIT
10.8000 | PACK | Freq: Once | INTRAVENOUS | Status: AC | PRN
  Administered 2020-04-15: 10.8 via INTRAVENOUS
  Filled 2020-04-15: qty 11

## 2020-04-15 MED ORDER — REGADENOSON 0.4 MG/5ML IV SOLN
0.4000 mg | Freq: Once | INTRAVENOUS | Status: AC
  Administered 2020-04-15: 0.4 mg via INTRAVENOUS

## 2020-04-16 ENCOUNTER — Ambulatory Visit (HOSPITAL_COMMUNITY): Payer: Medicare Other | Attending: Internal Medicine

## 2020-04-16 DIAGNOSIS — R011 Cardiac murmur, unspecified: Secondary | ICD-10-CM | POA: Diagnosis not present

## 2020-04-16 LAB — ECHOCARDIOGRAM COMPLETE
AR max vel: 0.98 cm2
AV Area VTI: 0.94 cm2
AV Area mean vel: 0.91 cm2
AV Mean grad: 11 mmHg
AV Peak grad: 17.8 mmHg
Ao pk vel: 2.11 m/s
Area-P 1/2: 3.85 cm2
S' Lateral: 2.4 cm

## 2020-04-23 ENCOUNTER — Ambulatory Visit (INDEPENDENT_AMBULATORY_CARE_PROVIDER_SITE_OTHER): Payer: Medicare Other | Admitting: Cardiovascular Disease

## 2020-04-23 ENCOUNTER — Other Ambulatory Visit: Payer: Self-pay

## 2020-04-23 ENCOUNTER — Encounter: Payer: Self-pay | Admitting: Cardiovascular Disease

## 2020-04-23 VITALS — BP 122/70 | HR 79 | Ht 61.0 in | Wt 107.8 lb

## 2020-04-23 DIAGNOSIS — R0602 Shortness of breath: Secondary | ICD-10-CM | POA: Diagnosis not present

## 2020-04-23 DIAGNOSIS — I10 Essential (primary) hypertension: Secondary | ICD-10-CM

## 2020-04-23 DIAGNOSIS — E785 Hyperlipidemia, unspecified: Secondary | ICD-10-CM

## 2020-04-23 DIAGNOSIS — I350 Nonrheumatic aortic (valve) stenosis: Secondary | ICD-10-CM | POA: Diagnosis not present

## 2020-04-23 NOTE — Patient Instructions (Signed)

## 2020-04-23 NOTE — Progress Notes (Signed)
Cardiology Office Note   Date:  04/23/2020   ID:  Cathey Fredenburg, DOB 05-Oct-1924, MRN [id number]  PCP:  Creola Corn, MD  Cardiologist:   Lorine Bears, MD   No chief complaint on file.     History of Present Illness: Kristen Pham is a 87 y.o. female who is here for a follow-up visit regarding exertional dyspnea.   She has been relatively healthy throughout her life looks younger than her stated age.  She has known history of essential hypertension, hyperlipidemia and arthritis.  She is not a smoker and does not have diabetes. She was seen by me in 2017 for leg pain with concerns for peripheral arterial disease. She underwent noninvasive vascular studies in 2017 which showed noncompressible vessels with moderate SFA disease bilaterally.  There was no evidence of obstructive disease at that time. She was seen recently for worsening exertional dyspnea with occasional chest discomfort.  She was noted to have an aortic stenosis murmur. She had an echocardiogram done which showed normal LV systolic function with mild aortic stenosis with mean gradient of 11 mmHg.  Lexiscan Myoview showed no evidence of ischemia with normal ejection fraction. She reports some improvement in her symptoms.  No recent chest pain.   Past Medical History:  Diagnosis Date  . Carpal tunnel syndrome   . Hypertension     Past Surgical History:  Procedure Laterality Date  . ABDOMINAL HYSTERECTOMY    . CESAREAN SECTION    . HAND SURGERY     bilateral release   . TONSILECTOMY, ADENOIDECTOMY, BILATERAL MYRINGOTOMY AND TUBES       Current Outpatient Medications  Medication Sig Dispense Refill  . acetaminophen (TYLENOL) 325 MG tablet Take 650 mg by mouth every 6 (six) hours as needed.    Marland Kitchen amLODipine (NORVASC) 10 MG tablet Take 10 mg by mouth daily.    Marland Kitchen atenolol (TENORMIN) 50 MG tablet Take 50 mg by mouth daily.    . calcium-vitamin D (OSCAL WITH D) 500-200 MG-UNIT per tablet Take 2 tablets by mouth.     Marland Kitchen ibuprofen (ADVIL) 200 MG tablet Take 200 mg by mouth every 6 (six) hours as needed.    . mirabegron ER (MYRBETRIQ) 25 MG TB24 tablet Take 25 mg by mouth daily.    . Misc Natural Products (T-RELIEF CBD+13 SL) Place under the tongue.    . RESTASIS 0.05 % ophthalmic emulsion      Current Facility-Administered Medications  Medication Dose Route Frequency Provider Last Rate Last Admin  . triamcinolone acetonide (KENALOG) 10 MG/ML injection 10 mg  10 mg Other Once Asencion Islam, DPM        Allergies:   Ivp dye [iodinated diagnostic agents]    Social History:  The patient  reports that she has never smoked. She has never used smokeless tobacco. She reports current alcohol use. She reports that she does not use drugs.   Family History:  The patient's family history includes Cerebral aneurysm in her father.    ROS:  Please see the history of present illness.   Otherwise, review of systems are positive for none.   All other systems are reviewed and negative.    PHYSICAL EXAM: VS:  BP 122/70   Pulse 79   Ht 5\' 1"  (1.549 m)   Wt 107 lb 12.8 oz (48.9 kg)   SpO2 97%   BMI 20.37 kg/m  , BMI Body mass index is 20.37 kg/m. GEN: Well nourished, well developed, in no acute distress  HEENT: normal  Neck: no JVD, carotid bruits, or masses Cardiac: RRR; no rubs, or gallops,no edema .2/ 6 systolic ejection murmur in the aortic area Respiratory:  clear to auscultation bilaterally, normal work of breathing GI: soft, nontender, nondistended, + BS. No abdominal bruit MS: no deformity or atrophy  Skin: warm and dry, no rash Neuro:  Strength and sensation are intact Psych: euthymic mood, full affect   EKG:  EKG is not ordered today.    Recent Labs: No results found for requested labs within last 8760 hours.    Lipid Panel No results found for: CHOL, TRIG, HDL, CHOLHDL, VLDL, LDLCALC, LDLDIRECT    Wt Readings from Last 3 Encounters:  04/23/20 107 lb 12.8 oz (48.9 kg)  04/15/20 108  lb (49 kg)  03/19/20 108 lb 12.8 oz (49.4 kg)       No flowsheet data found.    ASSESSMENT AND PLAN:  1.  Exertional dyspnea and occasional chest pain: Lexiscan Myoview showed no evidence of ischemia.  Echocardiogram was overall reassuring with only mild aortic stenosis.  There was evidence of diastolic dysfunction but she appears to be euvolemic by exam.  2.  Mild aortic stenosis: Not severe enough to be causing symptoms.  Continue to monitor.  3.  Essential hypertension: Blood pressure today checked by me was 156/68.  Her blood pressure at home is more controlled.  Continue atenolol and amlodipine.  4.  Hyperlipidemia: Currently not on medications.    Disposition:   FU with me in 6 months.  Signed,  Lorine Bears, MD  04/23/2020 11:04 AM    [facility]

## 2020-05-14 ENCOUNTER — Other Ambulatory Visit (HOSPITAL_COMMUNITY): Payer: Medicare Other

## 2020-05-17 ENCOUNTER — Other Ambulatory Visit (HOSPITAL_COMMUNITY): Payer: Medicare Other

## 2021-02-03 ENCOUNTER — Telehealth: Payer: Self-pay | Admitting: Cardiovascular Disease

## 2021-02-03 NOTE — Telephone Encounter (Signed)
Called patient back. She states she has been having some increased shortness of breath. She does not have it all the time, she does not mention any swelling or weight gain when asked. Patient states she just wanted Dr.Arida to be aware. I did advise for her to keep the appointment scheduled for tomorrow 01/17 with Dr.Arida so he can assess the causes of this. Patient did verbalize understanding, will keep upcoming appointment. Will route to MD as Lorain Childes for tomorrow.  Thanks!

## 2021-02-03 NOTE — Telephone Encounter (Signed)
Pt c/o Shortness Of Breath: STAT if SOB developed within the last 24 hours or pt is noticeably SOB on the phone  1. Are you currently SOB (can you hear that pt is SOB on the phone)?  No   2. How long have you been experiencing SOB?  Past few months  3. Are you SOB when sitting or when up moving around?  When up and moving around   4. Are you currently experiencing any other symptoms?  No   Patient has an appointment scheduled for 01/17 with Dr. Fletcher Anon.

## 2021-02-04 ENCOUNTER — Encounter: Payer: Self-pay | Admitting: Cardiovascular Disease

## 2021-02-04 ENCOUNTER — Ambulatory Visit: Payer: Medicare Other | Admitting: Cardiovascular Disease

## 2021-02-04 ENCOUNTER — Other Ambulatory Visit: Payer: Self-pay

## 2021-02-04 VITALS — BP 144/70 | HR 72 | Ht 61.0 in | Wt 105.0 lb

## 2021-02-04 DIAGNOSIS — I359 Nonrheumatic aortic valve disorder, unspecified: Secondary | ICD-10-CM

## 2021-02-04 DIAGNOSIS — I10 Essential (primary) hypertension: Secondary | ICD-10-CM

## 2021-02-04 DIAGNOSIS — E785 Hyperlipidemia, unspecified: Secondary | ICD-10-CM

## 2021-02-04 DIAGNOSIS — I739 Peripheral vascular disease, unspecified: Secondary | ICD-10-CM | POA: Diagnosis not present

## 2021-02-04 NOTE — Progress Notes (Signed)
Cardiology Office Note   Date:  02/04/2021   ID:  Kristen Pham, DOB 06-12-1924, MRN [id number]  PCP:  Creola Corn, MD  Cardiologist:   Lorine Bears, MD   No chief complaint on file.     History of Present Illness: Kristen Pham is a 88 y.o. female who is here for a follow-up visit regarding exertional dyspnea.   She has been relatively healthy throughout her life looks younger than her stated age.  She has known history of essential hypertension, hyperlipidemia and arthritis.  She is not a smoker and does not have diabetes. She was seen by me in 2017 for leg pain with concerns for peripheral arterial disease. She underwent noninvasive vascular studies in 2017 which showed noncompressible vessels with moderate SFA disease bilaterally.  There was no evidence of obstructive disease at that time. She underwent cardiac evaluation in 2022 due to worsening exertional dyspnea.  Echocardiogram was done in March 2022 and showed  normal LV systolic function with mild aortic stenosis with mean gradient of 11 mmHg.  Lexiscan Myoview showed no evidence of ischemia with normal ejection fraction. She reports intermittent shortness of breath and decreased functional capacity overall over the last year.  No chest pain.  She is limited by knee arthritis but no significant claudication.   Past Medical History:  Diagnosis Date   Carpal tunnel syndrome    Hypertension     Past Surgical History:  Procedure Laterality Date   ABDOMINAL HYSTERECTOMY     CESAREAN SECTION     HAND SURGERY     bilateral release    TONSILECTOMY, ADENOIDECTOMY, BILATERAL MYRINGOTOMY AND TUBES       Current Outpatient Medications  Medication Sig Dispense Refill   acetaminophen (TYLENOL) 325 MG tablet Take 650 mg by mouth every 6 (six) hours as needed.     amLODipine (NORVASC) 10 MG tablet Take 10 mg by mouth daily.     atenolol (TENORMIN) 50 MG tablet Take 50 mg by mouth daily.     calcium-vitamin D (OSCAL  WITH D) 500-200 MG-UNIT per tablet Take 2 tablets by mouth.     ibuprofen (ADVIL) 200 MG tablet Take 200 mg by mouth every 6 (six) hours as needed.     mirabegron ER (MYRBETRIQ) 25 MG TB24 tablet Take 25 mg by mouth daily.     Misc Natural Products (T-RELIEF CBD+13 SL) Place under the tongue.     RESTASIS 0.05 % ophthalmic emulsion      Current Facility-Administered Medications  Medication Dose Route Frequency Provider Last Rate Last Admin   triamcinolone acetonide (KENALOG) 10 MG/ML injection 10 mg  10 mg Other Once Asencion Islam, DPM        Allergies:   Ivp dye [iodinated contrast media]    Social History:  The patient  reports that she has never smoked. She has never used smokeless tobacco. She reports current alcohol use. She reports that she does not use drugs.   Family History:  The patient's family history includes Cerebral aneurysm in her father.    ROS:  Please see the history of present illness.   Otherwise, review of systems are positive for none.   All other systems are reviewed and negative.    PHYSICAL EXAM: VS:  BP (!) 144/70    Pulse 72    Ht 5\' 1"  (1.549 m)    Wt 105 lb (47.6 kg)    SpO2 99%    BMI 19.84 kg/m  , BMI Body  mass index is 19.84 kg/m. GEN: Well nourished, well developed, in no acute distress  HEENT: normal  Neck: no JVD, carotid bruits, or masses Cardiac: RRR; no rubs, or gallops,no edema .2/ 6 systolic ejection murmur in the aortic area which is mid peaking. Respiratory:  clear to auscultation bilaterally, normal work of breathing GI: soft, nontender, nondistended, + BS. No abdominal bruit MS: no deformity or atrophy  Skin: warm and dry, no rash Neuro:  Strength and sensation are intact Psych: euthymic mood, full affect   EKG:  EKG is ordered today. EKG showed normal sinus rhythm with no significant ST or T wave changes.   Recent Labs: No results found for requested labs within last 8760 hours.    Lipid Panel No results found for: CHOL,  TRIG, HDL, CHOLHDL, VLDL, LDLCALC, LDLDIRECT    Wt Readings from Last 3 Encounters:  02/04/21 105 lb (47.6 kg)  04/23/20 107 lb 12.8 oz (48.9 kg)  04/15/20 108 lb (49 kg)       No flowsheet data found.    ASSESSMENT AND PLAN:  1.  Exertional dyspnea : Gradual decline in functional capacity over the last year is likely multifactorial.  Continue close observation.  2.  Aortic stenosis: This was mild by echocardiogram last year.  Aortic stenosis does not appear to be severe by exam today.  Even if aortic stenosis progresses in the future, she is hesitant to be treated with invasive procedure.  3.  Essential hypertension: Blood pressure is reasonably controlled on atenolol and amlodipine.  4.  Hyperlipidemia: Currently not on medications.  5.  Peripheral arterial disease: She does have abnormal distal pulses with evidence of moderate SFA disease on previous evaluation 2017.  She is mostly limited by knee arthritis and left foot claudication.  Continue medical therapy.    Disposition:   FU with me in 6 months.  Signed,  Kathlyn Sacramento, MD  02/04/2021 10:18 AM    [place]

## 2021-02-04 NOTE — Patient Instructions (Signed)
Medication Instructions:  °The current medical regimen is effective;  continue present plan and medications. ° °*If you need a refill on your cardiac medications before your next appointment, please call your pharmacy* ° ° °Follow-Up: °At [facility], you and your health needs are our priority.  As part of our continuing mission to provide you with exceptional heart care, we have created designated Provider Care Teams.  These Care Teams include your primary Cardiologist (physician) and Advanced Practice Providers (APPs -  Physician Assistants and Nurse Practitioners) who all work together to provide you with the care you need, when you need it. ° °We recommend signing up for the patient portal called "MyChart".  Sign up information is provided on this After Visit Summary.  MyChart is used to connect with patients for Virtual Visits (Telemedicine).  Patients are able to view lab/test results, encounter notes, upcoming appointments, etc.  Non-urgent messages can be sent to your provider as well.   °To learn more about what you can do with MyChart, go to [email or web address].   ° °Your next appointment:   °6 month(s) ° °The format for your next appointment:   °In Person ° °Provider:   °Dr.Arida  ° ° °

## 2021-02-17 DIAGNOSIS — H1820 Unspecified corneal edema: Secondary | ICD-10-CM | POA: Insufficient documentation

## 2021-02-17 DIAGNOSIS — Z961 Presence of intraocular lens: Secondary | ICD-10-CM | POA: Insufficient documentation

## 2021-08-05 ENCOUNTER — Ambulatory Visit: Payer: Medicare Other | Admitting: Cardiovascular Disease

## 2021-08-05 ENCOUNTER — Encounter: Payer: Self-pay | Admitting: Cardiovascular Disease

## 2021-08-05 VITALS — BP 145/68 | HR 68 | Ht 59.0 in | Wt 104.6 lb

## 2021-08-05 DIAGNOSIS — I10 Essential (primary) hypertension: Secondary | ICD-10-CM

## 2021-08-05 DIAGNOSIS — E785 Hyperlipidemia, unspecified: Secondary | ICD-10-CM | POA: Diagnosis not present

## 2021-08-05 DIAGNOSIS — I739 Peripheral vascular disease, unspecified: Secondary | ICD-10-CM | POA: Diagnosis not present

## 2021-08-05 DIAGNOSIS — I359 Nonrheumatic aortic valve disorder, unspecified: Secondary | ICD-10-CM

## 2021-08-05 NOTE — Progress Notes (Signed)
Cardiology Office Note   Date:  08/05/2021   ID:  Kristen Pham, DOB 02-22-1924, MRN [id number]  PCP:  Creola Corn, MD  Cardiologist:   Lorine Bears, MD   No chief complaint on file.     History of Present Illness: Kristen Pham is a 88 y.o. female who is here for a follow-up visit regarding exertional dyspnea.   She has been relatively healthy throughout her life looks younger than her stated age.  She has known history of essential hypertension, hyperlipidemia and arthritis.  She is not a smoker and does not have diabetes. She was seen by me in 2017 for leg pain with concerns for peripheral arterial disease. She underwent noninvasive vascular studies in 2017 which showed noncompressible vessels with moderate SFA disease bilaterally.  There was no evidence of obstructive disease at that time. She underwent cardiac evaluation in 2022 due to worsening exertional dyspnea.  Echocardiogram was done in March 2022 and showed  normal LV systolic function with mild aortic stenosis with mean gradient of 11 mmHg.  Lexiscan Myoview showed no evidence of ischemia with normal ejection fraction.  She has been doing reasonably well overall with no recent chest pain.  She reports improvement in shortness of breath.  She complains of numbness in both toes but now significant pain in the calf or thighs.   Past Medical History:  Diagnosis Date   Carpal tunnel syndrome    Hypertension     Past Surgical History:  Procedure Laterality Date   ABDOMINAL HYSTERECTOMY     CESAREAN SECTION     HAND SURGERY     bilateral release    TONSILECTOMY, ADENOIDECTOMY, BILATERAL MYRINGOTOMY AND TUBES       Current Outpatient Medications  Medication Sig Dispense Refill   acetaminophen (TYLENOL) 325 MG tablet Take 650 mg by mouth every 6 (six) hours as needed.     amLODipine (NORVASC) 10 MG tablet Take 10 mg by mouth daily.     atenolol (TENORMIN) 50 MG tablet Take 50 mg by mouth daily.      calcium-vitamin D (OSCAL WITH D) 500-200 MG-UNIT per tablet Take 2 tablets by mouth.     ibuprofen (ADVIL) 200 MG tablet Take 200 mg by mouth every 6 (six) hours as needed.     mirabegron ER (MYRBETRIQ) 25 MG TB24 tablet Take 25 mg by mouth daily.     Misc Natural Products (T-RELIEF CBD+13 SL) Place under the tongue.     RESTASIS 0.05 % ophthalmic emulsion      Current Facility-Administered Medications  Medication Dose Route Frequency Provider Last Rate Last Admin   triamcinolone acetonide (KENALOG) 10 MG/ML injection 10 mg  10 mg Other Once Asencion Islam, DPM        Allergies:   Ivp dye [iodinated contrast media]    Social History:  The patient  reports that she has never smoked. She has never used smokeless tobacco. She reports current alcohol use. She reports that she does not use drugs.   Family History:  The patient's family history includes Cerebral aneurysm in her father.    ROS:  Please see the history of present illness.   Otherwise, review of systems are positive for none.   All other systems are reviewed and negative.    PHYSICAL EXAM: VS:  BP (!) 145/68   Pulse 68   Ht 4\' 11"  (1.499 m)   Wt 104 lb 9.6 oz (47.4 kg)   SpO2 99%   BMI 21.13  kg/m  , BMI Body mass index is 21.13 kg/m. GEN: Well nourished, well developed, in no acute distress  HEENT: normal  Neck: no JVD, carotid bruits, or masses Cardiac: RRR; no rubs, or gallops,no edema .2/ 6 systolic ejection murmur in the aortic area which is mid peaking. Respiratory:  clear to auscultation bilaterally, normal work of breathing GI: soft, nontender, nondistended, + BS. No abdominal bruit MS: no deformity or atrophy  Skin: warm and dry, no rash Neuro:  Strength and sensation are intact Psych: euthymic mood, full affect   EKG:  EKG is ordered today. EKG showed normal sinus rhythm with no significant ST or T wave changes.   Recent Labs: No results found for requested labs within last 365 days.    Lipid  Panel No results found for: "CHOL", "TRIG", "HDL", "CHOLHDL", "VLDL", "LDLCALC", "LDLDIRECT"    Wt Readings from Last 3 Encounters:  08/05/21 104 lb 9.6 oz (47.4 kg)  02/04/21 105 lb (47.6 kg)  04/23/20 107 lb 12.8 oz (48.9 kg)           No data to display            ASSESSMENT AND PLAN:  1.  Exertional dyspnea : She reports improvement in symptoms overall.  2.  Aortic stenosis: This was mild by echocardiogram last year.  Aortic stenosis does not appear to be severe by exam today.  Even if aortic stenosis progresses in the future, she is hesitant to be treated with invasive procedure.  Cardiac murmur is stable.  3.  Essential hypertension: Blood pressure is reasonably controlled on atenolol and amlodipine.  4.  Hyperlipidemia: Currently not on medications.  5.  Peripheral arterial disease: She does have abnormal distal pulses with evidence of moderate SFA disease on previous evaluation 2017.  She is mostly limited by knee arthritis and left foot claudication.  Continue medical therapy.    Disposition:   FU with me in 6 months.  Signed,  Lorine Bears, MD  08/05/2021 11:50 AM    [facility]

## 2021-08-05 NOTE — Patient Instructions (Signed)
Medication Instructions:  No changes *If you need a refill on your cardiac medications before your next appointment, please call your pharmacy*   Lab Work: None ordered If you have labs (blood work) drawn today and your tests are completely normal, you will receive your results only by: MyChart Message (if you have MyChart) OR A paper copy in the mail If you have any lab test that is abnormal or we need to change your treatment, we will call you to review the results.   Testing/Procedures: None ordered   Follow-Up: At [facility], you and your health needs are our priority.  As part of our continuing mission to provide you with exceptional heart care, we have created designated Provider Care Teams.  These Care Teams include your primary Cardiologist (physician) and Advanced Practice Providers (APPs -  Physician Assistants and Nurse Practitioners) who all work together to provide you with the care you need, when you need it.  We recommend signing up for the patient portal called "MyChart".  Sign up information is provided on this After Visit Summary.  MyChart is used to connect with patients for Virtual Visits (Telemedicine).  Patients are able to view lab/test results, encounter notes, upcoming appointments, etc.  Non-urgent messages can be sent to your provider as well.   To learn more about what you can do with MyChart, go to [email or web address].    Your next appointment:   6 month(s)  The format for your next appointment:   In Person  Provider:   Dr. Arida  Important Information About Sugar       

## 2021-10-02 ENCOUNTER — Other Ambulatory Visit (HOSPITAL_COMMUNITY): Payer: Self-pay | Admitting: Anesthesiology

## 2021-10-02 ENCOUNTER — Ambulatory Visit (HOSPITAL_COMMUNITY): Payer: Medicare Other

## 2021-10-02 DIAGNOSIS — M79671 Pain in right foot: Secondary | ICD-10-CM

## 2021-10-02 DIAGNOSIS — M79604 Pain in right leg: Secondary | ICD-10-CM

## 2021-10-03 ENCOUNTER — Ambulatory Visit (HOSPITAL_COMMUNITY)
Admission: RE | Admit: 2021-10-03 | Discharge: 2021-10-03 | Disposition: A | Discharge status: Home / Self Care | Payer: Medicare Other | Source: Ambulatory Visit | Attending: Anesthesiology | Admitting: Anesthesiology

## 2021-10-03 DIAGNOSIS — M79604 Pain in right leg: Secondary | ICD-10-CM | POA: Diagnosis present

## 2021-10-03 DIAGNOSIS — M79672 Pain in left foot: Secondary | ICD-10-CM

## 2021-10-03 DIAGNOSIS — M79671 Pain in right foot: Secondary | ICD-10-CM | POA: Diagnosis present

## 2021-10-03 DIAGNOSIS — M79605 Pain in left leg: Secondary | ICD-10-CM

## 2021-10-03 NOTE — Progress Notes (Signed)
ABI has been completed.   Preliminary results in CV Proc.   Aundra Millet Arretta Toenjes 10/03/2021 11:42 AM

## 2022-03-05 ENCOUNTER — Ambulatory Visit: Payer: Medicare Other | Admitting: Orthopedic Surgery

## 2022-03-05 ENCOUNTER — Ambulatory Visit: Payer: Medicare Other | Admitting: Physician Assistant

## 2022-03-05 ENCOUNTER — Encounter: Payer: Self-pay | Admitting: Orthopedic Surgery

## 2022-03-05 DIAGNOSIS — G8929 Other chronic pain: Secondary | ICD-10-CM | POA: Diagnosis not present

## 2022-03-05 DIAGNOSIS — M79675 Pain in left toe(s): Secondary | ICD-10-CM

## 2022-03-05 NOTE — Progress Notes (Signed)
Office Visit Note   Patient: Kristen Pham           Date of Birth: [birth year]           MRN: [id number] Visit Date: 03/05/2022              Requested by: Shon Baton, MD [address] [place],  [place] [address] PCP: Shon Baton, MD  Chief Complaint  Patient presents with   Left Foot - Pain   Right Foot - Pain      HPI: Patient is a 89 year old woman who presents with acute left great toe pain after a pedicure last week.  Patient denies any drainage redness or swelling.  She is concerned for possible infection.  Assessment & Plan: Visit Diagnoses:  1. Chronic toe pain, left foot     Plan: The nail was trimmed there is no signs of a paronychial infection she use a Band-Aid and antibiotic ointment.  Follow-Up Instructions: No follow-ups on file.   Ortho Exam  Patient is alert, oriented, no adenopathy, well-dressed, normal affect, normal respiratory effort. Examination patient has thickened discolored onychomycotic nails.  She has an ingrown medial border of the left great toenail.  After informed consent the nail trimmers were used to trim the nail.  There is no signs of paronychial infection there is no redness no cellulitis no drainage.  No tenderness to palpation.  Imaging: No results found. No images are attached to the encounter.  Labs: Lab Results  Component Value Date   HGBA1C 5.9 (H) 05/15/2014   ESRSEDRATE 61 (H) 12/02/2009   CRP 11.9 (H) 12/02/2009     No results found for: "ALBUMIN", "PREALBUMIN", "CBC"  No results found for: "MG" No results found for: "VD25OH"  No results found for: "PREALBUMIN"    Latest Ref Rng & Units 04/04/2016    2:11 PM 12/02/2009    9:50 PM 07/24/2009    8:36 PM  CBC EXTENDED  WBC 4.0 - 10.5 K/uL 5.9  10.6    RBC 3.87 - 5.11 MIL/uL 4.58  4.05    Hemoglobin 12.0 - 15.0 g/dL 14.0  12.4  16.0   HCT 36.0 - 46.0 % 41.9  36.9  47.0   Platelets 150 - 400 K/uL 231  306    NEUT# 1.7 - 7.7 K/uL 4.1  6.4    Lymph# 0.7 - 4.0  K/uL 1.3  2.3       There is no height or weight on file to calculate BMI.  Orders:  No orders of the defined types were placed in this encounter.  No orders of the defined types were placed in this encounter.    Procedures: No procedures performed  Clinical Data: No additional findings.  ROS:  All other systems negative, except as noted in the HPI. Review of Systems  Objective: Vital Signs: There were no vitals taken for this visit.  Specialty Comments:  No specialty comments available.  PMFS History: Patient Active Problem List   Diagnosis Date Noted   Non-pressure chronic ulcer of other part of left foot limited to breakdown of skin ([place]) 01/05/2017   Acquired claw toe, left 10/07/2016   Hard corn 10/07/2016   Chronic toe pain, left foot 08/25/2016   Meniscal cyst 08/23/2012   Knee pain, bilateral 06/21/2012   Primary osteoarthritis of both knees 06/21/2012   Generalized osteoarthritis of hand 06/21/2012   Past Medical History:  Diagnosis Date   Carpal tunnel syndrome    Hypertension  Family History  Problem Relation Age of Onset   Cerebral aneurysm Father     Past Surgical History:  Procedure Laterality Date   ABDOMINAL HYSTERECTOMY     CESAREAN SECTION     HAND SURGERY     bilateral release    TONSILECTOMY, ADENOIDECTOMY, BILATERAL MYRINGOTOMY AND TUBES     Social History   Occupational History   Occupation: retired   Tobacco Use   Smoking status: Never   Smokeless tobacco: Never  Vaping Use   Vaping Use: Never used  Substance and Sexual Activity   Alcohol use: Yes    Alcohol/week: 0.0 standard drinks of alcohol    Comment: occasionally    Drug use: No   Sexual activity: Not on file

## 2022-03-06 ENCOUNTER — Other Ambulatory Visit: Payer: Self-pay

## 2022-03-06 ENCOUNTER — Emergency Department (HOSPITAL_BASED_OUTPATIENT_CLINIC_OR_DEPARTMENT_OTHER)
Admission: EM | Admit: 2022-03-06 | Discharge: 2022-03-06 | Disposition: A | Discharge status: Home / Self Care | Payer: Medicare Other | Attending: Emergency Medicine | Admitting: Emergency Medicine

## 2022-03-06 ENCOUNTER — Encounter (HOSPITAL_BASED_OUTPATIENT_CLINIC_OR_DEPARTMENT_OTHER): Payer: Self-pay | Admitting: *Deleted

## 2022-03-06 ENCOUNTER — Emergency Department (HOSPITAL_BASED_OUTPATIENT_CLINIC_OR_DEPARTMENT_OTHER): Payer: Medicare Other | Admitting: Radiology

## 2022-03-06 ENCOUNTER — Emergency Department (HOSPITAL_BASED_OUTPATIENT_CLINIC_OR_DEPARTMENT_OTHER): Payer: Medicare Other

## 2022-03-06 DIAGNOSIS — M25551 Pain in right hip: Secondary | ICD-10-CM | POA: Diagnosis not present

## 2022-03-06 DIAGNOSIS — Z79899 Other long term (current) drug therapy: Secondary | ICD-10-CM | POA: Insufficient documentation

## 2022-03-06 DIAGNOSIS — W01198A Fall on same level from slipping, tripping and stumbling with subsequent striking against other object, initial encounter: Secondary | ICD-10-CM | POA: Insufficient documentation

## 2022-03-06 DIAGNOSIS — S0083XA Contusion of other part of head, initial encounter: Secondary | ICD-10-CM | POA: Insufficient documentation

## 2022-03-06 DIAGNOSIS — I10 Essential (primary) hypertension: Secondary | ICD-10-CM | POA: Insufficient documentation

## 2022-03-06 DIAGNOSIS — M25561 Pain in right knee: Secondary | ICD-10-CM | POA: Diagnosis not present

## 2022-03-06 DIAGNOSIS — S0093XA Contusion of unspecified part of head, initial encounter: Secondary | ICD-10-CM

## 2022-03-06 DIAGNOSIS — W19XXXA Unspecified fall, initial encounter: Secondary | ICD-10-CM

## 2022-03-06 DIAGNOSIS — S0990XA Unspecified injury of head, initial encounter: Secondary | ICD-10-CM | POA: Diagnosis present

## 2022-03-06 NOTE — ED Provider Notes (Signed)
[facility] Provider Note   CSN: [id number] Arrival date & time: 03/06/22  2002     History  Chief Complaint  Patient presents with   Kristen Pham is a 89 y.o. female.   Fall     Patient presents to the ED for evaluation after mechanical fall.  Patient has history of hypertension carpal tunnel syndrome.  She was walking when she lost her balance and ended up falling onto her right side.  Patient also struck her head and has sustained some bruising behind her right ear.  Patient did not lose consciousness.  She is not having any chest pain.  No abdominal pain.  No numbness or weakness.  She is primarily having pain on the right side of her head as well as her hip and knee  Home Medications Prior to Admission medications   Medication Sig Start Date End Date Taking? Authorizing Provider  acetaminophen (TYLENOL) 325 MG tablet Take 650 mg by mouth every 6 (six) hours as needed.    [provider]  amLODipine (NORVASC) 10 MG tablet Take 10 mg by mouth daily. 03/29/12   [provider]  atenolol (TENORMIN) 50 MG tablet Take 50 mg by mouth daily. 03/29/12   [provider]  calcium-vitamin D (OSCAL WITH D) 500-200 MG-UNIT per tablet Take 2 tablets by mouth.    [provider]  ibuprofen (ADVIL) 200 MG tablet Take 200 mg by mouth every 6 (six) hours as needed.    [provider]  mirabegron ER (MYRBETRIQ) 25 MG TB24 tablet Take 25 mg by mouth daily.    [provider]  Misc Natural Products (T-RELIEF CBD+13 SL) Place under the tongue.    [provider]  RESTASIS 0.05 % ophthalmic emulsion  05/16/18   [provider]      Allergies    Ivp dye [iodinated contrast media]    Review of Systems   Review of Systems  Physical Exam Updated Vital Signs BP (!) 166/82   Pulse 69   Temp 98.7 F (37.1 C) (Oral)   Resp 16   SpO2 100%  Physical Exam Vitals and  nursing note reviewed.  Constitutional:      General: She is not in acute distress.    Appearance: She is well-developed.  HENT:     Head: Normocephalic.     Comments: Bruising noted behind the right ear    Right Ear: External ear normal.     Left Ear: External ear normal.  Eyes:     General: No scleral icterus.       Right eye: No discharge.        Left eye: No discharge.     Conjunctiva/sclera: Conjunctivae normal.  Neck:     Trachea: No tracheal deviation.  Cardiovascular:     Rate and Rhythm: Normal rate and regular rhythm.  Pulmonary:     Effort: Pulmonary effort is normal. No respiratory distress.     Breath sounds: Normal breath sounds. No stridor. No wheezing or rales.  Abdominal:     General: Bowel sounds are normal. There is no distension.     Palpations: Abdomen is soft.     Tenderness: There is no abdominal tenderness. There is no guarding or rebound.  Musculoskeletal:        General: No deformity.     Right shoulder: Tenderness present.     Right elbow: Normal.     Right wrist:  Normal.     Cervical back: Neck supple. Tenderness present.     Thoracic back: No tenderness.     Lumbar back: No tenderness.     Right hip: Normal.     Right knee: Normal.  Skin:    General: Skin is warm and dry.     Findings: No rash.  Neurological:     General: No focal deficit present.     Mental Status: She is alert.     Cranial Nerves: No cranial nerve deficit, dysarthria or facial asymmetry.     Sensory: No sensory deficit.     Motor: No abnormal muscle tone or seizure activity.     Coordination: Coordination normal.  Psychiatric:        Mood and Affect: Mood normal.     ED Results / Procedures / Treatments   Labs (all labs ordered are listed, but only abnormal results are displayed) Labs Reviewed - No data to display  EKG None  Radiology DG Shoulder Right  Result Date: 03/06/2022 CLINICAL DATA:  Fall, pain EXAM: RIGHT SHOULDER - 2+ VIEW COMPARISON:  None  Available. FINDINGS: No fracture or dislocation of the right shoulder. Severe acromioclavicular and glenohumeral arthrosis. High riding position of the humeral head, suggesting chronic rotator cuff tear. Partially imaged right chest unremarkable IMPRESSION: 1. No fracture or dislocation of the right shoulder. 2. Severe acromioclavicular and glenohumeral arthrosis. 3. High riding position of the humeral head, suggesting chronic rotator cuff tear. Electronically Signed   By: Delanna Ahmadi M.D.   On: 03/06/2022 21:21   CT Head Wo Contrast  Result Date: 03/06/2022 CLINICAL DATA:  Head and neck trauma EXAM: CT HEAD WITHOUT CONTRAST CT CERVICAL SPINE WITHOUT CONTRAST TECHNIQUE: Multidetector CT imaging of the head and cervical spine was performed following the standard protocol without intravenous contrast. Multiplanar CT image reconstructions of the cervical spine were also generated. RADIATION DOSE REDUCTION: This exam was performed according to the departmental dose-optimization program which includes automated exposure control, adjustment of the mA and/or kV according to patient size and/or use of iterative reconstruction technique. COMPARISON:  None Available. FINDINGS: CT HEAD FINDINGS Brain: No evidence of acute infarction, hemorrhage, hydrocephalus, extra-axial collection or mass lesion/mass effect. Periventricular and deep white matter hypodensity. Vascular: No hyperdense vessel or unexpected calcification. Skull: Normal. Negative for fracture or focal lesion. Sinuses/Orbits: No acute finding. Chronic, inspissated opacification of the right maxillary sinus with bony sinus wall thickening. Other: None. CT CERVICAL SPINE FINDINGS Alignment: Degenerative straightening of the normal cervical lordosis. Skull base and vertebrae: No acute fracture. No primary bone lesion or focal pathologic process. Soft tissues and spinal canal: No prevertebral fluid or swelling. No visible canal hematoma. Disc levels: Focally  severe disc space height loss and osteophytosis of C5-C7. Upper chest: Negative. Other: Heterogeneous thyroid goiter (series 5, image 87). IMPRESSION: 1. No acute intracranial pathology. Small-vessel white matter disease. 2. No fracture or static subluxation of the cervical spine. 3. Focally severe disc space height loss and osteophytosis of C5-C7. 4. Heterogeneous thyroid goiter. In the setting of significant comorbidities or limited life expectancy, no follow-up recommended (ref: J Am Coll Radiol. 2015 Feb;12(2): 143-50). Electronically Signed   By: Delanna Ahmadi M.D.   On: 03/06/2022 21:19   CT Cervical Spine Wo Contrast  Result Date: 03/06/2022 CLINICAL DATA:  Head and neck trauma EXAM: CT HEAD WITHOUT CONTRAST CT CERVICAL SPINE WITHOUT CONTRAST TECHNIQUE: Multidetector CT imaging of the head and cervical spine was performed following the standard protocol without  intravenous contrast. Multiplanar CT image reconstructions of the cervical spine were also generated. RADIATION DOSE REDUCTION: This exam was performed according to the departmental dose-optimization program which includes automated exposure control, adjustment of the mA and/or kV according to patient size and/or use of iterative reconstruction technique. COMPARISON:  None Available. FINDINGS: CT HEAD FINDINGS Brain: No evidence of acute infarction, hemorrhage, hydrocephalus, extra-axial collection or mass lesion/mass effect. Periventricular and deep white matter hypodensity. Vascular: No hyperdense vessel or unexpected calcification. Skull: Normal. Negative for fracture or focal lesion. Sinuses/Orbits: No acute finding. Chronic, inspissated opacification of the right maxillary sinus with bony sinus wall thickening. Other: None. CT CERVICAL SPINE FINDINGS Alignment: Degenerative straightening of the normal cervical lordosis. Skull base and vertebrae: No acute fracture. No primary bone lesion or focal pathologic process. Soft tissues and spinal  canal: No prevertebral fluid or swelling. No visible canal hematoma. Disc levels: Focally severe disc space height loss and osteophytosis of C5-C7. Upper chest: Negative. Other: Heterogeneous thyroid goiter (series 5, image 87). IMPRESSION: 1. No acute intracranial pathology. Small-vessel white matter disease. 2. No fracture or static subluxation of the cervical spine. 3. Focally severe disc space height loss and osteophytosis of C5-C7. 4. Heterogeneous thyroid goiter. In the setting of significant comorbidities or limited life expectancy, no follow-up recommended (ref: J Am Coll Radiol. 2015 Feb;12(2): 143-50). Electronically Signed   By: Delanna Ahmadi M.D.   On: 03/06/2022 21:19   DG Wrist Complete Right  Result Date: 03/06/2022 CLINICAL DATA:  Recent fall with right wrist pain, initial encounter EXAM: RIGHT WRIST - COMPLETE 3+ VIEW COMPARISON:  None Available. FINDINGS: Degenerative changes are noted the first [facility] joint. Cartilage calcification is noted within the radiocarpal and ulnar carpal joint. No acute fracture or dislocation is seen. No soft tissue changes are noted. IMPRESSION: Degenerative change without acute bony abnormality. Electronically Signed   By: Inez Catalina M.D.   On: 03/06/2022 20:55   DG Hip Unilat  With Pelvis 2-3 Views Right  Result Date: 03/06/2022 CLINICAL DATA:  Fall. EXAM: DG HIP (WITH OR WITHOUT PELVIS) 2-3V RIGHT COMPARISON:  None Available. FINDINGS: There is no evidence of hip fracture or dislocation. Mild degenerative changes are noted at the hips bilaterally and in the lower lumbar spine. Cluster of calcifications is present in the right upper quadrant, likely cholelithiasis. Vascular calcifications are noted in the pelvis and lower extremities bilaterally. IMPRESSION: 1. No acute fracture or dislocation. 2. Degenerative changes in the lower lumbar spine and bilateral hips. 3. Cholelithiasis. Electronically Signed   By: Brett Fairy M.D.   On: 03/06/2022 20:54   DG  Knee Complete 4 Views Right  Result Date: 03/06/2022 CLINICAL DATA:  Recent fall with right knee pain, initial encounter EXAM: RIGHT KNEE - COMPLETE 4+ VIEW COMPARISON:  None Available. FINDINGS: Tricompartmental degenerative changes of the right knee joint are seen. No joint effusion is noted. No acute fracture or dislocation is seen. Vascular calcifications are noted. IMPRESSION: Degenerative change without acute abnormality. Electronically Signed   By: Inez Catalina M.D.   On: 03/06/2022 20:54    Procedures Procedures    Medications Ordered in ED Medications - No data to display  ED Course/ Medical Decision Making/ A&P Clinical Course as of 03/06/22 2247  Fri Mar 06, 2022  2101 Knee wrist and hip/pelvis x-ray without acute fracture or dislocation [JK]  2220 Shoulder x-ray without acute fracture.  Probable chronic rotator cuff injury [JK]  2220 Head CT and C-spine CT without acute fracture [JK]  Clinical Course User Index [JK] Dorie Rank, MD                             Medical Decision Making Problems Addressed: Contusion of head, unspecified part of head, initial encounter: acute illness or injury that poses a threat to life or bodily functions Fall, initial encounter: acute illness or injury that poses a threat to life or bodily functions  Amount and/or Complexity of Data Reviewed Radiology: ordered and independent interpretation performed.   Patient presented to the ED for evaluation after mechanical fall.  Patient denies any acute medical complaints.  X-rays do not show any signs of serious injury.  No fracture or dislocation.  No signs of subarachnoid hemorrhage or cerebral contusion.  Will discharge home with over-the-counter pain management.  Warning signs precautions discussed.        Final Clinical Impression(s) / ED Diagnoses Final diagnoses:  Fall, initial encounter  Contusion of head, unspecified part of head, initial encounter    Rx / DC Orders ED  Discharge Orders     None         Dorie Rank, MD 03/06/22 2248

## 2022-03-06 NOTE — Discharge Instructions (Signed)
The x-rays and CT scans did not show signs of any serious injury.  Take over-the-counter medications as needed for pain.  Follow-up with your doctor next week to be rechecked if your symptoms or not improving

## 2022-03-06 NOTE — ED Notes (Signed)
XR at bedside

## 2022-03-06 NOTE — ED Triage Notes (Signed)
Pt had a mechanical fall PTA.  Pt lost her balance and made a full turn and fell on her right side, no LOC, pt denies any blood thinners.  Pt has bruising to right ear.  Fall was witnessed by her granddaughter.  Pt has soreness not right wrist, no swelling noted.  Pt also has some soreness in right hip and knee as well.

## 2022-03-17 ENCOUNTER — Ambulatory Visit: Payer: Medicare Other | Admitting: Cardiovascular Disease

## 2022-03-31 ENCOUNTER — Ambulatory Visit: Payer: Medicare Other | Admitting: Cardiovascular Disease

## 2022-04-21 DIAGNOSIS — I739 Peripheral vascular disease, unspecified: Secondary | ICD-10-CM | POA: Insufficient documentation

## 2022-05-19 ENCOUNTER — Ambulatory Visit: Payer: Medicare Other | Attending: Cardiovascular Disease | Admitting: Cardiovascular Disease

## 2022-05-19 ENCOUNTER — Encounter: Payer: Self-pay | Admitting: Cardiovascular Disease

## 2022-05-19 VITALS — BP 138/60 | HR 70 | Ht 59.0 in | Wt 101.2 lb

## 2022-05-19 DIAGNOSIS — I739 Peripheral vascular disease, unspecified: Secondary | ICD-10-CM | POA: Diagnosis not present

## 2022-05-19 DIAGNOSIS — I359 Nonrheumatic aortic valve disorder, unspecified: Secondary | ICD-10-CM

## 2022-05-19 DIAGNOSIS — E785 Hyperlipidemia, unspecified: Secondary | ICD-10-CM | POA: Diagnosis not present

## 2022-05-19 DIAGNOSIS — I10 Essential (primary) hypertension: Secondary | ICD-10-CM

## 2022-05-19 MED ORDER — ATORVASTATIN CALCIUM 20 MG PO TABS: 20.0000 mg | ORAL_TABLET | Freq: Every day | ORAL | 3 refills | Status: DC

## 2022-05-19 NOTE — Progress Notes (Signed)
Cardiology Office Note   Date:  05/19/2022   ID:  Kristen Pham, DOB [birth year], MRN [id number]  PCP:  Kristen Corn, MD  Cardiologist:   Lorine Bears, MD   No chief complaint on file.     History of Present Illness: Kristen Pham is a 89 y.o. female who is here for a follow-up visit regarding exertional dyspnea, aortic stenosis and peripheral arterial disease. She has known history of essential hypertension, hyperlipidemia and arthritis.  She is not a smoker and does not have diabetes. She was seen by me in 2017 for leg pain with concerns for peripheral arterial disease. She underwent noninvasive vascular studies in 2017 which showed noncompressible vessels with moderate SFA disease bilaterally.  There was no evidence of obstructive disease at that time. She underwent cardiac evaluation in 2022 due to worsening exertional dyspnea.  Echocardiogram was done in March 2022 and showed  normal LV systolic function with mild aortic stenosis with mean gradient of 11 mmHg.  Lexiscan Myoview showed no evidence of ischemia with normal ejection fraction.  She had repeat Doppler studies in September 2023 which showed an ABI of 0.69 on the right and 0.77 on the left.  She has  deformities in her toes with some callus that is improving with spacers.  Currently with no open ulceration.  She moved to her assisted living facility 2 weeks ago.  Her functional capacity declined over the last year with some shortness of breath but no chest pain.   Past Medical History:  Diagnosis Date   Carpal tunnel syndrome    Hypertension     Past Surgical History:  Procedure Laterality Date   ABDOMINAL HYSTERECTOMY     CESAREAN SECTION     HAND SURGERY     bilateral release    TONSILECTOMY, ADENOIDECTOMY, BILATERAL MYRINGOTOMY AND TUBES       Current Outpatient Medications  Medication Sig Dispense Refill   acetaminophen (TYLENOL) 325 MG tablet Take 650 mg by mouth every 6 (six) hours as needed.      amLODipine (NORVASC) 10 MG tablet Take 10 mg by mouth daily.     aspirin EC 81 MG tablet Take 81 mg by mouth once.     atenolol (TENORMIN) 50 MG tablet Take 50 mg by mouth daily.     atorvastatin (LIPITOR) 20 MG tablet Take 1 tablet (20 mg total) by mouth daily. 90 tablet 3   calcium-vitamin D (OSCAL WITH D) 500-200 MG-UNIT per tablet Take 2 tablets by mouth.     ibuprofen (ADVIL) 200 MG tablet Take 200 mg by mouth every 6 (six) hours as needed.     mirabegron ER (MYRBETRIQ) 25 MG TB24 tablet Take 25 mg by mouth daily.     Misc Natural Products (T-RELIEF CBD+13 SL) Place under the tongue.     RESTASIS 0.05 % ophthalmic emulsion      Current Facility-Administered Medications  Medication Dose Route Frequency Provider Last Rate Last Admin   triamcinolone acetonide (KENALOG) 10 MG/ML injection 10 mg  10 mg Other Once Asencion Islam, DPM        Allergies:   Ivp dye [iodinated contrast media]    Social History:  The patient  reports that she has never smoked. She has never used smokeless tobacco. She reports current alcohol use. She reports that she does not use drugs.   Family History:  The patient's family history includes Cerebral aneurysm in her father.    ROS:  Please see the history of  present illness.   Otherwise, review of systems are positive for none.   All other systems are reviewed and negative.    PHYSICAL EXAM: VS:  BP 138/60   Pulse 70   Ht 4\' 11"  (1.499 m)   Wt 101 lb 3.2 oz (45.9 kg)   SpO2 98%   BMI 20.44 kg/m  , BMI Body mass index is 20.44 kg/m. GEN: Well nourished, well developed, in no acute distress  HEENT: normal  Neck: no JVD, carotid bruits, or masses Cardiac: RRR; no rubs, or gallops,no edema .2/ 6 systolic ejection murmur in the aortic area which is mid peaking. Respiratory:  clear to auscultation bilaterally, normal work of breathing GI: soft, nontender, nondistended, + BS. No abdominal bruit MS: no deformity or atrophy  Skin: warm and dry, no  rash Neuro:  Strength and sensation are intact Psych: euthymic mood, full affect Vascular: Distal pulses are not palpable.  EKG:  EKG is ordered today. EKG showed normal sinus rhythm with no significant ST or T wave changes.  Moderate LVH.   Recent Labs: No results found for requested labs within last 365 days.    Lipid Panel No results found for: "CHOL", "TRIG", "HDL", "CHOLHDL", "VLDL", "LDLCALC", "LDLDIRECT"    Wt Readings from Last 3 Encounters:  05/19/22 101 lb 3.2 oz (45.9 kg)  08/05/21 104 lb 9.6 oz (47.4 kg)  02/04/21 105 lb (47.6 kg)           No data to display            ASSESSMENT AND PLAN:  1.  Peripheral arterial disease: She has moderately reduced ABI bilaterally with abnormal distal pulses.  Currently with no claudication and no open ulceration.  Continue close observation and will only consider angiography for worsening lower extremity ischemia symptoms.  2.  Aortic stenosis: Likely in the moderate to severe range but I suspect that she is likely not a candidate for TAVR considering her age and decline in functional capacity.  3.  Essential hypertension: Blood pressure is reasonably controlled on atenolol and amlodipine.  4.  Hyperlipidemia: Given her LAD and aortic stenosis, I elected to resume atorvastatin 20 mg daily.   Disposition:   FU with me in 4 months.  Signed,  Lorine Bears, MD  05/19/2022 12:43 PM     [facility]

## 2022-05-19 NOTE — Patient Instructions (Signed)
Medication Instructions:  START Atorvastatin (Lipitor) 20 mg once daily  *If you need a refill on your cardiac medications before your next appointment, please call your pharmacy*   Lab Work: None ordered If you have labs (blood work) drawn today and your tests are completely normal, you will receive your results only by: MyChart Message (if you have MyChart) OR A paper copy in the mail If you have any lab test that is abnormal or we need to change your treatment, we will call you to review the results.   Testing/Procedures: None ordered   Follow-Up: At [facility], you and your health needs are our priority.  As part of our continuing mission to provide you with exceptional heart care, we have created designated Provider Care Teams.  These Care Teams include your primary Cardiologist (physician) and Advanced Practice Providers (APPs -  Physician Assistants and Nurse Practitioners) who all work together to provide you with the care you need, when you need it.  We recommend signing up for the patient portal called "MyChart".  Sign up information is provided on this After Visit Summary.  MyChart is used to connect with patients for Virtual Visits (Telemedicine).  Patients are able to view lab/test results, encounter notes, upcoming appointments, etc.  Non-urgent messages can be sent to your provider as well.   To learn more about what you can do with MyChart, go to [email or web address].    Your next appointment:   4 month(s)  Provider:   Dr. Kirke Corin

## 2022-05-28 ENCOUNTER — Ambulatory Visit: Payer: Medicare Other | Admitting: Orthopedic Surgery

## 2022-05-28 DIAGNOSIS — M79674 Pain in right toe(s): Secondary | ICD-10-CM

## 2022-05-28 DIAGNOSIS — G8929 Other chronic pain: Secondary | ICD-10-CM | POA: Diagnosis not present

## 2022-05-28 DIAGNOSIS — M79675 Pain in left toe(s): Secondary | ICD-10-CM

## 2022-05-29 ENCOUNTER — Encounter: Payer: Self-pay | Admitting: Orthopedic Surgery

## 2022-05-29 NOTE — Progress Notes (Signed)
Office Visit Note   Patient: Kristen Pham           Date of Birth: 05-23-1924           MRN: [id number] Visit Date: 05/28/2022              Requested by: Creola Corn, MD [address] [place],  [place] [address] PCP: Creola Corn, MD  Chief Complaint  Patient presents with   Left Foot - Wound Check      HPI: Patient is a 89 year old woman who presents with bilateral forefoot pain with bunions and clawing of the toes bilaterally.  Patient complains of pain third toe right foot.  Assessment & Plan: Visit Diagnoses:  1. Chronic toe pain, left foot   2. Chronic toe pain, right foot     Plan: Spacers were placed between her toes callus was pared x 3.  Recommended extra-depth shoes like new balance walking sneakers.  Follow-Up Instructions: Return if symptoms worsen or fail to improve.   Ortho Exam  Patient is alert, oriented, no adenopathy, well-dressed, normal affect, normal respiratory effort. Examination patient does not have palpable pulses.  She does have hallux valgus deformity bilaterally with pronation and overlapping of the great toe over the second toe bilaterally.  There is not ulcer in the second webspace third toe.  She has an ulcer on the great toe that was pared.  Callus was pared also on the left great toe and right third toe.  Imaging: No results found. No images are attached to the encounter.  Labs: Lab Results  Component Value Date   HGBA1C 5.9 (H) 05/15/2014   ESRSEDRATE 61 (H) 12/02/2009   CRP 11.9 (H) 12/02/2009     No results found for: "ALBUMIN", "PREALBUMIN", "CBC"  No results found for: "MG" No results found for: "VD25OH"  No results found for: "PREALBUMIN"    Latest Ref Rng & Units 04/04/2016    2:11 PM 12/02/2009    9:50 PM 07/24/2009    8:36 PM  CBC EXTENDED  WBC 4.0 - 10.5 K/uL 5.9  10.6    RBC 3.87 - 5.11 MIL/uL 4.58  4.05    Hemoglobin 12.0 - 15.0 g/dL 19.1  47.8  29.5   HCT 36.0 - 46.0 % 41.9  36.9  47.0   Platelets 150 -  400 K/uL 231  306    NEUT# 1.7 - 7.7 K/uL 4.1  6.4    Lymph# 0.7 - 4.0 K/uL 1.3  2.3       There is no height or weight on file to calculate BMI.  Orders:  No orders of the defined types were placed in this encounter.  No orders of the defined types were placed in this encounter.    Procedures: No procedures performed  Clinical Data: No additional findings.  ROS:  All other systems negative, except as noted in the HPI. Review of Systems  Objective: Vital Signs: There were no vitals taken for this visit.  Specialty Comments:  No specialty comments available.  PMFS History: Patient Active Problem List   Diagnosis Date Noted   Non-pressure chronic ulcer of other part of left foot limited to breakdown of skin (HCC) 01/05/2017   Acquired claw toe, left 10/07/2016   Hard corn 10/07/2016   Chronic toe pain, left foot 08/25/2016   Meniscal cyst 08/23/2012   Knee pain, bilateral 06/21/2012   Primary osteoarthritis of both knees 06/21/2012   Generalized osteoarthritis of hand 06/21/2012   Past Medical History:  Diagnosis Date   Carpal tunnel syndrome    Hypertension     Family History  Problem Relation Age of Onset   Cerebral aneurysm Father     Past Surgical History:  Procedure Laterality Date   ABDOMINAL HYSTERECTOMY     CESAREAN SECTION     HAND SURGERY     bilateral release    TONSILECTOMY, ADENOIDECTOMY, BILATERAL MYRINGOTOMY AND TUBES     Social History   Occupational History   Occupation: retired   Tobacco Use   Smoking status: Never   Smokeless tobacco: Never  Vaping Use   Vaping Use: Never used  Substance and Sexual Activity   Alcohol use: Yes    Alcohol/week: 0.0 standard drinks of alcohol    Comment: occasionally    Drug use: No   Sexual activity: Not on file

## 2022-06-22 ENCOUNTER — Encounter: Payer: Self-pay | Admitting: Orthopedic Surgery

## 2022-06-22 ENCOUNTER — Ambulatory Visit: Payer: Medicare Other | Admitting: Orthopedic Surgery

## 2022-06-22 DIAGNOSIS — L97521 Non-pressure chronic ulcer of other part of left foot limited to breakdown of skin: Secondary | ICD-10-CM | POA: Diagnosis not present

## 2022-06-22 DIAGNOSIS — L97511 Non-pressure chronic ulcer of other part of right foot limited to breakdown of skin: Secondary | ICD-10-CM

## 2022-06-22 NOTE — Progress Notes (Signed)
Office Visit Note   Patient: Kristen Pham           Date of Birth: [birth year]           MRN: [id number] Visit Date: 06/22/2022              Requested by: Creola Corn, MD [address] [place],  [place] [address] PCP: Creola Corn, MD  Chief Complaint  Patient presents with   Left Foot - Wound Check    3rd toe sore      HPI: Patient is a 89 year old woman who presents in follow-up with recurrent painful calluses on both feet between her toes.  Assessment & Plan: Visit Diagnoses:  1. Ulcer of both feet, limited to breakdown of skin (HCC)     Plan: Calluses were pared she was given some different spacers  Follow-Up Instructions: Return in about 4 weeks (around 07/20/2022).   Ortho Exam  Patient is alert, oriented, no adenopathy, well-dressed, normal affect, normal respiratory effort. Examination patient has no plantar ulcers.  She does have a callus on the left great toe first webspace as well as a callus on the third toe second webspace.  After informed consent a 10 blade knife was used to pare the calluses.  The right third toe was touched with silver nitrate and a Band-Aid was applied she was given spacers for the webspaces.  Imaging: No results found. No images are attached to the encounter.  Labs: Lab Results  Component Value Date   HGBA1C 5.9 (H) 05/15/2014   ESRSEDRATE 61 (H) 12/02/2009   CRP 11.9 (H) 12/02/2009     No results found for: "ALBUMIN", "PREALBUMIN", "CBC"  No results found for: "MG" No results found for: "VD25OH"  No results found for: "PREALBUMIN"    Latest Ref Rng & Units 04/04/2016    2:11 PM 12/02/2009    9:50 PM 07/24/2009    8:36 PM  CBC EXTENDED  WBC 4.0 - 10.5 K/uL 5.9  10.6    RBC 3.87 - 5.11 MIL/uL 4.58  4.05    Hemoglobin 12.0 - 15.0 g/dL 19.1  47.8  29.5   HCT 36.0 - 46.0 % 41.9  36.9  47.0   Platelets 150 - 400 K/uL 231  306    NEUT# 1.7 - 7.7 K/uL 4.1  6.4    Lymph# 0.7 - 4.0 K/uL 1.3  2.3       There is no height or  weight on file to calculate BMI.  Orders:  No orders of the defined types were placed in this encounter.  No orders of the defined types were placed in this encounter.    Procedures: No procedures performed  Clinical Data: No additional findings.  ROS:  All other systems negative, except as noted in the HPI. Review of Systems  Objective: Vital Signs: There were no vitals taken for this visit.  Specialty Comments:  No specialty comments available.  PMFS History: Patient Active Problem List   Diagnosis Date Noted   Non-pressure chronic ulcer of other part of left foot limited to breakdown of skin (HCC) 01/05/2017   Acquired claw toe, left 10/07/2016   Hard corn 10/07/2016   Chronic toe pain, left foot 08/25/2016   Meniscal cyst 08/23/2012   Knee pain, bilateral 06/21/2012   Primary osteoarthritis of both knees 06/21/2012   Generalized osteoarthritis of hand 06/21/2012   Past Medical History:  Diagnosis Date   Carpal tunnel syndrome    Hypertension     Family  History  Problem Relation Age of Onset   Cerebral aneurysm Father     Past Surgical History:  Procedure Laterality Date   ABDOMINAL HYSTERECTOMY     CESAREAN SECTION     HAND SURGERY     bilateral release    TONSILECTOMY, ADENOIDECTOMY, BILATERAL MYRINGOTOMY AND TUBES     Social History   Occupational History   Occupation: retired   Tobacco Use   Smoking status: Never   Smokeless tobacco: Never  Vaping Use   Vaping Use: Never used  Substance and Sexual Activity   Alcohol use: Yes    Alcohol/week: 0.0 standard drinks of alcohol    Comment: occasionally    Drug use: No   Sexual activity: Not on file

## 2022-07-27 ENCOUNTER — Ambulatory Visit: Payer: Medicare Other | Admitting: Orthopedic Surgery

## 2022-07-27 ENCOUNTER — Encounter: Payer: Self-pay | Admitting: Orthopedic Surgery

## 2022-07-27 DIAGNOSIS — L97511 Non-pressure chronic ulcer of other part of right foot limited to breakdown of skin: Secondary | ICD-10-CM | POA: Diagnosis not present

## 2022-07-27 DIAGNOSIS — L97521 Non-pressure chronic ulcer of other part of left foot limited to breakdown of skin: Secondary | ICD-10-CM | POA: Diagnosis not present

## 2022-07-27 NOTE — Progress Notes (Signed)
Office Visit Note   Patient: Kristen Pham           Date of Birth: Jan 07, 1925           MRN: [id number] Visit Date: 07/27/2022              Requested by: Creola Corn, MD [address] [place],  [place] [address] PCP: Creola Corn, MD  Chief Complaint  Patient presents with   Right Foot - Follow-up      HPI: Patient is a 89 year old woman who was seen in follow-up for interdigital calluses.  Patient has been wearing compression stockings.  Assessment & Plan: Visit Diagnoses:  1. Ulcer of both feet, limited to breakdown of skin (HCC)     Plan: Continue with compression stockings continue to use the pads between the toes to protect from pressure.  Follow-Up Instructions: No follow-ups on file.   Ortho Exam  Patient is alert, oriented, no adenopathy, well-dressed, normal affect, normal respiratory effort. Examination of the left leg patient has had some muscle cramping in the medial aspect there is no tenderness to palpation no sign of DVT.  No cellulitis no palpable mass.  Examination of the toes on the left foot there is no ulcers no hypertrophic callus no signs of infection.  Examination of the right foot there are no interdigital ulcers no callus no signs of infection.  Imaging: No results found. No images are attached to the encounter.  Labs: Lab Results  Component Value Date   HGBA1C 5.9 (H) 05/15/2014   ESRSEDRATE 61 (H) 12/02/2009   CRP 11.9 (H) 12/02/2009     No results found for: "ALBUMIN", "PREALBUMIN", "CBC"  No results found for: "MG" No results found for: "VD25OH"  No results found for: "PREALBUMIN"    Latest Ref Rng & Units 04/04/2016    2:11 PM 12/02/2009    9:50 PM 07/24/2009    8:36 PM  CBC EXTENDED  WBC 4.0 - 10.5 K/uL 5.9  10.6    RBC 3.87 - 5.11 MIL/uL 4.58  4.05    Hemoglobin 12.0 - 15.0 g/dL 19.1  47.8  29.5   HCT 36.0 - 46.0 % 41.9  36.9  47.0   Platelets 150 - 400 K/uL 231  306    NEUT# 1.7 - 7.7 K/uL 4.1  6.4    Lymph# 0.7 - 4.0  K/uL 1.3  2.3       There is no height or weight on file to calculate BMI.  Orders:  No orders of the defined types were placed in this encounter.  No orders of the defined types were placed in this encounter.    Procedures: No procedures performed  Clinical Data: No additional findings.  ROS:  All other systems negative, except as noted in the HPI. Review of Systems  Objective: Vital Signs: There were no vitals taken for this visit.  Specialty Comments:  No specialty comments available.  PMFS History: Patient Active Problem List   Diagnosis Date Noted   Non-pressure chronic ulcer of other part of left foot limited to breakdown of skin (HCC) 01/05/2017   Acquired claw toe, left 10/07/2016   Hard corn 10/07/2016   Chronic toe pain, left foot 08/25/2016   Meniscal cyst 08/23/2012   Knee pain, bilateral 06/21/2012   Primary osteoarthritis of both knees 06/21/2012   Generalized osteoarthritis of hand 06/21/2012   Past Medical History:  Diagnosis Date   Carpal tunnel syndrome    Hypertension     Family  History  Problem Relation Age of Onset   Cerebral aneurysm Father     Past Surgical History:  Procedure Laterality Date   ABDOMINAL HYSTERECTOMY     CESAREAN SECTION     HAND SURGERY     bilateral release    TONSILECTOMY, ADENOIDECTOMY, BILATERAL MYRINGOTOMY AND TUBES     Social History   Occupational History   Occupation: retired   Tobacco Use   Smoking status: Never   Smokeless tobacco: Never  Vaping Use   Vaping Use: Never used  Substance and Sexual Activity   Alcohol use: Yes    Alcohol/week: 0.0 standard drinks of alcohol    Comment: occasionally    Drug use: No   Sexual activity: Not on file

## 2022-09-10 ENCOUNTER — Encounter: Payer: Self-pay | Admitting: Orthopedic Surgery

## 2022-09-10 ENCOUNTER — Ambulatory Visit: Payer: Medicare Other | Admitting: Orthopedic Surgery

## 2022-09-10 DIAGNOSIS — M25551 Pain in right hip: Secondary | ICD-10-CM | POA: Diagnosis not present

## 2022-09-10 DIAGNOSIS — G8929 Other chronic pain: Secondary | ICD-10-CM

## 2022-09-10 DIAGNOSIS — M79674 Pain in right toe(s): Secondary | ICD-10-CM

## 2022-09-10 DIAGNOSIS — M5431 Sciatica, right side: Secondary | ICD-10-CM

## 2022-09-10 DIAGNOSIS — L97511 Non-pressure chronic ulcer of other part of right foot limited to breakdown of skin: Secondary | ICD-10-CM

## 2022-09-10 DIAGNOSIS — L97521 Non-pressure chronic ulcer of other part of left foot limited to breakdown of skin: Secondary | ICD-10-CM

## 2022-09-10 NOTE — Progress Notes (Signed)
Office Visit Note   Patient: Kristen Pham           Date of Birth: 1924/08/18           MRN: [id number] Visit Date: 09/10/2022              Requested by: Creola Corn, MD [address] [place],  [place] [address] PCP: Creola Corn, MD  Chief Complaint  Patient presents with   Right Foot - Wound Check      HPI: Patient is a 89 year old woman who is seen for 3 separate issues.  She has had right hip pain and right-sided radicular pain and an ulcer on the third toe.  Assessment & Plan: Visit Diagnoses:  1. Chronic toe pain, right foot   2. Ulcer of both feet, limited to breakdown of skin (HCC)     Plan: Patient was given a thin spacer to place in the webspace to protect the third toe.  Discussed that if the sciatic pain worsen she could follow-up for that.  She has just been seen at [facility] for an injection for the right hip.  Follow-Up Instructions: No follow-ups on file.   Ortho Exam  Patient is alert, oriented, no adenopathy, well-dressed, normal affect, normal respiratory effort. Examination patient has a negative sciatic stretch test on the right.  She has improvement in the hip pain after the injection.  Her sciatic pain radiates from the buttocks to the lateral aspect of the right calf.  Examination of her toes she does have a pressure ulcer on the third toe she was using a very wide spacer I gave her some smaller spacers and she will use something equivalent as needed.  Imaging: No results found. No images are attached to the encounter.  Labs: Lab Results  Component Value Date   HGBA1C 5.9 (H) 05/15/2014   ESRSEDRATE 61 (H) 12/02/2009   CRP 11.9 (H) 12/02/2009     No results found for: "ALBUMIN", "PREALBUMIN", "CBC"  No results found for: "MG" No results found for: "VD25OH"  No results found for: "PREALBUMIN"    Latest Ref Rng & Units 04/04/2016    2:11 PM 12/02/2009    9:50 PM 07/24/2009    8:36 PM  CBC EXTENDED  WBC 4.0 - 10.5 K/uL 5.9  10.6     RBC 3.87 - 5.11 MIL/uL 4.58  4.05    Hemoglobin 12.0 - 15.0 g/dL 62.1  30.8  65.7   HCT 36.0 - 46.0 % 41.9  36.9  47.0   Platelets 150 - 400 K/uL 231  306    NEUT# 1.7 - 7.7 K/uL 4.1  6.4    Lymph# 0.7 - 4.0 K/uL 1.3  2.3       There is no height or weight on file to calculate BMI.  Orders:  No orders of the defined types were placed in this encounter.  No orders of the defined types were placed in this encounter.    Procedures: No procedures performed  Clinical Data: No additional findings.  ROS:  All other systems negative, except as noted in the HPI. Review of Systems  Objective: Vital Signs: There were no vitals taken for this visit.  Specialty Comments:  No specialty comments available.  PMFS History: Patient Active Problem List   Diagnosis Date Noted   Non-pressure chronic ulcer of other part of left foot limited to breakdown of skin (HCC) 01/05/2017   Acquired claw toe, left 10/07/2016   Hard corn 10/07/2016  Chronic toe pain, left foot 08/25/2016   Meniscal cyst 08/23/2012   Knee pain, bilateral 06/21/2012   Primary osteoarthritis of both knees 06/21/2012   Generalized osteoarthritis of hand 06/21/2012   Past Medical History:  Diagnosis Date   Carpal tunnel syndrome    Hypertension     Family History  Problem Relation Age of Onset   Cerebral aneurysm Father     Past Surgical History:  Procedure Laterality Date   ABDOMINAL HYSTERECTOMY     CESAREAN SECTION     HAND SURGERY     bilateral release    TONSILECTOMY, ADENOIDECTOMY, BILATERAL MYRINGOTOMY AND TUBES     Social History   Occupational History   Occupation: retired   Tobacco Use   Smoking status: Never   Smokeless tobacco: Never  Vaping Use   Vaping status: Never Used  Substance and Sexual Activity   Alcohol use: Yes    Alcohol/week: 0.0 standard drinks of alcohol    Comment: occasionally    Drug use: No   Sexual activity: Not on file

## 2022-09-11 ENCOUNTER — Telehealth: Payer: Self-pay | Admitting: Orthopedic Surgery

## 2022-09-11 NOTE — Telephone Encounter (Signed)
Received call from Pearlie Oyster Nurse Liaison with [facility] needing verbal orders for nursing  (PT and OT)   Kristen Pham also asked if the last visit note can be faxed over to her. The fax# is [phone]   The number to contact Kristen Pham is [phone]

## 2022-09-11 NOTE — Telephone Encounter (Signed)
SW Victorino Dike, gave her verbal okay for PT and OT. They will look [facility] toe [facility]. Faxing over OV notes and Rx for pt/ot.

## 2022-09-15 ENCOUNTER — Ambulatory Visit: Payer: Medicare Other | Admitting: Family

## 2022-09-15 DIAGNOSIS — L97521 Non-pressure chronic ulcer of other part of left foot limited to breakdown of skin: Secondary | ICD-10-CM | POA: Diagnosis not present

## 2022-09-15 DIAGNOSIS — M79675 Pain in left toe(s): Secondary | ICD-10-CM

## 2022-09-15 DIAGNOSIS — M205X2 Other deformities of toe(s) (acquired), left foot: Secondary | ICD-10-CM

## 2022-09-15 DIAGNOSIS — G8929 Other chronic pain: Secondary | ICD-10-CM

## 2022-09-15 DIAGNOSIS — L03032 Cellulitis of left toe: Secondary | ICD-10-CM | POA: Diagnosis not present

## 2022-09-15 MED ORDER — GABAPENTIN 100 MG PO CAPS: 100.0000 mg | ORAL_CAPSULE | Freq: Three times a day (TID) | ORAL | 1 refills | Status: DC

## 2022-09-15 MED ORDER — DOXYCYCLINE HYCLATE 100 MG PO TABS: 100.0000 mg | ORAL_TABLET | Freq: Two times a day (BID) | ORAL | 0 refills | Status: DC

## 2022-09-15 NOTE — Progress Notes (Unsigned)
Office Visit Note   Patient: Kristen Pham           Date of Birth: 11/29/1924           MRN: [id number] Visit Date: 09/15/2022              Requested by: Creola Corn, MD [address] [place],  [place] [address] PCP: Creola Corn, MD  Chief Complaint  Patient presents with   Right Foot - Wound Check    3rd toe ulcer pain, was here last week for check up on same toe      HPI: The patient is a 89 year old woman seen in follow-up for pain to the right third toe.  She has had issues with ischemic pain to this toe for quite some time.  Also has a chronic ulcer this has been debrided serially.  Was just seen in the office last week given new thinner spacers to use in her webspaces unfortunately the pain has been worsening over the last 1 week denies fevers or chills  Would like to try gabapentin and tramadol for her pain  Assessment & Plan: Visit Diagnoses: No diagnosis found.  Plan: Concern for cellulitis of the third toe as well will place on a short course of antibiotics provided a prescription for gabapentin for her pain  Follow-Up Instructions: No follow-ups on file.   Ortho Exam  Patient is alert, oriented, no adenopathy, well-dressed, normal affect, normal respiratory effort. On examination of the right foot there is no edema or erythema.  She does have the ulcer in the second webspace to the third toe which is 5 mm in diameter there is no drainage however this is quite tender there is mild edema of the toe no sausage digit swelling  Imaging: No results found. No images are attached to the encounter.  Labs: Lab Results  Component Value Date   HGBA1C 5.9 (H) 05/15/2014   ESRSEDRATE 61 (H) 12/02/2009   CRP 11.9 (H) 12/02/2009     No results found for: "ALBUMIN", "PREALBUMIN", "CBC"  No results found for: "MG" No results found for: "VD25OH"  No results found for: "PREALBUMIN"    Latest Ref Rng & Units 04/04/2016    2:11 PM 12/02/2009    9:50 PM 07/24/2009     8:36 PM  CBC EXTENDED  WBC 4.0 - 10.5 K/uL 5.9  10.6    RBC 3.87 - 5.11 MIL/uL 4.58  4.05    Hemoglobin 12.0 - 15.0 g/dL 86.5  78.4  69.6   HCT 36.0 - 46.0 % 41.9  36.9  47.0   Platelets 150 - 400 K/uL 231  306    NEUT# 1.7 - 7.7 K/uL 4.1  6.4    Lymph# 0.7 - 4.0 K/uL 1.3  2.3       There is no height or weight on file to calculate BMI.  Orders:  No orders of the defined types were placed in this encounter.  No orders of the defined types were placed in this encounter.    Procedures: No procedures performed  Clinical Data: No additional findings.  ROS:  All other systems negative, except as noted in the HPI. Review of Systems  Objective: Vital Signs: There were no vitals taken for this visit.  Specialty Comments:  No specialty comments available.  PMFS History: Patient Active Problem List   Diagnosis Date Noted   Non-pressure chronic ulcer of other part of left foot limited to breakdown of skin (HCC) 01/05/2017   Acquired  claw toe, left 10/07/2016   Hard corn 10/07/2016   Chronic toe pain, left foot 08/25/2016   Meniscal cyst 08/23/2012   Knee pain, bilateral 06/21/2012   Primary osteoarthritis of both knees 06/21/2012   Generalized osteoarthritis of hand 06/21/2012   Past Medical History:  Diagnosis Date   Carpal tunnel syndrome    Hypertension     Family History  Problem Relation Age of Onset   Cerebral aneurysm Father     Past Surgical History:  Procedure Laterality Date   ABDOMINAL HYSTERECTOMY     CESAREAN SECTION     HAND SURGERY     bilateral release    TONSILECTOMY, ADENOIDECTOMY, BILATERAL MYRINGOTOMY AND TUBES     Social History   Occupational History   Occupation: retired   Tobacco Use   Smoking status: Never   Smokeless tobacco: Never  Vaping Use   Vaping status: Never Used  Substance and Sexual Activity   Alcohol use: Yes    Alcohol/week: 0.0 standard drinks of alcohol    Comment: occasionally    Drug use: No   Sexual  activity: Not on file

## 2022-09-18 ENCOUNTER — Encounter: Payer: Self-pay | Admitting: Family

## 2022-09-22 ENCOUNTER — Ambulatory Visit: Payer: Medicare Other | Attending: Cardiovascular Disease | Admitting: Cardiovascular Disease

## 2022-09-22 NOTE — Progress Notes (Deleted)
Cardiology Office Note   Date:  09/22/2022   ID:  Kristen Pham, DOB [birth year], MRN [id number]  PCP:  Creola Corn, MD  Cardiologist:   Lorine Bears, MD   No chief complaint on file.     History of Present Illness: Kristen Pham is a 89 y.o. female who is here for a follow-up visit regarding exertional dyspnea, aortic stenosis and peripheral arterial disease. She has known history of essential hypertension, hyperlipidemia and arthritis.  She is not a smoker and does not have diabetes. She was seen by me in 2017 for leg pain with concerns for peripheral arterial disease. She underwent noninvasive vascular studies in 2017 which showed noncompressible vessels with moderate SFA disease bilaterally.  There was no evidence of obstructive disease at that time. She underwent cardiac evaluation in 2022 due to worsening exertional dyspnea.  Echocardiogram was done in March 2022 and showed  normal LV systolic function with mild aortic stenosis with mean gradient of 11 mmHg.  Lexiscan Myoview showed no evidence of ischemia with normal ejection fraction.  She had repeat Doppler studies in September 2023 which showed an ABI of 0.69 on the right and 0.77 on the left.  She has  deformities in her toes with some callus that is improving with spacers.  Currently with no open ulceration.  She moved to her assisted living facility 2 weeks ago.  Her functional capacity declined over the last year with some shortness of breath but no chest pain.   Past Medical History:  Diagnosis Date   Carpal tunnel syndrome    Hypertension     Past Surgical History:  Procedure Laterality Date   ABDOMINAL HYSTERECTOMY     CESAREAN SECTION     HAND SURGERY     bilateral release    TONSILECTOMY, ADENOIDECTOMY, BILATERAL MYRINGOTOMY AND TUBES       Current Outpatient Medications  Medication Sig Dispense Refill   acetaminophen (TYLENOL) 325 MG tablet Take 650 mg by mouth every 6 (six) hours as needed.      amLODipine (NORVASC) 10 MG tablet Take 10 mg by mouth daily.     aspirin EC 81 MG tablet Take 81 mg by mouth once.     atenolol (TENORMIN) 50 MG tablet Take 50 mg by mouth daily.     atorvastatin (LIPITOR) 20 MG tablet Take 1 tablet (20 mg total) by mouth daily. 90 tablet 3   calcium-vitamin D (OSCAL WITH D) 500-200 MG-UNIT per tablet Take 2 tablets by mouth.     doxycycline (VIBRA-TABS) 100 MG tablet Take 1 tablet (100 mg total) by mouth 2 (two) times daily. 60 tablet 0   gabapentin (NEURONTIN) 100 MG capsule Take 1 capsule (100 mg total) by mouth 3 (three) times daily. 90 capsule 1   ibuprofen (ADVIL) 200 MG tablet Take 200 mg by mouth every 6 (six) hours as needed.     mirabegron ER (MYRBETRIQ) 25 MG TB24 tablet Take 25 mg by mouth daily.     Misc Natural Products (T-RELIEF CBD+13 SL) Place under the tongue.     RESTASIS 0.05 % ophthalmic emulsion      Current Facility-Administered Medications  Medication Dose Route Frequency Provider Last Rate Last Admin   triamcinolone acetonide (KENALOG) 10 MG/ML injection 10 mg  10 mg Other Once Asencion Islam, DPM        Allergies:   Ivp dye [iodinated contrast media]    Social History:  The patient  reports that she has never smoked.  She has never used smokeless tobacco. She reports current alcohol use. She reports that she does not use drugs.   Family History:  The patient's family history includes Cerebral aneurysm in her father.    ROS:  Please see the history of present illness.   Otherwise, review of systems are positive for none.   All other systems are reviewed and negative.    PHYSICAL EXAM: VS:  There were no vitals taken for this visit. , BMI There is no height or weight on file to calculate BMI. GEN: Well nourished, well developed, in no acute distress  HEENT: normal  Neck: no JVD, carotid bruits, or masses Cardiac: RRR; no rubs, or gallops,no edema .2/ 6 systolic ejection murmur in the aortic area which is mid  peaking. Respiratory:  clear to auscultation bilaterally, normal work of breathing GI: soft, nontender, nondistended, + BS. No abdominal bruit MS: no deformity or atrophy  Skin: warm and dry, no rash Neuro:  Strength and sensation are intact Psych: euthymic mood, full affect Vascular: Distal pulses are not palpable.  EKG:  EKG is ordered today. EKG showed normal sinus rhythm with no significant ST or T wave changes.  Moderate LVH.   Recent Labs: No results found for requested labs within last 365 days.    Lipid Panel No results found for: "CHOL", "TRIG", "HDL", "CHOLHDL", "VLDL", "LDLCALC", "LDLDIRECT"    Wt Readings from Last 3 Encounters:  05/19/22 101 lb 3.2 oz (45.9 kg)  08/05/21 104 lb 9.6 oz (47.4 kg)  02/04/21 105 lb (47.6 kg)           No data to display            ASSESSMENT AND PLAN:  1.  Peripheral arterial disease: She has moderately reduced ABI bilaterally with abnormal distal pulses.  Currently with no claudication and no open ulceration.  Continue close observation and will only consider angiography for worsening lower extremity ischemia symptoms.  2.  Aortic stenosis: Likely in the moderate to severe range but I suspect that she is likely not a candidate for TAVR considering her age and decline in functional capacity.  3.  Essential hypertension: Blood pressure is reasonably controlled on atenolol and amlodipine.  4.  Hyperlipidemia: Given her LAD and aortic stenosis, I elected to resume atorvastatin 20 mg daily.   Disposition:   FU with me in 4 months.  Signed,  Lorine Bears, MD  09/22/2022 9:57 AM    [facility]

## 2022-09-30 ENCOUNTER — Encounter: Payer: Self-pay | Admitting: Podiatry

## 2022-09-30 ENCOUNTER — Ambulatory Visit: Payer: Medicare Other | Admitting: Podiatry

## 2022-09-30 ENCOUNTER — Other Ambulatory Visit: Payer: Self-pay | Admitting: Podiatry

## 2022-09-30 DIAGNOSIS — M869 Osteomyelitis, unspecified: Secondary | ICD-10-CM | POA: Diagnosis not present

## 2022-09-30 DIAGNOSIS — D2371 Other benign neoplasm of skin of right lower limb, including hip: Secondary | ICD-10-CM | POA: Diagnosis not present

## 2022-09-30 MED ORDER — MUPIROCIN 2 % EX OINT: 1.0000 | TOPICAL_OINTMENT | Freq: Two times a day (BID) | CUTANEOUS | 0 refills | Status: DC

## 2022-09-30 MED ORDER — DOXYCYCLINE HYCLATE 75 MG PO TBEC: 75.0000 mg | DELAYED_RELEASE_TABLET | Freq: Two times a day (BID) | ORAL | 0 refills | Status: DC

## 2022-09-30 NOTE — Progress Notes (Signed)
Subjective:  Patient ID: Kristen Pham, female    DOB: 13-Feb-1924,  MRN: [id number] HPI Chief Complaint  Patient presents with   Toe Pain    3rd toe right - 2nd opinion, seen Dr. Hulan Amato to amputate the toe, callused area, toe is dark, continues to get infected   New Patient (Initial Visit)    Est pt 2018    89 y.o. female presents with the above complaint.   ROS: She denies fever chills nausea mobic muscle aches and pains presents with her RN assistant today.  Past Medical History:  Diagnosis Date   Carpal tunnel syndrome    Hypertension    Past Surgical History:  Procedure Laterality Date   ABDOMINAL HYSTERECTOMY     CESAREAN SECTION     HAND SURGERY     bilateral release    TONSILECTOMY, ADENOIDECTOMY, BILATERAL MYRINGOTOMY AND TUBES      Current Outpatient Medications:    doxycycline (DORYX) 75 MG EC tablet, Take 1 tablet (75 mg total) by mouth 2 (two) times daily., Disp: 60 tablet, Rfl: 0   escitalopram (LEXAPRO) 5 MG tablet, Take 5 mg by mouth daily., Disp: , Rfl:    mupirocin ointment (BACTROBAN) 2 %, Apply 1 Application topically 2 (two) times daily., Disp: 22 g, Rfl: 0   acetaminophen (TYLENOL) 325 MG tablet, Take 650 mg by mouth every 6 (six) hours as needed., Disp: , Rfl:    amLODipine (NORVASC) 10 MG tablet, Take 10 mg by mouth daily., Disp: , Rfl:    aspirin EC 81 MG tablet, Take 81 mg by mouth once., Disp: , Rfl:    atenolol (TENORMIN) 50 MG tablet, Take 50 mg by mouth daily., Disp: , Rfl:    atorvastatin (LIPITOR) 20 MG tablet, Take 1 tablet (20 mg total) by mouth daily., Disp: 90 tablet, Rfl: 3   calcium-vitamin D (OSCAL WITH D) 500-200 MG-UNIT per tablet, Take 2 tablets by mouth., Disp: , Rfl:    gabapentin (NEURONTIN) 100 MG capsule, Take 1 capsule (100 mg total) by mouth 3 (three) times daily., Disp: 90 capsule, Rfl: 1   ibuprofen (ADVIL) 200 MG tablet, Take 200 mg by mouth every 6 (six) hours as needed., Disp: , Rfl:    mirabegron ER (MYRBETRIQ) 25  MG TB24 tablet, Take 25 mg by mouth daily., Disp: , Rfl:    Misc Natural Products (T-RELIEF CBD+13 SL), Place under the tongue., Disp: , Rfl:    RESTASIS 0.05 % ophthalmic emulsion, , Disp: , Rfl:   Current Facility-Administered Medications:    triamcinolone acetonide (KENALOG) 10 MG/ML injection 10 mg, 10 mg, Other, Once, Stover, Titorya, DPM  Allergies  Allergen Reactions   Ivp Dye [Iodinated Contrast Media]     Felt confused, disoriented   Review of Systems Objective:  There were no vitals filed for this visit.  General: Well developed, nourished, in no acute distress, alert and oriented x3   Dermatological: Skin is warm, dry and supple bilateral. Nails x 10 are well maintained; remaining integument appears unremarkable at this time. There are no open sores, no preulcerative lesions, no rash or signs of infection present.  Ulcerative lesion measuring 0.1 cm in diameter distal medial aspect of the third digit overlying the DIPJ.  Vascular: Dorsalis Pedis artery and Posterior Tibial artery pedal pulses are 0/4 bilateral with immedate capillary fill time. Pedal hair growth present. No varicosities and no lower extremity edema present bilateral.   Neruologic: Grossly intact via light touch bilateral. Vibratory intact via tuning fork  bilateral. Protective threshold with Semmes Wienstein monofilament intact to all pedal sites bilateral. Patellar and Achilles deep tendon reflexes 2+ bilateral. No Babinski or clonus noted bilateral.   Musculoskeletal: No gross boney pedal deformities bilateral. No pain, crepitus, or limitation noted with foot and ankle range of motion bilateral. Muscular strength 5/5 in all groups tested bilateral.  Third toe right foot is slightly darkened with a small scab over the distal medial aspect of the DIPJ area.  This was debrided revealing an open wound with small amount of purulence.  The toe is moderately tender.  Bone is probable.  Gait: Unassisted, Nonantalgic.     Radiographs:  None taken  Assessment & Plan:   Assessment: Most likely osteomyelitis.  Plan: Debrided the wound today Ragona stay on doxycycline keep the wound dressed and covered so that there is no further exposure.  Very prescription for not only doxycycline but Bactroban ointment.  Unguinal follow-up with her in 1 month we will watch her carefully to make sure she does not need amputation I am concerned about amputation because of the lack of pulses.     Kristen Pham, [place]

## 2022-10-01 ENCOUNTER — Other Ambulatory Visit: Payer: Self-pay | Admitting: Podiatry

## 2022-10-01 ENCOUNTER — Encounter: Payer: Self-pay | Admitting: Physical Medicine & Rehabilitation

## 2022-10-01 DIAGNOSIS — M869 Osteomyelitis, unspecified: Secondary | ICD-10-CM

## 2022-10-07 ENCOUNTER — Ambulatory Visit: Payer: Medicare Other | Admitting: Family

## 2022-10-07 DIAGNOSIS — G8929 Other chronic pain: Secondary | ICD-10-CM

## 2022-10-07 DIAGNOSIS — M205X2 Other deformities of toe(s) (acquired), left foot: Secondary | ICD-10-CM | POA: Diagnosis not present

## 2022-10-07 DIAGNOSIS — M79675 Pain in left toe(s): Secondary | ICD-10-CM | POA: Diagnosis not present

## 2022-10-09 ENCOUNTER — Encounter: Payer: Self-pay | Admitting: Family

## 2022-10-09 MED ORDER — DOXYCYCLINE HYCLATE 50 MG PO CAPS: 100.0000 mg | ORAL_CAPSULE | Freq: Two times a day (BID) | ORAL | 0 refills | Status: DC

## 2022-10-09 NOTE — Progress Notes (Signed)
Office Visit Note   Patient: Kristen Pham           Date of Birth: 13-Dec-1924           MRN: [id number] Visit Date: 10/07/2022              Requested by: Creola Corn, MD [address] [place],  [place] [address] PCP: Creola Corn, MD  Chief Complaint  Patient presents with   Right Foot - Follow-up      HPI: The patient is a 89 year old woman who is seen in follow-up for ulceration and pain to the right third toe she has been having issues with this for quite some time ischemic pain pattern she has had debridement in our office she is also been following with podiatry when she had difficulty getting a sooner appointment with [place].  She is currently on a course of doxycycline when she completes this she will have completed 4 weeks of doxycycline she is pleased with the improvement in her ulcer however she continues to have pain which has not changed  Known osteomyelitis in the toe but patient has been hesitant to undergo amputation  She is current using gabapentin and tramadol for her pain with mild to moderate relief  Assessment & Plan: Visit Diagnoses: No diagnosis found.  Plan: Discussed options with patient and family she would like to continue to pursue conservative measures discussed return precautions will occur place her on a course of 50 mg doxycycline for chronic management of her osteomyelitis she will continue with her current wound care  Follow-Up Instructions: No follow-ups on file.   Ortho Exam  Patient is alert, oriented, no adenopathy, well-dressed, normal affect, normal respiratory effort. On examination of the right foot her foot is without erythema or edema of the third toe ulcer today has epithelialized there is some darkened discoloration and edema however there is no sausage digit swelling there is diffuse tenderness there is no gangrene  Imaging: No results found. No images are attached to the encounter.  Labs: Lab Results  Component Value Date    HGBA1C 5.9 (H) 05/15/2014   ESRSEDRATE 61 (H) 12/02/2009   CRP 11.9 (H) 12/02/2009     No results found for: "ALBUMIN", "PREALBUMIN", "CBC"  No results found for: "MG" No results found for: "VD25OH"  No results found for: "PREALBUMIN"    Latest Ref Rng & Units 04/04/2016    2:11 PM 12/02/2009    9:50 PM 07/24/2009    8:36 PM  CBC EXTENDED  WBC 4.0 - 10.5 K/uL 5.9  10.6    RBC 3.87 - 5.11 MIL/uL 4.58  4.05    Hemoglobin 12.0 - 15.0 g/dL 19.1  47.8  29.5   HCT 36.0 - 46.0 % 41.9  36.9  47.0   Platelets 150 - 400 K/uL 231  306    NEUT# 1.7 - 7.7 K/uL 4.1  6.4    Lymph# 0.7 - 4.0 K/uL 1.3  2.3       There is no height or weight on file to calculate BMI.  Orders:  No orders of the defined types were placed in this encounter.  Meds ordered this encounter  Medications   doxycycline (VIBRAMYCIN) 50 MG capsule    Sig: Take 2 capsules (100 mg total) by mouth 2 (two) times daily.    Dispense:  60 capsule    Refill:  0     Procedures: No procedures performed  Clinical Data: No additional findings.  ROS:  All  other systems negative, except as noted in the HPI. Review of Systems  Objective: Vital Signs: There were no vitals taken for this visit.  Specialty Comments:  No specialty comments available.  PMFS History: Patient Active Problem List   Diagnosis Date Noted   PAD (peripheral artery disease) (HCC) 04/21/2022   Bilateral pseudophakia 02/17/2021   Corneal edema of right eye 02/17/2021   Neuralgia 11/07/2018   Sensorineural hearing loss (SNHL) of both ears 10/12/2018   Non-pressure chronic ulcer of other part of left foot limited to breakdown of skin (HCC) 01/05/2017   Acquired claw toe, left 10/07/2016   Hard corn 10/07/2016   Chronic toe pain, left foot 08/25/2016   Meniscal cyst 08/23/2012   Knee pain, bilateral 06/21/2012   Primary osteoarthritis of both knees 06/21/2012   Generalized osteoarthritis of hand 06/21/2012   Past Medical History:   Diagnosis Date   Carpal tunnel syndrome    Hypertension     Family History  Problem Relation Age of Onset   Cerebral aneurysm Father     Past Surgical History:  Procedure Laterality Date   ABDOMINAL HYSTERECTOMY     CESAREAN SECTION     HAND SURGERY     bilateral release    TONSILECTOMY, ADENOIDECTOMY, BILATERAL MYRINGOTOMY AND TUBES     Social History   Occupational History   Occupation: retired   Tobacco Use   Smoking status: Never   Smokeless tobacco: Never  Vaping Use   Vaping status: Never Used  Substance and Sexual Activity   Alcohol use: Yes    Alcohol/week: 0.0 standard drinks of alcohol    Comment: occasionally    Drug use: No   Sexual activity: Not on file

## 2022-10-15 ENCOUNTER — Encounter: Payer: Medicare Other | Attending: Physical Medicine & Rehabilitation | Admitting: Physical Medicine & Rehabilitation

## 2022-10-15 ENCOUNTER — Encounter: Payer: Self-pay | Admitting: Physical Medicine & Rehabilitation

## 2022-10-15 VITALS — BP 142/66 | HR 68 | Ht 59.0 in | Wt 103.0 lb

## 2022-10-15 DIAGNOSIS — G894 Chronic pain syndrome: Secondary | ICD-10-CM | POA: Insufficient documentation

## 2022-10-15 DIAGNOSIS — M79671 Pain in right foot: Secondary | ICD-10-CM | POA: Diagnosis present

## 2022-10-15 DIAGNOSIS — Z79891 Long term (current) use of opiate analgesic: Secondary | ICD-10-CM | POA: Diagnosis present

## 2022-10-15 DIAGNOSIS — Z5181 Encounter for therapeutic drug level monitoring: Secondary | ICD-10-CM | POA: Insufficient documentation

## 2022-10-21 LAB — TOXASSURE SELECT,+ANTIDEPR,UR

## 2022-10-27 ENCOUNTER — Ambulatory Visit: Payer: Medicare Other | Admitting: Podiatry

## 2022-10-28 ENCOUNTER — Ambulatory Visit: Payer: Medicare Other | Admitting: Family

## 2022-10-28 ENCOUNTER — Encounter: Payer: Self-pay | Admitting: Family

## 2022-10-28 DIAGNOSIS — L97521 Non-pressure chronic ulcer of other part of left foot limited to breakdown of skin: Secondary | ICD-10-CM | POA: Diagnosis not present

## 2022-10-28 DIAGNOSIS — M79675 Pain in left toe(s): Secondary | ICD-10-CM

## 2022-10-28 DIAGNOSIS — G8929 Other chronic pain: Secondary | ICD-10-CM | POA: Diagnosis not present

## 2022-10-28 DIAGNOSIS — M205X2 Other deformities of toe(s) (acquired), left foot: Secondary | ICD-10-CM | POA: Diagnosis not present

## 2022-10-28 NOTE — Progress Notes (Signed)
Office Visit Note   Patient: Kristen Pham           Date of Birth: 16-Feb-1924           MRN: [id number] Visit Date: 10/28/2022              Requested by: Creola Corn, MD [address] [place],  [place] [address] PCP: Creola Corn, MD  Chief Complaint  Patient presents with   Right Foot - Follow-up      HPI: The patient is a 89 year old woman who is seen in follow-up for ulceration and pain to the right third toe she has been having issues with this for quite some time ischemic pain pattern she has had debridement in our office she is also been following with podiatry to her convenience.  Today is much more comfortable.  Her toe pain has lessened some.  She is requesting debridement of the hyperkeratotic tissue to the third toe.  She is accompanied by a patient advocate as well as a nurse from her facility.  Physical therapy have recommended stiff supportive walking shoes which she is wearing today.   She is currently using gabapentin and tramadol at bedtime with good relief.  Assessment & Plan: Visit Diagnoses: No diagnosis found.  Plan: Has improvement in her pain.  As well as her mobility.  She will resume regular shoewear her supportive new balance for walking.  Follow-up with [place] as needed for debridement.   Follow-Up Instructions: Return if symptoms worsen or fail to improve.   Ortho Exam  Patient is alert, oriented, no adenopathy, well-dressed, normal affect, normal respiratory effort. On examination of the right foot her foot is without erythema or edema of the third toe ulcer today mild darkened discoloration to the third toe there is some hyperkeratotic tissue callus buildup to the medial border this is debrided with a 10 blade knife back to viable tissue after informed consent.  Patient voiced some relief of her pain.  No drainage or open area   Imaging: No results found. No images are attached to the encounter.  Labs: Lab Results  Component Value Date    HGBA1C 5.9 (H) 05/15/2014   ESRSEDRATE 61 (H) 12/02/2009   CRP 11.9 (H) 12/02/2009     No results found for: "ALBUMIN", "PREALBUMIN", "CBC"  No results found for: "MG" No results found for: "VD25OH"  No results found for: "PREALBUMIN"    Latest Ref Rng & Units 04/04/2016    2:11 PM 12/02/2009    9:50 PM 07/24/2009    8:36 PM  CBC EXTENDED  WBC 4.0 - 10.5 K/uL 5.9  10.6    RBC 3.87 - 5.11 MIL/uL 4.58  4.05    Hemoglobin 12.0 - 15.0 g/dL 27.2  53.6  64.4   HCT 36.0 - 46.0 % 41.9  36.9  47.0   Platelets 150 - 400 K/uL 231  306    NEUT# 1.7 - 7.7 K/uL 4.1  6.4    Lymph# 0.7 - 4.0 K/uL 1.3  2.3       There is no height or weight on file to calculate BMI.  Orders:  No orders of the defined types were placed in this encounter.  No orders of the defined types were placed in this encounter.    Procedures: No procedures performed  Clinical Data: No additional findings.  ROS:  All other systems negative, except as noted in the HPI. Review of Systems  Objective: Vital Signs: There were no vitals taken for  this visit.  Specialty Comments:  No specialty comments available.  PMFS History: Patient Active Problem List   Diagnosis Date Noted   PAD (peripheral artery disease) (HCC) 04/21/2022   Bilateral pseudophakia 02/17/2021   Corneal edema of right eye 02/17/2021   Neuralgia 11/07/2018   Sensorineural hearing loss (SNHL) of both ears 10/12/2018   Non-pressure chronic ulcer of other part of left foot limited to breakdown of skin (HCC) 01/05/2017   Acquired claw toe, left 10/07/2016   Hard corn 10/07/2016   Chronic toe pain, left foot 08/25/2016   Meniscal cyst 08/23/2012   Knee pain, bilateral 06/21/2012   Primary osteoarthritis of both knees 06/21/2012   Generalized osteoarthritis of hand 06/21/2012   Past Medical History:  Diagnosis Date   Carpal tunnel syndrome    Hypertension     Family History  Problem Relation Age of Onset   Cerebral aneurysm Father      Past Surgical History:  Procedure Laterality Date   ABDOMINAL HYSTERECTOMY     CESAREAN SECTION     HAND SURGERY     bilateral release    TONSILECTOMY, ADENOIDECTOMY, BILATERAL MYRINGOTOMY AND TUBES     Social History   Occupational History   Occupation: retired   Tobacco Use   Smoking status: Never   Smokeless tobacco: Never  Vaping Use   Vaping status: Never Used  Substance and Sexual Activity   Alcohol use: Yes    Alcohol/week: 0.0 standard drinks of alcohol    Comment: occasionally    Drug use: No   Sexual activity: Not on file

## 2022-10-29 ENCOUNTER — Ambulatory Visit: Payer: Medicare Other | Admitting: Podiatry

## 2022-10-30 ENCOUNTER — Encounter: Payer: Medicare Other | Admitting: Physical Medicine & Rehabilitation

## 2022-11-03 ENCOUNTER — Encounter: Payer: Medicare Other | Admitting: Physical Medicine & Rehabilitation

## 2022-11-11 ENCOUNTER — Telehealth: Payer: Self-pay | Admitting: Physical Medicine & Rehabilitation

## 2022-11-24 ENCOUNTER — Telehealth: Payer: Self-pay | Admitting: Family

## 2022-11-24 ENCOUNTER — Telehealth: Payer: Self-pay | Admitting: Orthopedic Surgery

## 2022-11-24 NOTE — Telephone Encounter (Signed)
Can you see if she can come in Thursday afternoon this week? Let me know a time. Thank you.

## 2022-11-24 NOTE — Telephone Encounter (Signed)
Pt's daughter called starting pt experiencing lots of pain in her toe. Made appt for 11/12. Also added to cancellation list. Asking fir sooner appt if possible. Phone number is [phone].

## 2022-11-26 ENCOUNTER — Ambulatory Visit: Payer: Medicare Other | Admitting: Orthopedic Surgery

## 2022-11-26 ENCOUNTER — Ambulatory Visit: Payer: Medicare Other | Admitting: Podiatry

## 2022-11-26 ENCOUNTER — Encounter: Payer: Self-pay | Admitting: Orthopedic Surgery

## 2022-11-26 DIAGNOSIS — M6701 Short Achilles tendon (acquired), right ankle: Secondary | ICD-10-CM | POA: Diagnosis not present

## 2022-11-26 DIAGNOSIS — M79671 Pain in right foot: Secondary | ICD-10-CM | POA: Diagnosis not present

## 2022-11-26 MED ORDER — GABAPENTIN 100 MG PO CAPS: 100.0000 mg | ORAL_CAPSULE | Freq: Three times a day (TID) | ORAL | 1 refills | Status: DC

## 2022-11-26 NOTE — Progress Notes (Signed)
Office Visit Note   Patient: Kristen Pham           Date of Birth: 01-18-1925           MRN: [id number] Visit Date: 11/26/2022              Requested by: Creola Corn, MD [address] [place],  [place] [address] PCP: Creola Corn, MD  Chief Complaint  Patient presents with   Right Foot - Follow-up      HPI: Patient is a 89 year old woman who presents with Mccormick corn dorsal PIP joint of the third toe right foot and complaining of increasing burning pain beneath the metatarsal heads on the right.  Assessment & Plan: Visit Diagnoses:  1. Pain in right foot   2. Achilles tendon contracture, right     Plan: A felt orthotic was placed to provide more pressure under the arch of her foot to unload the forefoot.  Refill prescription for Neurontin.  Callus pared right foot third toe.  Follow-Up Instructions: Return if symptoms worsen or fail to improve.   Ortho Exam  Patient is alert, oriented, no adenopathy, well-dressed, normal affect, normal respiratory effort. Examination of the foot patient have equinus contracture with dorsiflexion 20 degrees short of neutral.  She has pain to palpation generalized beneath the metatarsal heads of the right foot.  There is no cellulitis no ulcers.  Patient does have a hyperkeratotic lesion dorsal to the PIP joint third toe and this was pared without complications.  Imaging: No results found. No images are attached to the encounter.  Labs: Lab Results  Component Value Date   HGBA1C 5.9 (H) 05/15/2014   ESRSEDRATE 61 (H) 12/02/2009   CRP 11.9 (H) 12/02/2009     No results found for: "ALBUMIN", "PREALBUMIN", "CBC"  No results found for: "MG" No results found for: "VD25OH"  No results found for: "PREALBUMIN"    Latest Ref Rng & Units 04/04/2016    2:11 PM 12/02/2009    9:50 PM 07/24/2009    8:36 PM  CBC EXTENDED  WBC 4.0 - 10.5 K/uL 5.9  10.6    RBC 3.87 - 5.11 MIL/uL 4.58  4.05    Hemoglobin 12.0 - 15.0 g/dL 19.1  47.8   29.5   HCT 36.0 - 46.0 % 41.9  36.9  47.0   Platelets 150 - 400 K/uL 231  306    NEUT# 1.7 - 7.7 K/uL 4.1  6.4    Lymph# 0.7 - 4.0 K/uL 1.3  2.3       There is no height or weight on file to calculate BMI.  Orders:  No orders of the defined types were placed in this encounter.  No orders of the defined types were placed in this encounter.    Procedures: No procedures performed  Clinical Data: No additional findings.  ROS:  All other systems negative, except as noted in the HPI. Review of Systems  Objective: Vital Signs: There were no vitals taken for this visit.  Specialty Comments:  No specialty comments available.  PMFS History: Patient Active Problem List   Diagnosis Date Noted   PAD (peripheral artery disease) (HCC) 04/21/2022   Bilateral pseudophakia 02/17/2021   Corneal edema of right eye 02/17/2021   Neuralgia 11/07/2018   Sensorineural hearing loss (SNHL) of both ears 10/12/2018   Non-pressure chronic ulcer of other part of left foot limited to breakdown of skin (HCC) 01/05/2017   Acquired claw toe, left 10/07/2016   Hard corn 10/07/2016  Chronic toe pain, left foot 08/25/2016   Meniscal cyst 08/23/2012   Knee pain, bilateral 06/21/2012   Primary osteoarthritis of both knees 06/21/2012   Generalized osteoarthritis of hand 06/21/2012   Past Medical History:  Diagnosis Date   Carpal tunnel syndrome    Hypertension     Family History  Problem Relation Age of Onset   Cerebral aneurysm Father     Past Surgical History:  Procedure Laterality Date   ABDOMINAL HYSTERECTOMY     CESAREAN SECTION     HAND SURGERY     bilateral release    TONSILECTOMY, ADENOIDECTOMY, BILATERAL MYRINGOTOMY AND TUBES     Social History   Occupational History   Occupation: retired   Tobacco Use   Smoking status: Never   Smokeless tobacco: Never  Vaping Use   Vaping status: Never Used  Substance and Sexual Activity   Alcohol use: Yes    Alcohol/week: 0.0 standard  drinks of alcohol    Comment: occasionally    Drug use: No   Sexual activity: Not on file

## 2022-12-01 ENCOUNTER — Ambulatory Visit: Payer: Medicare Other | Admitting: Family

## 2022-12-14 ENCOUNTER — Encounter: Payer: Self-pay | Admitting: Physical Medicine & Rehabilitation

## 2022-12-14 ENCOUNTER — Encounter: Payer: Medicare Other | Attending: Physical Medicine & Rehabilitation | Admitting: Physical Medicine & Rehabilitation

## 2022-12-14 VITALS — BP 145/60 | HR 78 | Ht 59.0 in | Wt 110.0 lb

## 2022-12-14 DIAGNOSIS — M79671 Pain in right foot: Secondary | ICD-10-CM

## 2022-12-14 DIAGNOSIS — Z79891 Long term (current) use of opiate analgesic: Secondary | ICD-10-CM | POA: Diagnosis present

## 2022-12-14 DIAGNOSIS — G894 Chronic pain syndrome: Secondary | ICD-10-CM | POA: Diagnosis not present

## 2022-12-14 MED ORDER — GABAPENTIN 100 MG PO CAPS: 100.0000 mg | ORAL_CAPSULE | Freq: Three times a day (TID) | ORAL | 1 refills | Status: DC

## 2022-12-14 MED ORDER — TRAMADOL HCL 50 MG PO TABS: 50.0000 mg | ORAL_TABLET | Freq: Four times a day (QID) | ORAL | 0 refills | Status: DC | PRN

## 2022-12-14 NOTE — Progress Notes (Signed)
Subjective:    Patient ID: Kristen Pham, female    DOB: 1924-03-21, 89 y.o.   MRN: [id number]  HPI   HPI     Cathay Pham is a 89 y.o. year old female  who  has a past medical history of Carpal tunnel syndrome and Hypertension.   They are presenting to PM&R clinic as a new patient for pain management evaluation. They were referred by by Dr. Timothy Lasso for treatment of pain in her toe, CPRS history pain.  She is here with her daughter who assist with history.  She has had pain for many years related to osteoarthritis that is in multiple joints throughout her body.  OA is most significant in her knees.  She has had cortisone, Supartz injections for her knees in the past.  She has also had right knee nerve ablation.  She had a Kenalog injection in her hip.  She also has pain in her shoulders.  She would develop pain in her right third toe after she had a corn removed in 2018.  This resulted in an infection.  She was seen by Dr. Eual Fines but told she did not need a amputation at this location.  Daughter reports that she has complained about pain throughout her legs.  She will sometimes report that the pain in her toe will radiate throughout her legs.  daughter reports that she will often have pain exacerbations and that sometimes provides varying descriptions of her pain.  Pain has generally been better for the past week or two.     Patient reports that today her pain is the worst in the third toe on the right.  This is worsened with ambulation.  Pain is tingling and stabbing.   She uses CBD cream for pain that will sometimes occur on the left ball of her foot.   Pain is improved with tramadol as needed and gabapentin 100 mg 3 times daily.   She has been recently treated with doxycycline for possible infection in her toe.   She has previously been seen by pain management.  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   Corn removed from R foot 2018. Resulted  in an infection. Seen by Dr. Lajoyce Corners- no ampulation needed.  Radiating pain up her leg.  Pain throughout the legs     Red flag symptoms: No red flags for back pain endorsed in Hx or ROS   Medications tried: Topical medications  CBD cream , roll on anesthetic, voltaren gel, Frankincense and myrhh  Nsaids Advil- helps  Tylenol- Helps a little  Opiates  Tramadol- helps pain Gabapentin - gabapentin helps pain Lyrica- possibly had side effect, doesn't remember      Other treatments: PT- Completed few weeks ago  TENs unit denies use Injections right knee cortisone, Supartz, right knee nerve ablation Right hip cortisone injection    Interval history 12/14/22 Patient is here for follow-up regarding her chronic pain.  Greatest pain is in her right knee and right third toe.  She continues to report pain deep to her toe that has multiple categories of pain such as burning, stabbing, aching.  Overall it sounds that pain has been a little better since her last visit.  She continues to use Tylenol and Advil as needed.  She also uses gabapentin 100 mg 2-3 times a day which has also been helping to reduce her pain.  Pain continues to be limiting and some days she wants to  not be active due to the pain.  She does also have a lot of pain in her right knee.  Prior gel injections, cortisone injections, nerve ablation.  Steroid injections helped but not for as long as she would like them to help.   Pain Inventory Average Pain 7 Pain Right Now 7 My pain is sharp, burning, dull, tingling, and aching  In the last 24 hours, has pain interfered with the following? General activity 5 Relation with others 1 Enjoyment of life 5 What TIME of day is your pain at its worst? varies Sleep (in general) Fair  Pain is worse with: walking and standing Pain improves with: heat/ice and medication Relief from Meds: 5  Family History  Problem Relation Age of Onset   Cerebral aneurysm Father    Social History    Socioeconomic History   Marital status: Widowed    Spouse name: Not on file   Number of children: 1   Years of education: associates   Highest education level: Not on file  Occupational History   Occupation: retired   Tobacco Use   Smoking status: Never   Smokeless tobacco: Never  Vaping Use   Vaping status: Never Used  Substance and Sexual Activity   Alcohol use: Yes    Alcohol/week: 0.0 standard drinks of alcohol    Comment: occasionally    Drug use: No   Sexual activity: Not on file  Other Topics Concern   Not on file  Social History Narrative   Lives at home alone   Drinks coke and coffee occasionally    Two story home   Right handed   Social Determinants of Health   Financial Resource Strain: Not on file  Food Insecurity: No Food Insecurity (10/08/2021)   Received from [facility], [facility]   Hunger Vital Sign    Worried About Running Out of Food in the Last Year: Never true    Pham Out of Food in the Last Year: Never true  Transportation Needs: Not on file  Physical Activity: Not on file  Stress: Not on file  Social Connections: Unknown (09/30/2021)   Received from [facility], [facility]   Social Network    Social Network: Not on file   Past Surgical History:  Procedure Laterality Date   ABDOMINAL HYSTERECTOMY     CESAREAN SECTION     HAND SURGERY     bilateral release    TONSILECTOMY, ADENOIDECTOMY, BILATERAL MYRINGOTOMY AND TUBES     Past Surgical History:  Procedure Laterality Date   ABDOMINAL HYSTERECTOMY     CESAREAN SECTION     HAND SURGERY     bilateral release    TONSILECTOMY, ADENOIDECTOMY, BILATERAL MYRINGOTOMY AND TUBES     Past Medical History:  Diagnosis Date   Carpal tunnel syndrome    Hypertension    BP (!) 145/60   Pulse 78   Ht 4\' 11"  (1.499 m)   Wt 110 lb (49.9 kg)   SpO2 (!) 88%   BMI 22.22 kg/m   Opioid Risk Score:   Fall Risk Score:  `1  Depression screen [facility] 2/9     12/14/2022   11:28 AM 10/15/2022    10:29 AM 08/01/2015   10:23 AM 08/01/2015   10:08 AM 02/07/2014    9:49 AM  Depression screen PHQ 2/9  Decreased Interest 0 2 0 0 0  Down, Depressed, Hopeless 0 3 0 0 0  PHQ - 2 Score 0 5 0 0 0  Altered sleeping  3     Tired, decreased energy  3     Change in appetite  1     Feeling bad or failure about yourself   3     Trouble concentrating  2     Moving slowly or fidgety/restless  0     Suicidal thoughts  1     PHQ-9 Score  18     Difficult doing work/chores  Very difficult         Review of Systems  Musculoskeletal:  Positive for gait problem.  All other systems reviewed and are negative.     Objective:   Physical Exam  Gen: no distress, normal appearing, appears younger than stated age HEENT: oral mucosa pink and moist, NCAT Chest: normal effort, normal rate of breathing Abd: soft, non-distended Ext: no edema Psych: Very pleasant Skin: intact Neuro: Alert and awake, follows commands, cranial nerves II through XII grossly intact Limited ability to describe her pain and prior treatments--daughter provides assistance Strength  4 to 4+ out of 5 throughout bilateral upper lower extremities Sensation intact light touch in all 4 extremities  Musculoskeletal:  Mild shoulder pain with passive range of motion PROM decreased right shoulder to about 90 degrees, close to full on the left- not checked today Right third toe with callus area- mild tenderness to palpation proximal to this area Bilateral knee joint line tenderness right greater than left SLR negative -left hip pain reported during maneuver Arthritic changes in bilateral hands            R shoulder xray 03/06/22 IMPRESSION: 1. No fracture or dislocation of the right shoulder. 2. Severe acromioclavicular and glenohumeral arthrosis. 3. High riding position of the humeral head, suggesting chronic rotator cuff tear.   R knee xray 03/06/22 FINDINGS: Tricompartmental degenerative changes of the right knee joint  are seen. No joint effusion is noted. No acute fracture or dislocation is seen. Vascular calcifications are noted.   IMPRESSION: Degenerative change without acute abnormality.         Assessment & Plan:  1) Right third toe pain with history of osteomyelitis.  She is on doxycycline per orthopedics.   She also plans to follow-up with Dr. Eual Fines to discuss possibility of amputation. 2) Peripheral arterial disease 3) Osteoarthritis right hip, bilateral knees right greater than left 4) Right shoulder osteoarthritis 5) Prior history of CPRS/neuropathy.  Patient is limited historian, unable to confirm diagnosis however will continue to monitor    Plan -Pain agreement prior visit -Tramadol 50 mg Q6h PRN ordered -Increase gabapentin to 100-200 mg 3 times daily -UDS with THC-suspect this related to CBD product that she is taking as needed.  She says she will discontinue use. -Pain Journal -Recently completed physical therapy with benefit reported for her function -TENS unit, zynex nexwave was ordered last visit- try for R knee advised -Caution to change medications as she is sensitive to medications in regards to cognition -Duloxetine may be an option to try at a later time -Will schedule Zilretta injection next visit, last cortisone injection report 09/09/22, check insurance coverage

## 2022-12-18 ENCOUNTER — Other Ambulatory Visit: Payer: Self-pay

## 2022-12-18 ENCOUNTER — Emergency Department (HOSPITAL_BASED_OUTPATIENT_CLINIC_OR_DEPARTMENT_OTHER): Payer: Medicare Other

## 2022-12-18 ENCOUNTER — Inpatient Hospital Stay (HOSPITAL_BASED_OUTPATIENT_CLINIC_OR_DEPARTMENT_OTHER)
Admission: EM | Admit: 2022-12-18 | Discharge: 2022-12-23 | DRG: 291 | Disposition: A | Discharge status: Skilled Nursing Facility | Payer: Medicare Other | Attending: Internal Medicine | Admitting: Internal Medicine

## 2022-12-18 ENCOUNTER — Encounter (HOSPITAL_BASED_OUTPATIENT_CLINIC_OR_DEPARTMENT_OTHER): Payer: Self-pay | Admitting: Emergency Medicine

## 2022-12-18 DIAGNOSIS — G8929 Other chronic pain: Secondary | ICD-10-CM | POA: Diagnosis not present

## 2022-12-18 DIAGNOSIS — I739 Peripheral vascular disease, unspecified: Secondary | ICD-10-CM | POA: Diagnosis present

## 2022-12-18 DIAGNOSIS — J9601 Acute respiratory failure with hypoxia: Secondary | ICD-10-CM | POA: Diagnosis present

## 2022-12-18 DIAGNOSIS — M79675 Pain in left toe(s): Secondary | ICD-10-CM | POA: Diagnosis present

## 2022-12-18 DIAGNOSIS — I509 Heart failure, unspecified: Principal | ICD-10-CM

## 2022-12-18 DIAGNOSIS — I5031 Acute diastolic (congestive) heart failure: Secondary | ICD-10-CM

## 2022-12-18 DIAGNOSIS — I5033 Acute on chronic diastolic (congestive) heart failure: Secondary | ICD-10-CM | POA: Diagnosis present

## 2022-12-18 DIAGNOSIS — D649 Anemia, unspecified: Secondary | ICD-10-CM | POA: Diagnosis present

## 2022-12-18 DIAGNOSIS — Z91041 Radiographic dye allergy status: Secondary | ICD-10-CM | POA: Diagnosis not present

## 2022-12-18 DIAGNOSIS — D509 Iron deficiency anemia, unspecified: Secondary | ICD-10-CM | POA: Diagnosis present

## 2022-12-18 DIAGNOSIS — Z79899 Other long term (current) drug therapy: Secondary | ICD-10-CM | POA: Diagnosis not present

## 2022-12-18 DIAGNOSIS — E785 Hyperlipidemia, unspecified: Secondary | ICD-10-CM | POA: Diagnosis present

## 2022-12-18 DIAGNOSIS — I130 Hypertensive heart and chronic kidney disease with heart failure and stage 1 through stage 4 chronic kidney disease, or unspecified chronic kidney disease: Secondary | ICD-10-CM | POA: Diagnosis present

## 2022-12-18 DIAGNOSIS — N3281 Overactive bladder: Secondary | ICD-10-CM | POA: Diagnosis present

## 2022-12-18 DIAGNOSIS — Z1152 Encounter for screening for COVID-19: Secondary | ICD-10-CM | POA: Diagnosis not present

## 2022-12-18 DIAGNOSIS — N1831 Chronic kidney disease, stage 3a: Secondary | ICD-10-CM | POA: Diagnosis present

## 2022-12-18 DIAGNOSIS — I10 Essential (primary) hypertension: Secondary | ICD-10-CM | POA: Diagnosis not present

## 2022-12-18 DIAGNOSIS — J9600 Acute respiratory failure, unspecified whether with hypoxia or hypercapnia: Secondary | ICD-10-CM | POA: Insufficient documentation

## 2022-12-18 DIAGNOSIS — D72829 Elevated white blood cell count, unspecified: Secondary | ICD-10-CM | POA: Diagnosis present

## 2022-12-18 DIAGNOSIS — F418 Other specified anxiety disorders: Secondary | ICD-10-CM | POA: Diagnosis present

## 2022-12-18 DIAGNOSIS — G9349 Other encephalopathy: Secondary | ICD-10-CM | POA: Diagnosis present

## 2022-12-18 DIAGNOSIS — I350 Nonrheumatic aortic (valve) stenosis: Secondary | ICD-10-CM | POA: Diagnosis present

## 2022-12-18 DIAGNOSIS — Z90710 Acquired absence of both cervix and uterus: Secondary | ICD-10-CM | POA: Diagnosis not present

## 2022-12-18 LAB — CBC
HCT: 33.3 % — ABNORMAL LOW (ref 36.0–46.0)
HCT: 34.3 % — ABNORMAL LOW (ref 36.0–46.0)
Hemoglobin: 11.1 g/dL — ABNORMAL LOW (ref 12.0–15.0)
Hemoglobin: 11.2 g/dL — ABNORMAL LOW (ref 12.0–15.0)
MCH: 30.2 pg (ref 26.0–34.0)
MCH: 29.4 pg (ref 26.0–34.0)
MCHC: 33.3 g/dL (ref 30.0–36.0)
MCHC: 32.7 g/dL (ref 30.0–36.0)
MCV: 90.5 fL (ref 80.0–100.0)
MCV: 90 fL (ref 80.0–100.0)
Platelets: 287 10*3/uL (ref 150–400)
Platelets: 305 10*3/uL (ref 150–400)
RBC: 3.68 MIL/uL — ABNORMAL LOW (ref 3.87–5.11)
RBC: 3.81 MIL/uL — ABNORMAL LOW (ref 3.87–5.11)
RDW: 13.3 % (ref 11.5–15.5)
RDW: 13.2 % (ref 11.5–15.5)
WBC: 9 10*3/uL (ref 4.0–10.5)
WBC: 9.4 10*3/uL (ref 4.0–10.5)
nRBC: 0 % (ref 0.0–0.2)
nRBC: 0 % (ref 0.0–0.2)

## 2022-12-18 LAB — RESP PANEL BY RT-PCR (RSV, FLU A&B, COVID)  RVPGX2
Influenza A by PCR: NEGATIVE
Influenza B by PCR: NEGATIVE
Resp Syncytial Virus by PCR: NEGATIVE
SARS Coronavirus 2 by RT PCR: NEGATIVE

## 2022-12-18 LAB — COMPREHENSIVE METABOLIC PANEL
ALT: 29 U/L (ref 0–44)
AST: 23 U/L (ref 15–41)
Albumin: 4 g/dL (ref 3.5–5.0)
Alkaline Phosphatase: 88 U/L (ref 38–126)
Anion gap: 9 (ref 5–15)
BUN: 25 mg/dL — ABNORMAL HIGH (ref 8–23)
CO2: 26 mmol/L (ref 22–32)
Calcium: 9.3 mg/dL (ref 8.9–10.3)
Chloride: 102 mmol/L (ref 98–111)
Creatinine, Ser: 0.96 mg/dL (ref 0.44–1.00)
GFR, Estimated: 54 mL/min — ABNORMAL LOW (ref >60)
Glucose, Bld: 113 mg/dL — ABNORMAL HIGH (ref 70–99)
Potassium: 4 mmol/L (ref 3.5–5.1)
Sodium: 137 mmol/L (ref 135–145)
Total Bilirubin: 1.1 mg/dL (ref <1.2)
Total Protein: 7.2 g/dL (ref 6.5–8.1)

## 2022-12-18 LAB — BRAIN NATRIURETIC PEPTIDE: B Natriuretic Peptide: 739.3 pg/mL — ABNORMAL HIGH (ref 0.0–100.0)

## 2022-12-18 LAB — CREATININE, SERUM
Creatinine, Ser: 0.87 mg/dL (ref 0.44–1.00)
GFR, Estimated: >60 mL/min (ref >60)

## 2022-12-18 LAB — TROPONIN I (HIGH SENSITIVITY)
Troponin I (High Sensitivity): 21 ng/L — ABNORMAL HIGH (ref <18)
Troponin I (High Sensitivity): 25 ng/L — ABNORMAL HIGH (ref <18)

## 2022-12-18 MED ORDER — ACETAMINOPHEN 325 MG PO TABS: 650.0000 mg | ORAL_TABLET | ORAL | Status: DC | PRN

## 2022-12-18 MED ORDER — ORAL CARE MOUTH RINSE: 15.0000 mL | OROMUCOSAL | Status: DC | PRN

## 2022-12-18 MED ORDER — GABAPENTIN 100 MG PO CAPS
100.0000 mg | ORAL_CAPSULE | Freq: Three times a day (TID) | ORAL | Status: DC
  Administered 2022-12-18 – 2022-12-23 (×15): 100 mg via ORAL
  Filled 2022-12-18 (×15): qty 1

## 2022-12-18 MED ORDER — ONDANSETRON HCL 4 MG/2ML IJ SOLN: 4.0000 mg | Freq: Four times a day (QID) | INTRAMUSCULAR | Status: DC | PRN

## 2022-12-18 MED ORDER — FUROSEMIDE 10 MG/ML IJ SOLN
40.0000 mg | Freq: Two times a day (BID) | INTRAMUSCULAR | Status: DC
  Administered 2022-12-18 – 2022-12-20 (×4): 40 mg via INTRAVENOUS
  Filled 2022-12-18 (×4): qty 4

## 2022-12-18 MED ORDER — FUROSEMIDE 10 MG/ML IJ SOLN
40.0000 mg | Freq: Once | INTRAMUSCULAR | Status: AC
  Administered 2022-12-18: 40 mg via INTRAVENOUS
  Filled 2022-12-18: qty 4

## 2022-12-18 MED ORDER — POTASSIUM CHLORIDE CRYS ER 20 MEQ PO TBCR
20.0000 meq | EXTENDED_RELEASE_TABLET | Freq: Two times a day (BID) | ORAL | Status: DC
  Administered 2022-12-18 – 2022-12-20 (×4): 20 meq via ORAL
  Filled 2022-12-18 (×4): qty 1

## 2022-12-18 MED ORDER — TRAMADOL HCL 50 MG PO TABS
50.0000 mg | ORAL_TABLET | Freq: Four times a day (QID) | ORAL | Status: DC | PRN
  Administered 2022-12-20 – 2022-12-21 (×3): 50 mg via ORAL
  Filled 2022-12-18 (×4): qty 1

## 2022-12-18 MED ORDER — SODIUM CHLORIDE 0.9% FLUSH
3.0000 mL | Freq: Two times a day (BID) | INTRAVENOUS | Status: DC
  Administered 2022-12-18 – 2022-12-23 (×10): 3 mL via INTRAVENOUS

## 2022-12-18 MED ORDER — ENOXAPARIN SODIUM 30 MG/0.3ML IJ SOSY
30.0000 mg | PREFILLED_SYRINGE | INTRAMUSCULAR | Status: DC
  Administered 2022-12-18 – 2022-12-22 (×5): 30 mg via SUBCUTANEOUS
  Filled 2022-12-18 (×5): qty 0.3

## 2022-12-18 MED ORDER — SODIUM CHLORIDE 0.9% FLUSH: 3.0000 mL | INTRAVENOUS | Status: DC | PRN

## 2022-12-18 MED ORDER — METOPROLOL SUCCINATE ER 25 MG PO TB24
25.0000 mg | ORAL_TABLET | Freq: Every day | ORAL | Status: DC
Start: 2022-12-18 — End: 2022-12-24
  Administered 2022-12-18 – 2022-12-23 (×6): 25 mg via ORAL
  Filled 2022-12-18 (×6): qty 1

## 2022-12-18 MED ORDER — ESCITALOPRAM OXALATE 10 MG PO TABS
5.0000 mg | ORAL_TABLET | Freq: Every day | ORAL | Status: DC
  Administered 2022-12-19 – 2022-12-23 (×5): 5 mg via ORAL
  Filled 2022-12-18 (×6): qty 1

## 2022-12-18 MED ORDER — ATORVASTATIN CALCIUM 10 MG PO TABS
20.0000 mg | ORAL_TABLET | Freq: Every day | ORAL | Status: DC
  Administered 2022-12-19 – 2022-12-23 (×5): 20 mg via ORAL
  Filled 2022-12-18 (×6): qty 2

## 2022-12-18 MED ORDER — SODIUM CHLORIDE 0.9 % IV SOLN: 250.0000 mL | INTRAVENOUS | Status: AC | PRN

## 2022-12-18 MED ORDER — IRBESARTAN 300 MG PO TABS
150.0000 mg | ORAL_TABLET | Freq: Every day | ORAL | Status: DC
Start: 2022-12-18 — End: 2022-12-24
  Administered 2022-12-18 – 2022-12-23 (×6): 150 mg via ORAL
  Filled 2022-12-18 (×6): qty 1

## 2022-12-18 NOTE — ED Provider Notes (Signed)
[place] EMERGENCY DEPARTMENT AT [facility] Provider Note   CSN: [id number] Arrival date & time: 12/18/22  1124     History  Chief Complaint  Patient presents with   Shortness of Breath    Kristen Pham is a 89 y.o. female.  Patient is a 89 year old female with a history of hypertension, aortic stenosis, PAD, atherosclerosis who is presenting today with 3 to 4 days of worsening shortness of breath.  Patient reports that she is now having orthopnea, dyspnea on exertion, even some shortness of breath at rest.  She has noticed swelling in her legs recently as well.  She denies any chest pain, significant cough or fever.  She denies ever having symptoms like this before.  No prior history of PE or DVT.  No recent immobilization.  She did go to her PCP office today and at that time they said her oxygen was 83% when she walked in but did improve with sitting down.  They did an x-ray in the office but did not get a formal read.  They reported her white blood cell count was a little bit elevated but did not bring any of those results with her.  The history is provided by the patient and a relative.  Shortness of Breath      Home Medications Prior to Admission medications   Medication Sig Start Date End Date Taking? Authorizing Provider  acetaminophen (TYLENOL) 325 MG tablet Take 650 mg by mouth every 6 (six) hours as needed.    [provider]  amLODipine (NORVASC) 10 MG tablet Take 10 mg by mouth daily. 03/29/12   [provider]  atenolol (TENORMIN) 50 MG tablet Take 50 mg by mouth daily. 03/29/12   [provider]  atorvastatin (LIPITOR) 20 MG tablet Take 1 tablet (20 mg total) by mouth daily. 05/19/22 08/17/22  Iran Ouch, MD  calcium-vitamin D (OSCAL WITH D) 500-200 MG-UNIT per tablet Take 2 tablets by mouth.    [provider]  doxycycline (PERIOSTAT) 20 MG tablet Take 5 tablets (100 mg total) by mouth 2 (two) times daily. 10/01/22    Hyatt, Max T, DPM  doxycycline (VIBRAMYCIN) 50 MG capsule Take 2 capsules (100 mg total) by mouth 2 (two) times daily. 10/09/22   Adonis Huguenin, NP  escitalopram (LEXAPRO) 5 MG tablet Take 5 mg by mouth daily. 09/22/22   [provider]  gabapentin (NEURONTIN) 100 MG capsule Take 1-2 capsules (100-200 mg total) by mouth 3 (three) times daily. 12/14/22   Fanny Dance, MD  ibuprofen (ADVIL) 200 MG tablet Take 200 mg by mouth every 6 (six) hours as needed.    [provider]  mirabegron ER (MYRBETRIQ) 25 MG TB24 tablet Take 25 mg by mouth daily.    [provider]  mupirocin ointment (BACTROBAN) 2 % Apply 1 Application topically 2 (two) times daily. 09/30/22   Hyatt, Max T, DPM  RESTASIS 0.05 % ophthalmic emulsion  05/16/18   [provider]  traMADol (ULTRAM) 50 MG tablet Take 1 tablet (50 mg total) by mouth every 6 (six) hours as needed. 12/14/22   Fanny Dance, MD      Allergies    Ivp dye [iodinated contrast media]    Review of Systems   Review of Systems  Respiratory:  Positive for shortness of breath.     Physical Exam Updated Vital Signs BP (!) 161/77   Pulse 70   Temp 97.9 F (36.6 C)   Resp 20  Wt 49.9 kg   SpO2 99%   BMI 22.22 kg/m  Physical Exam Vitals and nursing note reviewed.  Constitutional:      General: She is not in acute distress.    Appearance: She is well-developed.  HENT:     Head: Normocephalic and atraumatic.  Eyes:     Pupils: Pupils are equal, round, and reactive to light.  Cardiovascular:     Rate and Rhythm: Normal rate and regular rhythm.     Heart sounds: Murmur heard.     No friction rub.     Comments: 2 out of 6 systolic murmur heard best at the left sternal border Pulmonary:     Effort: Pulmonary effort is normal. Tachypnea present.     Breath sounds: Rales present. No wheezing.  Abdominal:     General: Bowel sounds are normal. There is no distension.     Palpations: Abdomen is soft.      Tenderness: There is no abdominal tenderness. There is no guarding or rebound.  Musculoskeletal:        General: No tenderness. Normal range of motion.     Right lower leg: Edema present.     Left lower leg: Edema present.     Comments: 2+ pitting edema in the right lower extremity 1+ in the left.  Minimal edema in the arms  Skin:    General: Skin is warm and dry.     Findings: No rash.  Neurological:     Mental Status: She is alert and oriented to person, place, and time. Mental status is at baseline.     Cranial Nerves: No cranial nerve deficit.  Psychiatric:        Behavior: Behavior normal.     ED Results / Procedures / Treatments   Labs (all labs ordered are listed, but only abnormal results are displayed) Labs Reviewed  CBC - Abnormal; Notable for the following components:      Result Value   RBC 3.68 (*)    Hemoglobin 11.1 (*)    HCT 33.3 (*)    All other components within normal limits  COMPREHENSIVE METABOLIC PANEL - Abnormal; Notable for the following components:   Glucose, Bld 113 (*)    BUN 25 (*)    GFR, Estimated 54 (*)    All other components within normal limits  BRAIN NATRIURETIC PEPTIDE - Abnormal; Notable for the following components:   B Natriuretic Peptide 739.3 (*)    All other components within normal limits  TROPONIN I (HIGH SENSITIVITY) - Abnormal; Notable for the following components:   Troponin I (High Sensitivity) 21 (*)    All other components within normal limits  RESP PANEL BY RT-PCR (RSV, FLU A&B, COVID)  RVPGX2  TROPONIN I (HIGH SENSITIVITY)    EKG EKG Interpretation Date/Time:  Friday December 18 2022 11:35:27 EST Ventricular Rate:  77 PR Interval:  148 QRS Duration:  78 QT Interval:  404 QTC Calculation: 457 R Axis:   52  Text Interpretation: Sinus rhythm with Premature atrial complexes Otherwise normal ECG When compared with ECG of 24-Jul-2009 19:37, Premature atrial complexes are now Present T wave amplitude has decreased in  Anterior leads Confirmed by Gwyneth Sprout ([id number]) on 12/18/2022 11:51:03 AM  Radiology DG Chest Port 1 View  Result Date: 12/18/2022 CLINICAL DATA:  Shortness of breath EXAM: PORTABLE CHEST 1 VIEW COMPARISON:  04/04/2016 FINDINGS: Heart size is mildly enlarged. Aortic atherosclerosis. Small bilateral pleural effusions with associated bibasilar opacities. No pneumothorax. IMPRESSION: Small  bilateral pleural effusions with associated bibasilar opacities, which may represent atelectasis or pneumonia. Electronically Signed   By: Duanne Guess D.O.   On: 12/18/2022 12:23    Procedures Procedures    Medications Ordered in ED Medications  furosemide (LASIX) injection 40 mg (has no administration in time range)    ED Course/ Medical Decision Making/ A&P                                 Medical Decision Making Amount and/or Complexity of Data Reviewed Independent Historian:     Details: daughter External Data Reviewed: notes. Labs: ordered. Decision-making details documented in ED Course. Radiology: ordered and independent interpretation performed. Decision-making details documented in ED Course. ECG/medicine tests: ordered and independent interpretation performed. Decision-making details documented in ED Course.  Risk Prescription drug management. Decision regarding hospitalization.   Pt with multiple medical problems and comorbidities and presenting today with a complaint that caries a high risk for morbidity and mortality.  Here today with symptoms of orthopnea, DOE and found to be hypoxic here.  While sitting in the bed oxygen dropped as low as 86%.  The best oxygen saturation I saw while on continuous cardiac monitoring was 91%.  Concern for CHF versus infectious etiology such as RSV or COVID and lower suspicion for PE.  Patient placed on 2 L nasal cannula.  I independently interpreted patient's EKG which showed sinus rhythm with PACs but no acute findings.  Labs and imaging are  pending.  2:19 PM I independently interpreted patient's labs and viral swab is negative, CMP without acute findings, BNP elevated today at 739, CBC without acute findings and troponin mildly elevated at 21.  I have independently visualized and interpreted pt's images today. Chest x-ray with evidence of fluid overload.  Given patient's symptoms and story most consistent with CHF.  CHF may be related to worsening aortic stenosis versus cardiomyopathy.  Normal renal function at this time.  Patient was given IV Lasix.  Will admit for further care and new echo.  Consult to the hospitalist for admission.  Discussed the findings with the patient and her daughter.  They are comfortable with this plan. CRITICAL CARE Performed by: Diamond Jentz Total critical care time: 30 minutes Critical care time was exclusive of separately billable procedures and treating other patients. Critical care was necessary to treat or prevent imminent or life-threatening deterioration. Critical care was time spent personally by me on the following activities: development of treatment plan with patient and/or surrogate as well as nursing, discussions with consultants, evaluation of patient's response to treatment, examination of patient, obtaining history from patient or surrogate, ordering and performing treatments and interventions, ordering and review of laboratory studies, ordering and review of radiographic studies, pulse oximetry and re-evaluation of patient's condition.          Final Clinical Impression(s) / ED Diagnoses Final diagnoses:  Acute congestive heart failure, unspecified heart failure type (HCC)  Acute respiratory failure with hypoxia [facility])    Rx / DC Orders ED Discharge Orders     None         Gwyneth Sprout, MD 12/18/22 1419

## 2022-12-18 NOTE — ED Notes (Signed)
[place] with cl called for transport

## 2022-12-18 NOTE — H&P (Addendum)
History and Physical    Kristen Pham ZOX:[id number] DOB: [birth year] DOA: 12/18/2022  PCP: Creola Corn, MD  Patient coming from: Home  Chief Complaint: SOB  HPI: Kristen Pham is a 89 y.o. female with medical history significant of HFpEF (diastolic grade 2 08/1189), PAD/atherosclerosis, anxiety/depression, chronic toe pain, and hypertension presenting with a several day history of shortness of breath.  Her daughter is bedside helping with the history.  4 days ago, the patient started having shortness of breath.  Things progressively worsened.  She is also gaining weight and swelling in both of her legs.  She is not on any medication for her heart failure.  Slight cough.  No fevers or chest pain.  She went to her PCPs office earlier in the day and her oxygen level was 83%.  She was sent to the emergency department for further evaluation.  Of note, she denies any recent trauma, prolonged bedrest, surgeries, or medication changes.  ED Course: Chest x-ray shows small pleural effusions and possible atelectasis versus pneumonia.  Troponins slightly elevated but no significant spike.  BNP elevated at 793.  Hemoglobin 11.1.  She was given IV Lasix, placed on supplemental oxygen and moved to the floor for admission.  Review of Systems: As per HPI otherwise 10 point review of systems negative.   Past Medical History:  Diagnosis Date   Carpal tunnel syndrome    Hypertension     Past Surgical History:  Procedure Laterality Date   ABDOMINAL HYSTERECTOMY     CESAREAN SECTION     HAND SURGERY     bilateral release    TONSILECTOMY, ADENOIDECTOMY, BILATERAL MYRINGOTOMY AND TUBES       reports that she has never smoked. She has never used smokeless tobacco. She reports current alcohol use. She reports that she does not use drugs.  Allergies  Allergen Reactions   Ivp Dye [Iodinated Contrast Media]     Felt confused, disoriented    Family History  Problem Relation Age of Onset   Cerebral  aneurysm Father     Prior to Admission medications   Medication Sig Start Date End Date Taking? Authorizing Provider  acetaminophen (TYLENOL) 325 MG tablet Take 650 mg by mouth every 6 (six) hours as needed.    [provider]  amLODipine (NORVASC) 10 MG tablet Take 10 mg by mouth daily. 03/29/12   [provider]  atenolol (TENORMIN) 50 MG tablet Take 50 mg by mouth daily. 03/29/12   [provider]  atorvastatin (LIPITOR) 20 MG tablet Take 1 tablet (20 mg total) by mouth daily. 05/19/22 08/17/22  Iran Ouch, MD  calcium-vitamin D (OSCAL WITH D) 500-200 MG-UNIT per tablet Take 2 tablets by mouth.    [provider]  doxycycline (PERIOSTAT) 20 MG tablet Take 5 tablets (100 mg total) by mouth 2 (two) times daily. 10/01/22   Hyatt, Max T, DPM  doxycycline (VIBRAMYCIN) 50 MG capsule Take 2 capsules (100 mg total) by mouth 2 (two) times daily. 10/09/22   Adonis Huguenin, NP  escitalopram (LEXAPRO) 5 MG tablet Take 5 mg by mouth daily. 09/22/22   [provider]  gabapentin (NEURONTIN) 100 MG capsule Take 1-2 capsules (100-200 mg total) by mouth 3 (three) times daily. 12/14/22   Fanny Dance, MD  ibuprofen (ADVIL) 200 MG tablet Take 200 mg by mouth every 6 (six) hours as needed.    [provider]  mirabegron ER (MYRBETRIQ) 25 MG TB24 tablet Take 25 mg by mouth daily.  [provider]  mupirocin ointment (BACTROBAN) 2 % Apply 1 Application topically 2 (two) times daily. 09/30/22   Hyatt, Max T, DPM  RESTASIS 0.05 % ophthalmic emulsion  05/16/18   [provider]  traMADol (ULTRAM) 50 MG tablet Take 1 tablet (50 mg total) by mouth every 6 (six) hours as needed. 12/14/22   Fanny Dance, MD    Physical Exam: Vitals:   12/18/22 1656 12/18/22 1800 12/18/22 1816 12/18/22 1829  BP: (!) 154/92  (!) 128/99 (!) 171/81  Pulse: 85  99 88  Resp: (!) 25  20 20   Temp:   97.7 F (36.5 C)   TempSrc:      SpO2: 95%  97% 96%   Weight:  50.1 kg    Height:  5' (1.524 m)      Constitutional: NAD, calm, comfortable Eyes: PERRL, lids and conjunctivae normal ENMT: Mucous membranes are moist. Posterior pharynx clear of any exudate or lesions.hard of hearing. Neck: normal, supple, no masses, no thyromegaly Respiratory: clear to auscultation bilaterally, no wheezing, + basilar crackles. Normal respiratory effort. No accessory muscle use.  Cardiovascular: RRR.  2+ pitting bilateral lower extremity edema tapering at the knees bilaterally. 2+ pedal pulses. No carotid bruits.  Abdomen: no tenderness, no masses palpated. No hepatosplenomegaly. Bowel sounds positive.  Musculoskeletal: no clubbing / cyanosis. No joint deformity upper and lower extremities. Good ROM, no contractures.  No significant unilateral tenderness. Skin: no rashes, lesions, ulcers.  Neurologic: CN 2-12 grossly intact. Psychiatric: Normal judgment and insight. Alert and oriented x 3. Normal mood.   Labs on Admission: I have personally reviewed following labs and imaging studies  CBC: Recent Labs  Lab 12/18/22 1134  WBC 9.0  HGB 11.1*  HCT 33.3*  MCV 90.5  PLT 287   Basic Metabolic Panel: Recent Labs  Lab 12/18/22 1204  NA 137  K 4.0  CL 102  CO2 26  GLUCOSE 113*  BUN 25*  CREATININE 0.96  CALCIUM 9.3   GFR: Estimated Creatinine Clearance: 24.6 mL/min (by C-G formula based on SCr of 0.96 mg/dL).  Liver Function Tests: Recent Labs  Lab 12/18/22 1204  AST 23  ALT 29  ALKPHOS 88  BILITOT 1.1  PROT 7.2  ALBUMIN 4.0    Recent Results (from the past 240 hour(s))  Resp panel by RT-PCR (RSV, Flu A&B, Covid) Anterior Nasal Swab     Status: None   Collection Time: 12/18/22 12:18 PM   Specimen: Anterior Nasal Swab  Result Value Ref Range Status   SARS Coronavirus 2 by RT PCR NEGATIVE NEGATIVE Final    Comment: (NOTE) SARS-CoV-2 target nucleic acids are NOT DETECTED.  The SARS-CoV-2 RNA is generally detectable in upper  respiratory specimens during the acute phase of infection. The lowest concentration of SARS-CoV-2 viral copies this assay can detect is 138 copies/mL. A negative result does not preclude SARS-Cov-2 infection and should not be used as the sole basis for treatment or other patient management decisions. A negative result may occur with  improper specimen collection/handling, submission of specimen other than nasopharyngeal swab, presence of viral mutation(s) within the areas targeted by this assay, and inadequate number of viral copies(<138 copies/mL). A negative result must be combined with clinical observations, patient history, and epidemiological information. The expected result is Negative.  Fact Sheet for Patients:  BloggerCourse.com  Fact Sheet for Healthcare Providers:  SeriousBroker.it  This test is no t yet approved or cleared by the [place] and  has been authorized  for detection and/or diagnosis of SARS-CoV-2 by FDA under an Emergency Use Authorization (EUA). This EUA will remain  in effect (meaning this test can be used) for the duration of the COVID-19 declaration under Section 564(b)(1) of the Act, 21 U.S.C.section 360bbb-3(b)(1), unless the authorization is terminated  or revoked sooner.       Influenza A by PCR NEGATIVE NEGATIVE Final   Influenza B by PCR NEGATIVE NEGATIVE Final    Comment: (NOTE) The Xpert Xpress SARS-CoV-2/FLU/RSV plus assay is intended as an aid in the diagnosis of influenza from Nasopharyngeal swab specimens and should not be used as a sole basis for treatment. Nasal washings and aspirates are unacceptable for Xpert Xpress SARS-CoV-2/FLU/RSV testing.  Fact Sheet for Patients: BloggerCourse.com  Fact Sheet for Healthcare Providers: SeriousBroker.it  This test is not yet approved or cleared by the [place] FDA and has been  authorized for detection and/or diagnosis of SARS-CoV-2 by FDA under an Emergency Use Authorization (EUA). This EUA will remain in effect (meaning this test can be used) for the duration of the COVID-19 declaration under Section 564(b)(1) of the Act, 21 U.S.C. section 360bbb-3(b)(1), unless the authorization is terminated or revoked.     Resp Syncytial Virus by PCR NEGATIVE NEGATIVE Final    Comment: (NOTE) Fact Sheet for Patients: BloggerCourse.com  Fact Sheet for Healthcare Providers: SeriousBroker.it  This test is not yet approved or cleared by the [place] FDA and has been authorized for detection and/or diagnosis of SARS-CoV-2 by FDA under an Emergency Use Authorization (EUA). This EUA will remain in effect (meaning this test can be used) for the duration of the COVID-19 declaration under Section 564(b)(1) of the Act, 21 U.S.C. section 360bbb-3(b)(1), unless the authorization is terminated or revoked.  Performed at [facility], [address], [place], [place] [address]      Radiological Exams on Admission: DG Chest Port 1 View  Result Date: 12/18/2022 CLINICAL DATA:  Shortness of breath EXAM: PORTABLE CHEST 1 VIEW COMPARISON:  04/04/2016 FINDINGS: Heart size is mildly enlarged. Aortic atherosclerosis. Small bilateral pleural effusions with associated bibasilar opacities. No pneumothorax. IMPRESSION: Small bilateral pleural effusions with associated bibasilar opacities, which may represent atelectasis or pneumonia. Electronically Signed   By: Duanne Guess D.O.   On: 12/18/2022 12:23    EKG: Independently reviewed.  PACs noted.  Assessment/Plan Principal Problem:   Acute diastolic heart failure (HCC)  IV Lasix 40 mg twice daily along with potassium supplementation  I/os, daily weights, low-sodium diet  Change Norvasc 10 mg/d to Avapro 150 mg/d and atenolol 50 mg/d to metoprolol succinate 25 mg  daily  [place] O2 as needed, currently on 2 L  Active Problems:   Chronic toe pain, left foot  Gabapentin 100 mg 2-3 times daily as at home  Tramadol 50 mg every 6 hours as needed    PAD (peripheral artery disease) (HCC)  Continue home Lipitor 20 mg daily    Essential hypertension  Changes as above    Anxiety with depression  Continue Lexapro 5 mg daily    Anemia  Monitor    Acute respiratory failure (HCC)  Oxygen supplementation as above  DVT prophylaxis: Lovenox Code Status: Full Family Communication: Daughter at bedside Disposition Plan: Home Consults called: None Admission status: Inpatient  Severity of Illness: The appropriate patient status for this patient is INPATIENT. Inpatient status is judged to be reasonable and necessary in order to provide the required intensity of service to ensure the patient's safety. The patient's  presenting symptoms, physical exam findings, and initial radiographic and laboratory data in the context of their chronic comorbidities is felt to place them at high risk for further clinical deterioration. Furthermore, it is not anticipated that the patient will be medically stable for discharge from the hospital within 2 midnights of admission.   * I certify that at the point of admission it is my clinical judgment that the patient will require inpatient hospital care spanning beyond 2 midnights from the point of admission due to high intensity of service, high risk for further deterioration and high frequency of surveillance required.*  Sharlene Dory, DO [facility] [email or web address] 12/18/2022, 7:09 PM

## 2022-12-18 NOTE — ED Notes (Signed)
External catheter has been placed on patient. Patient is resting in bed at this time.

## 2022-12-18 NOTE — ED Triage Notes (Signed)
Pt bib daughter, c/o shob x 5 days, daughter reports pt has been wheezing, reports chest tightness. XR this morning at pcp

## 2022-12-18 NOTE — ED Notes (Signed)
Report given to the RN on the Floor.  

## 2022-12-18 NOTE — ED Notes (Signed)
Gave patient ginger ale to drink. Patient is resting in bed with family member at bedside.

## 2022-12-18 NOTE — Progress Notes (Addendum)
Plan of Care Note for accepted transfer    Patient: Kristen Pham MRN: [id number] DOB: 08/13/1924 DOA: 12/18/2022   Facility requesting transfer: [facility] Requesting Provider: Dr. Anitra Lauth Reason for transfer: Decompensated CHF Facility course:    89 yo with HTN, PAD, known severe aortic stenosis, HLD, diastolic CHF, here with worsening fluid overload, LE edema, orthopnea and worsening DOE. Not on diuretics at home but appears to have decompensated CHF, BNP is elevated as well and CXR shows signs of fluid overload. Saw PCP today and was hypoxic in office and sent to ER. Satting mid 80s on room air.  Was placed on oxygen and given Lasix, [facility] was asked for admission   Plan of care: The patient is accepted for admission to cardiac telemetry unit, at [facility].  Author: Pamella Pert, MD, PhD [facility]   Check [email or web address] for on-call coverage.   Nursing staff, Please call [facility] Admits & Consults System-Wide number on Amion as soon as patient's arrival, so appropriate admitting provider can evaluate the patient

## 2022-12-18 NOTE — ED Notes (Signed)
Report given to [facility]. 

## 2022-12-18 NOTE — ED Notes (Signed)
Cleaned patient up after pure wick became displaced. New bedding, clothing, and a fresh pure wick for patient. Patient is resting and waiting for transport at this time.

## 2022-12-18 NOTE — ED Notes (Signed)
RT assessed the Pt and her lung sounds were clear. The daughter stated that they heard a wheeze yesterday

## 2022-12-18 NOTE — ED Notes (Signed)
Pt placed on 2lpm O2 by the provider... Provider noted O2 86% RA while sitting... RRT Notified.Marland KitchenMarland Kitchen

## 2022-12-19 ENCOUNTER — Inpatient Hospital Stay (HOSPITAL_COMMUNITY): Payer: Medicare Other

## 2022-12-19 DIAGNOSIS — I739 Peripheral vascular disease, unspecified: Secondary | ICD-10-CM | POA: Diagnosis not present

## 2022-12-19 DIAGNOSIS — M79675 Pain in left toe(s): Secondary | ICD-10-CM

## 2022-12-19 DIAGNOSIS — I10 Essential (primary) hypertension: Secondary | ICD-10-CM | POA: Diagnosis not present

## 2022-12-19 DIAGNOSIS — I5033 Acute on chronic diastolic (congestive) heart failure: Secondary | ICD-10-CM

## 2022-12-19 DIAGNOSIS — I5031 Acute diastolic (congestive) heart failure: Secondary | ICD-10-CM

## 2022-12-19 DIAGNOSIS — J9601 Acute respiratory failure with hypoxia: Secondary | ICD-10-CM

## 2022-12-19 DIAGNOSIS — F418 Other specified anxiety disorders: Secondary | ICD-10-CM

## 2022-12-19 DIAGNOSIS — G8929 Other chronic pain: Secondary | ICD-10-CM

## 2022-12-19 LAB — ECHOCARDIOGRAM COMPLETE
AR max vel: 0.97 cm2
AV Area VTI: 1.1 cm2
AV Area mean vel: 0.96 cm2
AV Mean grad: 14 mm[Hg]
AV Peak grad: 24.4 mm[Hg]
Ao pk vel: 2.47 m/s
Area-P 1/2: 6.12 cm2
Height: 60 in
S' Lateral: 3.1 cm
Weight: 1763.68 [oz_av]

## 2022-12-19 LAB — CBC WITH DIFFERENTIAL/PLATELET
Abs Immature Granulocytes: 0.02 10*3/uL (ref 0.00–0.07)
Basophils Absolute: 0.1 10*3/uL (ref 0.0–0.1)
Basophils Relative: 1 %
Eosinophils Absolute: 0.3 10*3/uL (ref 0.0–0.5)
Eosinophils Relative: 4 %
HCT: 33.2 % — ABNORMAL LOW (ref 36.0–46.0)
Hemoglobin: 10.8 g/dL — ABNORMAL LOW (ref 12.0–15.0)
Immature Granulocytes: 0 %
Lymphocytes Relative: 10 %
Lymphs Abs: 0.8 10*3/uL (ref 0.7–4.0)
MCH: 29.3 pg (ref 26.0–34.0)
MCHC: 32.5 g/dL (ref 30.0–36.0)
MCV: 90 fL (ref 80.0–100.0)
Monocytes Absolute: 0.8 10*3/uL (ref 0.1–1.0)
Monocytes Relative: 11 %
Neutro Abs: 5.5 10*3/uL (ref 1.7–7.7)
Neutrophils Relative %: 74 %
Platelets: 292 10*3/uL (ref 150–400)
RBC: 3.69 MIL/uL — ABNORMAL LOW (ref 3.87–5.11)
RDW: 13.3 % (ref 11.5–15.5)
WBC: 7.4 10*3/uL (ref 4.0–10.5)
nRBC: 0 % (ref 0.0–0.2)

## 2022-12-19 LAB — BASIC METABOLIC PANEL
Anion gap: 10 (ref 5–15)
BUN: 17 mg/dL (ref 8–23)
CO2: 28 mmol/L (ref 22–32)
Calcium: 8.7 mg/dL — ABNORMAL LOW (ref 8.9–10.3)
Chloride: 101 mmol/L (ref 98–111)
Creatinine, Ser: 0.9 mg/dL (ref 0.44–1.00)
GFR, Estimated: 59 mL/min — ABNORMAL LOW (ref >60)
Glucose, Bld: 102 mg/dL — ABNORMAL HIGH (ref 70–99)
Potassium: 3.7 mmol/L (ref 3.5–5.1)
Sodium: 139 mmol/L (ref 135–145)

## 2022-12-19 NOTE — Progress Notes (Signed)
  Progress Note   Patient: Kristen Pham ZOX:[id number] DOB: [birth year] DOA: 12/18/2022     1 DOS: the patient was seen and examined on 12/19/2022   Brief hospital course: Mrs. Dennington was admitted to the hospital with the working diagnosis of heart failure exacerbation.   89 yo female with the past medical history of heart failure, peripheral vascular disease, hypertension and depression who presented with dyspnea. Reported 4 days of worsening dyspnea, associated with lower extremity edema and weight gain. On the day of admission she was evaluated at her primary care office and was noted to have a 02 saturation of 83%, then she was referred to the ED for further evaluation.  On her initial physical examination her blood pressure was 154/92. HR 99, RR 25 and 02 saturation 95% on supplemental 02 per [place]. Lungs with rales bilaterally, heart with S1 and S2 present and regular with no gallops, ribs or murmurs, abdomen with no distention and positive lower extremity edema.   Chest radiograph with cardiomegaly, bilateral hilar vascular congestion with small bilateral pleural effusions.     Assessment and Plan: * Acute on chronic diastolic CHF (congestive heart failure) (HCC) Echocardiogram with preserved LV systolic function with EF 55 to 60%, RV systolic function is preserved, LA with moderate dilatation, RA with mild dilatation, mild to moderate MR,   Urine output is 2,500 cc  Systolic blood pressure 140 to 120 mmHg  Plan to continue diuresis with furosemide 40 mg IV bid.  Continue irbesartan and metoprolol.  Possible addition of MRA if renal function stable.   Essential hypertension Continue blood pressure control with metoprolol Aggressive diuresis with IV furosemide.   PAD (peripheral artery disease) (HCC) Continue atorvastatin.    Anemia Cell count stable, will check iron panel.   Chronic toe pain, left foot Continue pain control with tramadol.   Anxiety with  depression Continue with escitalopram.         Subjective: Patient is feeling better, she is out of bed to the chair, dyspnea and edema are improving.   Physical Exam: Vitals:   12/19/22 1100 12/19/22 1200 12/19/22 1240 12/19/22 1300  BP:   (!) 168/67   Pulse: 87 (!) 56 73   Resp: 17 15 20 18   Temp:   98.1 F (36.7 C)   TempSrc:   Oral   SpO2: (!) 89% 99% 100%   Weight:      Height:       Neurology awake and alert ENT with mild pallor Cardiovascular with S1 and S2 present and regular with positive systolic murmur at the apex Mild JVD Trace lower extremity edema Respiratory with mild rales at bases with no wheezing or rhonchi Abdomen with no distention  Data Reviewed:    Family Communication: no family at the bedside   Disposition: Status is: Inpatient Remains inpatient appropriate because: IV diuresis   Planned Discharge Destination: Home  Author: Coralie Keens, MD 12/19/2022 2:13 PM  For on call review [email or web address].

## 2022-12-19 NOTE — Assessment & Plan Note (Signed)
Continue blood pressure control with metoprolol Aggressive diuresis with IV furosemide.

## 2022-12-19 NOTE — Assessment & Plan Note (Signed)
Continue pain control with tramadol.

## 2022-12-19 NOTE — Assessment & Plan Note (Signed)
Iron panel with serum iron 29, TIBC 281, transferrin saturation 10 and ferritin 104,  Consistent with anemia of iron deficiency  Will add IV iron today.

## 2022-12-19 NOTE — Assessment & Plan Note (Signed)
Continue with escitalopram.  ?

## 2022-12-19 NOTE — Hospital Course (Signed)
Kristen Pham was admitted to the hospital with the working diagnosis of heart failure exacerbation.   89 yo female with the past medical history of heart failure, peripheral vascular disease, hypertension and depression who presented with dyspnea. Reported 4 days of worsening dyspnea, associated with lower extremity edema and weight gain. On the day of admission she was evaluated at her primary care office and was noted to have a 02 saturation of 83%, then she was referred to the ED for further evaluation.  On her initial physical examination her blood pressure was 154/92. HR 99, RR 25 and 02 saturation 95% on supplemental 02 per [place]. Lungs with rales bilaterally, heart with S1 and S2 present and regular with no gallops, ribs or murmurs, abdomen with no distention and positive lower extremity edema.   Na 137, K 4,0 Cl 102, bicarbonate 26 glucose 113, bun 25 cr 0,96  BNP 739  High sensitive troponin 21  Wbc 9,0 hgb 11.1 plt 287  Sars covid 19 negative  Chest radiograph with cardiomegaly, bilateral hilar vascular congestion with small bilateral pleural effusions.   EKG 77 bpm, normal axis, normal intervals, sinus rhythm with PAC, no significant ST segment or T wave changes.   Echocardiogram with preserved LV systolic function with mild to moderate mitral regurgitation.   12/01 patient with improvement in volume status. Very weak and deconditioned, will need SNF.  12/02 volume status has improved, now pending transfer to SNF.  12/03 plan to transfer to SNF to continue physical therapy.

## 2022-12-19 NOTE — Assessment & Plan Note (Signed)
Echocardiogram with preserved LV systolic function with EF 55 to 60%, RV systolic function is preserved, LA with moderate dilatation, RA with mild dilatation, mild to moderate mitral valve regurgitation, mild aortic valve stenosis.   Urine output is 1,650  cc  Systolic blood pressure 150's mmHg Patient has lost about 3 Kg since admission.   Continue irbesartan and metoprolol.  Plan to transition to add spironolactone and transition to po furosemide.

## 2022-12-19 NOTE — Progress Notes (Signed)
Echocardiogram 2D Echocardiogram has been performed.  Kristen Pham N Neco Kling,RDCS 12/19/2022, 10:02 AM

## 2022-12-19 NOTE — Assessment & Plan Note (Signed)
Continue atorvastatin

## 2022-12-19 NOTE — Evaluation (Signed)
Physical Therapy Evaluation Patient Details Name: Kristen Pham MRN: [id number] DOB: 1924/08/24 Today's Date: 12/19/2022  History of Present Illness  89 y.o. female presents to [facility] 12/18/22 w/ SOB, weight gain, and BLE edema. Chest x-ray showed small pleural effusions and possible atelectasis vs. PNA. PMHx: HTN, HFpEF, PAD/atherosclerosis, anxiety/depression, chronic toe pain   Clinical Impression  Pt in bed upon arrival and agreeable to PT eval. Prior to admission, pt was modI with rollator outside her room and in the community. Pt currently needs MinA for bed mobility and CGA to stand and ambulate in the room. Pt reports having decreased gait speed compared to her functional mobility baseline. Pt presents to therapy session with decreased strength, balance, and activity tolerance. Pt would benefit from acute skilled PT to address functional impairments. Pt is already established with HHPT and would benefit from continuing services. Acute PT to follow.          If plan is discharge home, recommend the following: A little help with walking and/or transfers;A little help with bathing/dressing/bathroom;Help with stairs or ramp for entrance;Assist for transportation   Can travel by private vehicle    Yes    Equipment Recommendations None recommended by PT     Functional Status Assessment Patient has had a recent decline in their functional status and demonstrates the ability to make significant improvements in function in a reasonable and predictable amount of time.     Precautions / Restrictions Precautions Precautions: Fall Precaution Comments: watch HR and O2 Restrictions Weight Bearing Restrictions: No      Mobility  Bed Mobility Overal bed mobility: Needs Assistance Bed Mobility: Supine to Sit     Supine to sit: Min assist     General bed mobility comments: min A for trunk elevation    Transfers Overall transfer level: Needs assistance Equipment used: Rollator (4  wheels) Transfers: Sit to/from Stand Sit to Stand: Contact guard assist      General transfer comment: CGA from bed level surface    Ambulation/Gait Ambulation/Gait assistance: Contact guard assist Gait Distance (Feet): 40 Feet Assistive device: Rollator (4 wheels) Gait Pattern/deviations: Step-through pattern, Narrow base of support, Shuffle       General Gait Details: decreased gait speed w/ short steps, steady with rollator        Balance Overall balance assessment: Mild deficits observed, not formally tested, Needs assistance Sitting-balance support: No upper extremity supported, Feet supported Sitting balance-Leahy Scale: Good     Standing balance support: Bilateral upper extremity supported, During functional activity, Reliant on assistive device for balance Standing balance-Leahy Scale: Poor Standing balance comment: reliant on RW         Pertinent Vitals/Pain Pain Assessment Pain Assessment: No/denies pain    Home Living Family/patient expects to be discharged to:: Assisted living ([facility])      Home Equipment: Rollator (4 wheels);Grab bars - tub/shower;Grab bars - toilet;Shower seat - built in Additional Comments: currently getting PT/OT at [facility]. gets food at dining room, uses rollator in hallway, but not in apt,    Prior Function Prior Level of Function : Needs assist      Mobility Comments: Uses rollator outside her room and in community. Doesn't like to use rollator in apartment b/c it is a small space. Established with PT ADLs Comments: supervision, getting OT     Extremity/Trunk Assessment   Upper Extremity Assessment Upper Extremity Assessment: Defer to OT evaluation    Lower Extremity Assessment Lower Extremity Assessment: Generalized weakness    Cervical /  Trunk Assessment Cervical / Trunk Assessment: Kyphotic  Communication   Communication Communication: Hearing impairment Cueing Techniques: Verbal cues  Cognition Arousal:  Alert Behavior During Therapy: WFL for tasks assessed/performed Overall Cognitive Status: Within Functional Limits for tasks assessed    General Comments: HOH        General Comments General comments (skin integrity, edema, etc.): VSS on 4L, BP in supine- 146/70, 93     PT Assessment Patient needs continued PT services  PT Problem List Decreased strength;Decreased balance;Decreased activity tolerance;Decreased mobility       PT Treatment Interventions DME instruction;Gait training;Functional mobility training;Therapeutic activities;Therapeutic exercise;Balance training;Patient/family education    PT Goals (Current goals can be found in the Care Plan section)  Acute Rehab PT Goals Patient Stated Goal: to get stronger PT Goal Formulation: With patient Time For Goal Achievement: 01/02/23 Potential to Achieve Goals: Good    Frequency Min 1X/week        AM-PAC PT "6 Clicks" Mobility  Outcome Measure Help needed turning from your back to your side while in a flat bed without using bedrails?: None Help needed moving from lying on your back to sitting on the side of a flat bed without using bedrails?: A Little Help needed moving to and from a bed to a chair (including a wheelchair)?: A Little Help needed standing up from a chair using your arms (e.g., wheelchair or bedside chair)?: A Little Help needed to walk in hospital room?: A Little Help needed climbing 3-5 steps with a railing? : A Lot 6 Click Score: 18    End of Session Equipment Utilized During Treatment: Gait belt;Oxygen Activity Tolerance: Patient tolerated treatment well Patient left: in chair;with call bell/phone within reach (nurse tech present) Nurse Communication: Mobility status PT Visit Diagnosis: Unsteadiness on feet (R26.81);Muscle weakness (generalized) (M62.81)    Time: 2703-5009 PT Time Calculation (min) (ACUTE ONLY): 26 min   Charges:     PT Treatments $Therapeutic Activity: 8-22 mins PT General  Charges $$ ACUTE PT VISIT: 1 Visit         Hilton Cork, PT, DPT Secure Chat Preferred  Rehab Office [phone]   Arturo Morton Brion [facility] 12/19/2022, 1:46 PM

## 2022-12-19 NOTE — Plan of Care (Signed)
  Problem: Education: Goal: Knowledge of General Education information will improve Description Including pain rating scale, medication(s)/side effects and non-pharmacologic comfort measures Outcome: Progressing   Problem: Health Behavior/Discharge Planning: Goal: Ability to manage health-related needs will improve Outcome: Progressing   

## 2022-12-19 NOTE — Evaluation (Signed)
Occupational Therapy Evaluation Patient Details Name: Kristen Pham MRN: [id number] DOB: [birth year] Today's Date: 12/19/2022   History of Present Illness 89 y.o. female presents to [facility] 12/18/22 w/ SOB, weight gain, and BLE edema. Chest x-ray showed small pleural effusions and possible atelectasis vs. PNA. PMHx: HTN, HFpEF, PAD/atherosclerosis, anxiety/depression, chronic toe pain   Clinical Impression   Pt is resident at [facility]. Pt gets her meals in the dining room and walks around the facility with Rollator - but she tends to "furniture surf" in her room/apt. She has been doing her own bathing/dressing at supervision level and has also been enjoying PT/OT. Today she is very pleasant, cooperative. Able to perform transfers with Rollator initially at mod A, improving to min A. Ambulated to toilet, able to perform front and rear peri care with set up. Sink level grooming.  Pt did drop from 93-4% to 84% on 4L, able to bring back up to 90% with pursed lip breathing and standing rest breaks. At this time pt is overall set up/min A for ADL and min A/CGA for mobility with Rollator and 4L supplemental O2. At this time recommending continued OT in the acute setting followed by continued HHOT.       If plan is discharge home, recommend the following: A little help with walking and/or transfers;A little help with bathing/dressing/bathroom;Assist for transportation;Help with stairs or ramp for entrance    Functional Status Assessment  Patient has had a recent decline in their functional status and demonstrates the ability to make significant improvements in function in a reasonable and predictable amount of time.  Equipment Recommendations  None recommended by OT    Recommendations for Other Services PT consult     Precautions / Restrictions Precautions Precautions: Fall Precaution Comments: watch HR and O2 Restrictions Weight Bearing Restrictions: No      Mobility Bed Mobility Overal  bed mobility: Needs Assistance Bed Mobility: Supine to Sit     Supine to sit: Min assist     General bed mobility comments: min A for trunk elevation, GCA to bring hips EOB    Transfers Overall transfer level: Needs assistance Equipment used: Rollator (4 wheels) Transfers: Sit to/from Stand Sit to Stand: Mod assist, Min assist           General transfer comment: mod A initially from bed, progressed to min A from toilet vc for safe hand placement      Balance Overall balance assessment: Mild deficits observed, not formally tested                                         ADL either performed or assessed with clinical judgement   ADL Overall ADL's : Needs assistance/impaired Eating/Feeding: Set up   Grooming: Wash/dry hands;Oral care;Wash/dry face;Contact guard assist Grooming Details (indicate cue type and reason): at sink Upper Body Bathing: Supervision/ safety   Lower Body Bathing: Supervison/ safety   Upper Body Dressing : Set up;Sitting   Lower Body Dressing: Contact guard assist;Sitting/lateral leans Lower Body Dressing Details (indicate cue type and reason): donning socks Toilet Transfer: Contact guard assist;Ambulation Statistician Details (indicate cue type and reason): Rollator Toileting- Clothing Manipulation and Hygiene: Supervision/safety;Sitting/lateral lean Toileting - Clothing Manipulation Details (indicate cue type and reason): front and back peri care     Functional mobility during ADLs: Contact guard assist;Rollator (4 wheels)       Vision Ability to  See in Adequate Light: 0 Adequate Patient Visual Report: No change from baseline       Perception         Praxis         Pertinent Vitals/Pain Pain Assessment Pain Assessment: Faces Faces Pain Scale: No hurt Pain Intervention(s): Monitored during session, Repositioned     Extremity/Trunk Assessment Upper Extremity Assessment Upper Extremity Assessment: Overall  WFL for tasks assessed;Generalized weakness   Lower Extremity Assessment Lower Extremity Assessment: Defer to PT evaluation   Cervical / Trunk Assessment Cervical / Trunk Assessment: Kyphotic   Communication Communication Communication: Hearing impairment Cueing Techniques: Verbal cues;Gestural cues   Cognition Arousal: Alert Behavior During Therapy: WFL for tasks assessed/performed Overall Cognitive Status: Within Functional Limits for tasks assessed                                 General Comments: HOH     General Comments  Daughter present throughout and very supportive. SpO2 dropped from 93 on 4L to 84% with activity. HR up to 129 with ambulation. Pt very pleasant and cooperative.    Exercises     Shoulder Instructions      Home Living Family/patient expects to be discharged to:: Assisted living ([facility])                             Home Equipment: Rollator (4 wheels);Grab bars - tub/shower;Grab bars - toilet;Shower seat - built in   Additional Comments: currently getting PT/OT at [facility]. gets food at dining room, uses rollator in hallway, but not in apt,      Prior Functioning/Environment Prior Level of Function : Needs assist             Mobility Comments: Rollator for community and outside her rooms, does not like to use Rollator in apt due to tight space, getting PT ADLs Comments: supervision, getting OT        OT Problem List: Cardiopulmonary status limiting activity;Decreased activity tolerance      OT Treatment/Interventions: Self-care/ADL training;Energy conservation;DME and/or AE instruction;Therapeutic activities;Patient/family education;Balance training    OT Goals(Current goals can be found in the care plan section) Acute Rehab OT Goals Patient Stated Goal: get back home OT Goal Formulation: With patient/family Time For Goal Achievement: 12/27/22 Potential to Achieve Goals: Good ADL Goals Pt Will Perform  Grooming: with modified independence;standing Pt Will Perform Upper Body Dressing: with modified independence;sitting Pt Will Perform Lower Body Dressing: with modified independence;sit to/from stand Pt Will Transfer to Toilet: with modified independence;ambulating Pt Will Perform Toileting - Clothing Manipulation and hygiene: with modified independence;sit to/from stand  OT Frequency: Min 1X/week    Co-evaluation              AM-PAC OT "6 Clicks" Daily Activity     Outcome Measure Help from another person eating meals?: None Help from another person taking care of personal grooming?: A Little Help from another person toileting, which includes using toliet, bedpan, or urinal?: A Little Help from another person bathing (including washing, rinsing, drying)?: A Little Help from another person to put on and taking off regular upper body clothing?: A Little Help from another person to put on and taking off regular lower body clothing?: A Little 6 Click Score: 19   End of Session Equipment Utilized During Treatment: Gait belt;Rollator (4 wheels);Oxygen (4L) Nurse Communication: Mobility status;Precautions  Activity  Tolerance: Patient tolerated treatment well Patient left: in chair;with call bell/phone within reach;with chair alarm set;with family/visitor present  OT Visit Diagnosis: Other abnormalities of gait and mobility (R26.89);Muscle weakness (generalized) (M62.81)                Time: 1308-6578 OT Time Calculation (min): 28 min Charges:  OT General Charges $OT Visit: 1 Visit OT Evaluation $OT Eval Moderate Complexity: 1 Mod OT Treatments $Self Care/Home Management : 8-22 mins  Nyoka Cowden OTR/L Acute Rehabilitation Services Office: [phone]  Evern Bio [facility] 12/19/2022, 11:47 AM

## 2022-12-20 DIAGNOSIS — I10 Essential (primary) hypertension: Secondary | ICD-10-CM | POA: Diagnosis not present

## 2022-12-20 DIAGNOSIS — N3281 Overactive bladder: Secondary | ICD-10-CM

## 2022-12-20 DIAGNOSIS — I509 Heart failure, unspecified: Secondary | ICD-10-CM | POA: Diagnosis not present

## 2022-12-20 DIAGNOSIS — I739 Peripheral vascular disease, unspecified: Secondary | ICD-10-CM | POA: Diagnosis not present

## 2022-12-20 DIAGNOSIS — D509 Iron deficiency anemia, unspecified: Secondary | ICD-10-CM

## 2022-12-20 DIAGNOSIS — I5033 Acute on chronic diastolic (congestive) heart failure: Secondary | ICD-10-CM | POA: Diagnosis not present

## 2022-12-20 LAB — IRON AND TIBC
Iron: 29 ug/dL (ref 28–170)
Saturation Ratios: 10 % — ABNORMAL LOW (ref 10.4–31.8)
TIBC: 281 ug/dL (ref 250–450)
UIBC: 252 ug/dL

## 2022-12-20 LAB — BASIC METABOLIC PANEL
Anion gap: 13 (ref 5–15)
BUN: 13 mg/dL (ref 8–23)
CO2: 27 mmol/L (ref 22–32)
Calcium: 9.1 mg/dL (ref 8.9–10.3)
Chloride: 98 mmol/L (ref 98–111)
Creatinine, Ser: 1.09 mg/dL — ABNORMAL HIGH (ref 0.44–1.00)
GFR, Estimated: 46 mL/min — ABNORMAL LOW (ref >60)
Glucose, Bld: 88 mg/dL (ref 70–99)
Potassium: 3.6 mmol/L (ref 3.5–5.1)
Sodium: 138 mmol/L (ref 135–145)

## 2022-12-20 LAB — FERRITIN: Ferritin: 104 ng/mL (ref 11–307)

## 2022-12-20 LAB — GLUCOSE, CAPILLARY: Glucose-Capillary: 92 mg/dL (ref 70–99)

## 2022-12-20 LAB — TRANSFERRIN: Transferrin: 195 mg/dL (ref 192–382)

## 2022-12-20 LAB — MAGNESIUM: Magnesium: 1.9 mg/dL (ref 1.7–2.4)

## 2022-12-20 MED ORDER — IRON SUCROSE 200 MG IVPB - SIMPLE MED
200.0000 mg | Freq: Once | Status: DC
  Filled 2022-12-20: qty 110

## 2022-12-20 MED ORDER — SODIUM CHLORIDE 0.9 % IV SOLN
200.0000 mg | Freq: Once | INTRAVENOUS | Status: AC
  Administered 2022-12-20: 200 mg via INTRAVENOUS
  Filled 2022-12-20: qty 10

## 2022-12-20 MED ORDER — MAGNESIUM SULFATE 2 GM/50ML IV SOLN
2.0000 g | Freq: Once | INTRAVENOUS | Status: AC
  Administered 2022-12-20: 2 g via INTRAVENOUS
  Filled 2022-12-20: qty 50

## 2022-12-20 MED ORDER — SPIRONOLACTONE 12.5 MG HALF TABLET
12.5000 mg | ORAL_TABLET | Freq: Every day | ORAL | Status: DC
  Administered 2022-12-20 – 2022-12-23 (×4): 12.5 mg via ORAL
  Filled 2022-12-20 (×4): qty 1

## 2022-12-20 MED ORDER — POTASSIUM CHLORIDE CRYS ER 20 MEQ PO TBCR: 40.0000 meq | EXTENDED_RELEASE_TABLET | Freq: Once | ORAL | Status: DC

## 2022-12-20 MED ORDER — POTASSIUM CHLORIDE CRYS ER 20 MEQ PO TBCR
20.0000 meq | EXTENDED_RELEASE_TABLET | Freq: Once | ORAL | Status: AC
  Administered 2022-12-20: 20 meq via ORAL
  Filled 2022-12-20: qty 1

## 2022-12-20 NOTE — Plan of Care (Signed)
  Problem: Activity: Goal: Risk for activity intolerance will decrease Outcome: Progressing   Problem: Nutrition: Goal: Adequate nutrition will be maintained Outcome: Progressing   Problem: Pain Management: Goal: General experience of comfort will improve Outcome: Progressing

## 2022-12-20 NOTE — TOC Initial Note (Signed)
Transition of Care [facility]) - Initial/Assessment Note    Patient Details  Name: Kristen Pham MRN: [id number] Date of Birth: 1924/04/10  Transition of [facility]) CM/SW Contact:    Patrice Paradise, LCSW Phone Number: 12/20/2022, 4:09 PM  Clinical Narrative:                  CSW was alerted that pt's recs has changed from [facility] to SNF and speak with pt's POA daughter Elnita Maxwell. CSW spoke with Elnita Maxwell to discuss PT recommendation of a SNF. Elnita Maxwell was aware of recommendation and in agreement with going to a ST SNF. CSW discussed the SNF process.CSW informed Elnita Maxwell that she could visit the medicare.gov website for rating as well as a listing would be provided once bed offers come back.  Elnita Maxwell gave CSW permission to fax referrals out to local facilities.Elnita Maxwell informed CSW that first choice is [facility], then [facility] and next [facility]. Cherly was open to referral being sent to other facilities.CSW answered questions about the SNF process and the next steps in the process.  TOC team will continue to assist with discharge planning needs.      Expected Discharge Plan: Skilled Nursing Facility Barriers to Discharge: Insurance Authorization, SNF Pending bed offer   Patient Goals and CMS Choice   CMS Medicare.gov Compare Post Acute Care list provided to:: Patient Represenative (must comment) Elnita Maxwell daughter POA) Choice offered to / list presented to : [facility] POA / Guardian      Expected Discharge Plan and Services       Living arrangements for the past 2 months: Independent Living Facility                                      Prior Living Arrangements/Services Living arrangements for the past 2 months: Independent Living Facility Lives with:: Self Patient language and need for interpreter reviewed:: Yes          Care giver support system in place?: Yes (comment)      Activities of Daily Living   ADL Screening (condition at time of admission) Independently performs ADLs?:  No Does the patient have a NEW difficulty with bathing/dressing/toileting/self-feeding that is expected to last >3 days?: Yes (Initiates electronic notice to provider for possible OT consult) Does the patient have a NEW difficulty with getting in/out of bed, walking, or climbing stairs that is expected to last >3 days?: Yes (Initiates electronic notice to provider for possible PT consult) Does the patient have a NEW difficulty with communication that is expected to last >3 days?: No Is the patient deaf or have difficulty hearing?: Yes Does the patient have difficulty seeing, even when wearing glasses/contacts?: No Does the patient have difficulty concentrating, remembering, or making decisions?: No  Permission Sought/Granted      Share Information with NAME: Cherly  Permission granted to share info w AGENCY: SNFs  Permission granted to share info w Relationship: Daughter  Permission granted to share info w Contact Information: [phone]  Emotional Assessment Appearance:: Other (Comment Required (spoke with POA via phone) Attitude/Demeanor/Rapport: Unable to Assess Affect (typically observed): Unable to Assess Orientation: : Fluctuating Orientation (Suspected and/or reported Sundowners)      Admission diagnosis:  Acute CHF (congestive heart failure) (HCC) [I50.9] Acute respiratory failure with hypoxia (HCC) [J96.01] Acute congestive heart failure, unspecified heart failure type (HCC) [I50.9] Acute diastolic heart failure (HCC) [I50.31] Patient Active Problem List  Diagnosis Date Noted   Essential hypertension 12/18/2022   Anxiety with depression 12/18/2022   Anemia 12/18/2022   Acute on chronic diastolic CHF (congestive heart failure) (HCC) 12/18/2022   Acute respiratory failure (HCC) 12/18/2022   PAD (peripheral artery disease) (HCC) 04/21/2022   Bilateral pseudophakia 02/17/2021   Corneal edema of right eye 02/17/2021   Neuralgia 11/07/2018   Sensorineural hearing loss  (SNHL) of both ears 10/12/2018   Non-pressure chronic ulcer of other part of left foot limited to breakdown of skin (HCC) 01/05/2017   Acquired claw toe, left 10/07/2016   Hard corn 10/07/2016   Chronic toe pain, left foot 08/25/2016   Meniscal cyst 08/23/2012   Knee pain, bilateral 06/21/2012   Primary osteoarthritis of both knees 06/21/2012   Generalized osteoarthritis of hand 06/21/2012   PCP:  Creola Corn, MD Pharmacy:   [phone] - [place], [place] - [address] AT [facility] [address] [place] [address] Phone: [phone] Fax: [phone]  [facility]) - [place], [place] - [address] [facility] [address] [place] [address] [place] [address] Phone: [phone] Fax: [phone]  [facility] [address] [place] [address] Phone: [phone] Fax: [phone]     Social Determinants of Health (SDOH) Social History: SDOH Screenings   Food Insecurity: No Food Insecurity (12/18/2022)  Housing: Low Risk  (12/18/2022)  Transportation Needs: No Transportation Needs (12/18/2022)  Utilities: Not At Risk (12/18/2022)  Depression (PHQ2-9): Low Risk  (12/14/2022)  Recent Concern: Depression (PHQ2-9) - High Risk (10/15/2022)  Social Connections: Unknown (09/30/2021)   Received from [facility]  Tobacco Use: Low Risk  (12/18/2022)   SDOH Interventions:     Readmission Risk Interventions     No data to display

## 2022-12-20 NOTE — Progress Notes (Signed)
  Progress Note   Patient: Kristen Pham ZOX:[id number] DOB: Oct 07, 1924 DOA: 12/18/2022     2 DOS: the patient was seen and examined on 12/20/2022   Brief hospital course: Kristen Pham was admitted to the hospital with the working diagnosis of heart failure exacerbation.   89 yo female with the past medical history of heart failure, peripheral vascular disease, hypertension and depression who presented with dyspnea. Reported 4 days of worsening dyspnea, associated with lower extremity edema and weight gain. On the day of admission she was evaluated at her primary care office and was noted to have a 02 saturation of 83%, then she was referred to the ED for further evaluation.  On her initial physical examination her blood pressure was 154/92. HR 99, RR 25 and 02 saturation 95% on supplemental 02 per [place]. Lungs with rales bilaterally, heart with S1 and S2 present and regular with no gallops, ribs or murmurs, abdomen with no distention and positive lower extremity edema.   Chest radiograph with cardiomegaly, bilateral hilar vascular congestion with small bilateral pleural effusions.   12/01 patient with improvement in volume status. Very weak and deconditioned, will need SNF.   Assessment and Plan: * Acute on chronic diastolic CHF (congestive heart failure) (HCC) Echocardiogram with preserved LV systolic function with EF 55 to 60%, RV systolic function is preserved, LA with moderate dilatation, RA with mild dilatation, mild to moderate mitral valve regurgitation, mild aortic valve stenosis.   Urine output is 1,650  cc  Systolic blood pressure 150's mmHg Patient has lost about 3 Kg since admission.   Continue irbesartan and metoprolol.  Plan to transition to add spironolactone and transition to po furosemide.  Essential hypertension Continue blood pressure control with metoprolol and irbesartan.  Transition to po furosemide and add spironolactone.    PAD (peripheral artery disease)  (HCC) Continue atorvastatin.    Anemia Iron panel with serum iron 29, TIBC 281, transferrin saturation 10 and ferritin 104,  Consistent with anemia of iron deficiency  Will add IV iron today.    Chronic toe pain, left foot Continue pain control with tramadol.   Anxiety with depression Continue with escitalopram.         Subjective: Patient with improvement in dyspnea, she has no lower extremity edema, PND or orthopnea. Positive joint pain and stiffness consistent with RA.   Physical Exam: Vitals:   12/20/22 0728 12/20/22 0800 12/20/22 0819 12/20/22 1207  BP:   122/60 135/73  Pulse:   87 93  Resp: 19  16 20   Temp:    98.3 F (36.8 C)  TempSrc: Oral   Oral  SpO2:  99% 95% 91%  Weight:      Height:       Neurology awake and alert ENT With mild pallor Cardiovascular with S1 and S2 present and regular with no gallops, or rubs, positive murmur at the apex No JVD No lower extremity edema Respiratory with no rales or wheezing, no rhonchi Abdomen with no distention  Data Reviewed:    Family Communication: I spoke with patient's daughter at the bedside, we talked in detail about patient's condition, plan of care and prognosis and all questions were addressed.   Disposition: Status is: Inpatient Remains inpatient appropriate because: recovering from heart failure exacerbation, will need SNF   Planned Discharge Destination: Skilled nursing facility    Author: Coralie Keens, MD 12/20/2022 1:01 PM  For on call review [email or web address].

## 2022-12-20 NOTE — Progress Notes (Signed)
Mobility Specialist Progress Note:   12/20/22 1029  Mobility  Activity Ambulated with assistance in room  Level of Assistance Contact guard assist, steadying assist  Assistive Device Four wheel walker  Distance Ambulated (ft) 30 ft  Activity Response Tolerated well  Mobility Referral Yes  $Mobility charge 1 Mobility  Mobility Specialist Start Time (ACUTE ONLY) 1005  Mobility Specialist Stop Time (ACUTE ONLY) 1018  Mobility Specialist Time Calculation (min) (ACUTE ONLY) 13 min    Pt received in chair, agreeable to mobility.  Pt able to ambulate to door with slow shuffles. Slight posterior lean during ambulation resulting in unsteadiness but quickly corrected with CG assist. C/o of L. Knee pain and general weakness during session, otherwise asx throughout. VSS. Pt returned to chair with call bell in reach and all needs met. Daughter present in room.   Leory Plowman  Mobility Specialist Please contact via [employer] office at [phone]

## 2022-12-20 NOTE — NC FL2 (Signed)
[place] MEDICAID FL2 LEVEL OF CARE FORM     IDENTIFICATION  Patient Name: Kristen Pham Birthdate: May 16, 1924 Sex: female Admission Date (Current Location): 12/18/2022  [facility] [place] Number:  Producer, television/film/video and Address:  [facility] [place], [address] [place], [place] [address]      Provider Number: [id number]  Attending Physician Name and Address:  Coralie Keens,*  Relative Name and Phone Number:  Cherly [phone]    Current Level of Care: Hospital Recommended Level of Care: Skilled Nursing Facility Prior Approval Number:    Date Approved/Denied:   PASRR Number: [id number]  Discharge Plan: SNF    Current Diagnoses: Patient Active Problem List   Diagnosis Date Noted   Essential hypertension 12/18/2022   Anxiety with depression 12/18/2022   Anemia 12/18/2022   Acute on chronic diastolic CHF (congestive heart failure) (HCC) 12/18/2022   Acute respiratory failure (HCC) 12/18/2022   PAD (peripheral artery disease) (HCC) 04/21/2022   Bilateral pseudophakia 02/17/2021   Corneal edema of right eye 02/17/2021   Neuralgia 11/07/2018   Sensorineural hearing loss (SNHL) of both ears 10/12/2018   Non-pressure chronic ulcer of other part of left foot limited to breakdown of skin (HCC) 01/05/2017   Acquired claw toe, left 10/07/2016   Hard corn 10/07/2016   Chronic toe pain, left foot 08/25/2016   Meniscal cyst 08/23/2012   Knee pain, bilateral 06/21/2012   Primary osteoarthritis of both knees 06/21/2012   Generalized osteoarthritis of hand 06/21/2012    Orientation RESPIRATION BLADDER Height & Weight     Self, Time, Situation, Place (Forgetful)  Normal Incontinent Weight: 104 lb 4.4 oz (47.3 kg) Height:  5' (152.4 cm)  BEHAVIORAL SYMPTOMS/MOOD NEUROLOGICAL BOWEL NUTRITION STATUS      Continent Diet (See DC summary)  AMBULATORY STATUS COMMUNICATION OF NEEDS Skin   Limited Assist Verbally Normal                        Personal Care Assistance Level of Assistance  Bathing, Feeding, Dressing Bathing Assistance: Limited assistance Feeding assistance: Limited assistance Dressing Assistance: Limited assistance     Functional Limitations Info  Sight, Hearing, Speech Sight Info: Adequate Hearing Info: Impaired Speech Info: Adequate    SPECIAL CARE FACTORS FREQUENCY  PT (By licensed PT), OT (By licensed OT)     PT Frequency: 5x per week OT Frequency: 5x per week            Contractures Contractures Info: Not present    Additional Factors Info  Code Status, Allergies Code Status Info: Full Allergies Info: Ivp Dye (Iodinated Contrast Media)           Current Medications (12/20/2022):  This is the current hospital active medication list Current Facility-Administered Medications  Medication Dose Route Frequency Provider Last Rate Last Admin   acetaminophen (TYLENOL) tablet 650 mg  650 mg Oral Q4H PRN Sharlene Dory, DO       atorvastatin (LIPITOR) tablet 20 mg  20 mg Oral Daily Sharlene Dory, DO   20 mg at 12/20/22 0819   enoxaparin (LOVENOX) injection 30 mg  30 mg Subcutaneous Q24H Sharlene Dory, DO   30 mg at 12/19/22 2114   escitalopram (LEXAPRO) tablet 5 mg  5 mg Oral Daily Sharlene Dory, DO   5 mg at 12/20/22 8469   gabapentin (NEURONTIN) capsule 100 mg  100 mg Oral TID Sharlene Dory, DO   100 mg  at 12/20/22 1558   irbesartan (AVAPRO) tablet 150 mg  150 mg Oral Daily Sharlene Dory, DO   150 mg at 12/20/22 6962   metoprolol succinate (TOPROL-XL) 24 hr tablet 25 mg  25 mg Oral Daily Sharlene Dory, DO   25 mg at 12/20/22 0819   ondansetron (ZOFRAN) injection 4 mg  4 mg Intravenous Q6H PRN Sharlene Dory, DO       Oral care mouth rinse  15 mL Mouth Rinse PRN Sharlene Dory, DO       sodium chloride flush (NS) 0.9 % injection 3 mL  3 mL Intravenous Q12H Sharlene Dory, DO   3 mL at 12/20/22 0820    sodium chloride flush (NS) 0.9 % injection 3 mL  3 mL Intravenous PRN Sharlene Dory, DO       spironolactone (ALDACTONE) tablet 12.5 mg  12.5 mg Oral Daily Arrien, York Ram, MD   12.5 mg at 12/20/22 1405   traMADol (ULTRAM) tablet 50 mg  50 mg Oral Q6H PRN Sharlene Dory, DO   50 mg at 12/20/22 1101     Discharge Medications: Please see discharge summary for a list of discharge medications.  Relevant Imaging Results:  Relevant Lab Results:   Additional Information SS# [id number]  Patrice Paradise, LCSW

## 2022-12-20 NOTE — Progress Notes (Addendum)
Physical Therapy Treatment Patient Details Name: Kristen Pham MRN: [id number] DOB: 1924-11-14 Today's Date: 12/20/2022   History of Present Illness 89 y.o. female presents to [facility] 12/18/22 w/ SOB, weight gain, and BLE edema. Chest x-ray showed small pleural effusions and possible atelectasis vs. PNA. PMHx: HTN, HFpEF, PAD/atherosclerosis, anxiety/depression, chronic toe pain    PT Comments  Pt with steady progress with mobility. Gait improved when using shoes instead of non skid socks. Pt still not able to mobilize independently as she was able to do before admission. Pt off O2 throughout treatment and maintained SpO2 >92%. Will need more therapy before return to prior living situation. Patient will benefit from continued inpatient follow up therapy, <3 hours/day.     If plan is discharge home, recommend the following: A little help with walking and/or transfers;A little help with bathing/dressing/bathroom;Help with stairs or ramp for entrance;Assist for transportation   Can travel by private vehicle     Yes  Equipment Recommendations  None recommended by PT    Recommendations for Other Services       Precautions / Restrictions Precautions Precautions: Fall Precaution Comments: watch HR and O2 Restrictions Weight Bearing Restrictions: No     Mobility  Bed Mobility               General bed mobility comments: Up in recliner    Transfers Overall transfer level: Needs assistance Equipment used: Rollator (4 wheels) Transfers: Sit to/from Stand Sit to Stand: Min assist           General transfer comment: Assist to power up and stabilize.    Ambulation/Gait Ambulation/Gait assistance: Contact guard assist, Min assist Gait Distance (Feet): 100 Feet (10' x 1, 100' x 1) Assistive device: Rollator (4 wheels) Gait Pattern/deviations: Step-through pattern, Shuffle, Step-to pattern, Decreased stance time - left, Decreased step length - right, Decreased step length -  left, Trunk flexed, Narrow base of support Gait velocity: decr Gait velocity interpretation: <1.8 ft/sec, indicate of risk for recurrent falls   General Gait Details: Initially began amb in non skid socks pt with c/o pain in lt foot and had very short step with rt. Sat in chair and placed pt's shoes and socks on. Amb with shoes pt with improved gait pattern and able to perform some step through gait.   Stairs             Wheelchair Mobility     Tilt Bed    Modified Rankin (Stroke Patients Only)       Balance Overall balance assessment: Needs assistance Sitting-balance support: No upper extremity supported, Feet supported Sitting balance-Leahy Scale: Good     Standing balance support: Bilateral upper extremity supported, During functional activity, Reliant on assistive device for balance Standing balance-Leahy Scale: Poor Standing balance comment: rollator and CGA for static standing                            Cognition Arousal: Alert Behavior During Therapy: WFL for tasks assessed/performed Overall Cognitive Status: Within Functional Limits for tasks assessed                                          Exercises      General Comments General comments (skin integrity, edema, etc.): VSS on RA. SpO2 > 92% on RA with amb      Pertinent  Vitals/Pain Pain Assessment Pain Assessment: Faces Faces Pain Scale: Hurts little more Pain Location: lt foot Pain Descriptors / Indicators: Grimacing, Guarding Pain Intervention(s): Limited activity within patient's tolerance, Monitored during session    Home Living                          Prior Function            PT Goals (current goals can now be found in the care plan section) Acute Rehab PT Goals Patient Stated Goal: to get stronger Progress towards PT goals: Progressing toward goals    Frequency    Min 1X/week      PT Plan      Co-evaluation               AM-PAC PT "6 Clicks" Mobility   Outcome Measure  Help needed turning from your back to your side while in a flat bed without using bedrails?: None Help needed moving from lying on your back to sitting on the side of a flat bed without using bedrails?: A Little Help needed moving to and from a bed to a chair (including a wheelchair)?: A Little Help needed standing up from a chair using your arms (e.g., wheelchair or bedside chair)?: A Little Help needed to walk in hospital room?: A Little Help needed climbing 3-5 steps with a railing? : A Lot 6 Click Score: 18    End of Session Equipment Utilized During Treatment: Gait belt Activity Tolerance: Patient tolerated treatment well Patient left: in chair;with call bell/phone within reach Nurse Communication: Mobility status PT Visit Diagnosis: Unsteadiness on feet (R26.81);Muscle weakness (generalized) (M62.81)     Time: 1446-1510 PT Time Calculation (min) (ACUTE ONLY): 24 min  Charges:    $Gait Training: 23-37 mins PT General Charges $$ ACUTE PT VISIT: 1 Visit                     [facility] PT Acute Rehabilitation Services Office [phone]    Angelina Ok [facility] 12/20/2022, 3:54 PM

## 2022-12-20 NOTE — Progress Notes (Signed)
Mobility Specialist Progress Note:   12/20/22 1030  Mobility  Activity Transferred to/from [facility]  Level of Assistance Contact guard assist, steadying assist  Assistive Device  (HHA)  Distance Ambulated (ft) 3 ft  Activity Response Tolerated well  Mobility Referral Yes  $Mobility charge 1 Mobility  Mobility Specialist Start Time (ACUTE ONLY) 1025  Mobility Specialist Stop Time (ACUTE ONLY) 1030  Mobility Specialist Time Calculation (min) (ACUTE ONLY) 5 min    Pt received in chair requesting assistance to BR. HHA +2 to stand and pivot to [facility]. Void successful. Pt able to perform pericare independently. Pt returned to chair with all needs met.  Leory Plowman  Mobility Specialist Please contact via [employer] office at [phone]

## 2022-12-21 LAB — BASIC METABOLIC PANEL
Anion gap: 11 (ref 5–15)
BUN: 12 mg/dL (ref 8–23)
CO2: 26 mmol/L (ref 22–32)
Calcium: 9 mg/dL (ref 8.9–10.3)
Chloride: 97 mmol/L — ABNORMAL LOW (ref 98–111)
Creatinine, Ser: 0.95 mg/dL (ref 0.44–1.00)
GFR, Estimated: 55 mL/min — ABNORMAL LOW (ref >60)
Glucose, Bld: 115 mg/dL — ABNORMAL HIGH (ref 70–99)
Potassium: 4.1 mmol/L (ref 3.5–5.1)
Sodium: 134 mmol/L — ABNORMAL LOW (ref 135–145)

## 2022-12-21 LAB — MAGNESIUM: Magnesium: 2.2 mg/dL (ref 1.7–2.4)

## 2022-12-21 MED ORDER — DICLOFENAC SODIUM 1 % EX GEL
2.0000 g | Freq: Four times a day (QID) | CUTANEOUS | Status: DC
  Administered 2022-12-21 – 2022-12-23 (×9): 2 g via TOPICAL
  Filled 2022-12-21: qty 100

## 2022-12-21 MED ORDER — FUROSEMIDE 20 MG PO TABS
20.0000 mg | ORAL_TABLET | Freq: Every day | ORAL | Status: DC
  Administered 2022-12-21 – 2022-12-22 (×2): 20 mg via ORAL
  Filled 2022-12-21 (×2): qty 1

## 2022-12-21 NOTE — Progress Notes (Signed)
  Progress Note   Patient: Kristen Pham WUJ:[id number] DOB: 04-22-1924 DOA: 12/18/2022     3 DOS: the patient was seen and examined on 12/21/2022   Brief hospital course: Mrs. Sarao was admitted to the hospital with the working diagnosis of heart failure exacerbation.   89 yo female with the past medical history of heart failure, peripheral vascular disease, hypertension and depression who presented with dyspnea. Reported 4 days of worsening dyspnea, associated with lower extremity edema and weight gain. On the day of admission she was evaluated at her primary care office and was noted to have a 02 saturation of 83%, then she was referred to the ED for further evaluation.  On her initial physical examination her blood pressure was 154/92. HR 99, RR 25 and 02 saturation 95% on supplemental 02 per [place]. Lungs with rales bilaterally, heart with S1 and S2 present and regular with no gallops, ribs or murmurs, abdomen with no distention and positive lower extremity edema.   Chest radiograph with cardiomegaly, bilateral hilar vascular congestion with small bilateral pleural effusions.   12/01 patient with improvement in volume status. Very weak and deconditioned, will need SNF.  12/02 volume status has improved, now pending transfer to SNF.   Assessment and Plan: * Acute on chronic diastolic CHF (congestive heart failure) (HCC) Echocardiogram with preserved LV systolic function with EF 55 to 60%, RV systolic function is preserved, LA with moderate dilatation, RA with mild dilatation, mild to moderate mitral valve regurgitation, mild aortic valve stenosis.   Systolic blood pressure 120's mmHg Patient has lost about 5 Kg since admission.   Continue irbesartan, metoprolol, spironolactone and furosemide.     Essential hypertension Continue blood pressure control with metoprolol and irbesartan.  Diuresis with furosemide and spironolactone.    PAD (peripheral artery disease) (HCC) Continue  atorvastatin.    Anemia Iron panel with serum iron 29, TIBC 281, transferrin saturation 10 and ferritin 104,  Consistent with anemia of iron deficiency   12/01 IV iron sucrose 200 mg   Chronic toe pain, left foot Continue pain control with tramadol and topical diclofenac.   Anxiety with depression Continue with escitalopram.         Subjective: Patient is feeling better, no dyspnea or chest pain, no edema, PND or orthopnea.  She has right shoulder pain today, continue very weak and deconditioned   Physical Exam: Vitals:   12/21/22 0351 12/21/22 0722 12/21/22 0837 12/21/22 1115  BP: (!) 164/64 (!) 135/95 (!) 140/75 125/62  Pulse: 92 89 93 90  Resp: 20 18 17 19   Temp: 98.6 F (37 C) 98.9 F (37.2 C)  98.3 F (36.8 C)  TempSrc: Oral Oral  Oral  SpO2: 97% 99% 96% 95%  Weight: 45.2 kg     Height:       Neurology awake and alert ENT with mild pallor Cardiovascular with S1 and S2 present and regular, positive systolic murmur at the apex No JVD No lower extremity edema Respiratory with no rales or wheezing, no rhonchi Abdomen with no distention  Data Reviewed:    Family Communication: no family at the bedside   Disposition: Status is: Inpatient Remains inpatient appropriate because: heart failure pending transfer to SNF  Planned Discharge Destination: Skilled nursing facility      Author: Coralie Keens, MD 12/21/2022 2:42 PM  For on call review [email or web address].

## 2022-12-21 NOTE — TOC Progression Note (Signed)
Transition of Care [facility]) - Progression Note    Patient Details  Name: Kristen Pham MRN: [id number] Date of Birth: 02-01-1924  Transition of [facility]) CM/SW Contact  Michaela Corner, [place] Phone Number: 12/21/2022, 12:22 PM  Clinical Narrative:   CSW met pts dtr, Elnita Maxwell, and gave her Medicare.gov ratings list for accepting SNFs. Elnita Maxwell needs time to decide on placement. CSW reached out to [facility] on if they can accept pt - waiting to hear back. CSW to f/u tomorrow with Elnita Maxwell on placement decision.            Skilled Nursing Rehab Facilities-   [email or web address]     Ratings out of 5 stars (5 the highest)    Name Address  Phone # Quality Care Staffing Health Inspection Overall  [facility] [address] [phone] 2 2 5 5   [facility] [address], [place] [phone] 4 2 4 4   [facility] [address], [facility] [phone] 2 1 2 1   [facility] [address] [place] [phone] 3 1 4 3   [facility]  [address], [place] [phone] 4 4 5 5   [facility] [address] [facility] [phone] [facility] [address], [place] [phone] 5 1 2 2   [facility] [address], [place] [phone] 1 1 3 1   [address] ([place]) [address] [place] [phone] 2 2 2 2   [facility] [address] [place], [place] [phone] 2 2 1 1   [facility] [address], [place] [phone] 3 1 1 1   [facility] [address] [place] [phone] 3 3 4 4   [facility] [address], [place] [phone] 2 2 3 3                  [facility] [address], [place] [phone] 4 2 1 1   [address] [place] [address], [place] [phone] 1 1 2 1   [facility] [address] [place] [phone] 2 1 4 3   [facility]  [address] [place] [phone] 2 1 4 3   [facility] [address] [place] [phone] 2 3 3 3                  [address] (no [facility]) [address], [place] [phone] 4 5 5 5   [facility] (No [employer]) [address] [place], [place] [phone] 1 2 4 3   [facility] [address], [place] [phone] 2 1 2 1   [facility] (No [employer]) [address], [place] [phone] 4 1 5 4   [facility] [address], [facility] [phone] 3 4 2 2   [facility] [address], [place] [phone] 2 1 1 1   [facility] [address] [place] [phone] 4 2 5 5   [facility]  [address] [place] [phone] 2 2 3 3   [facility] [address], [place] [phone] 4 1 1 1   [facility] [address] [place], [place] [phone] 2 2 3 3                  [facility] [address], [place] [phone] 2 1 1 1   [facility] [address], [place] Clines  [phone] 3 3 3 3   [facility] (No Hum[employer]) [address] [place] [phone] 2 2 4 4   [facility]) [address], [place]  [phone] 2 1 1 1   [facility] [address], [place] [phone] 4 3 5 5   [facility] [address], [place] [phone] 1 1 1 1                  [facility] [address] [place], [place] [phone] 5 4 5 5   [facility])  [address] [place] [phone] 1 1 2 1   [facility]) [address], [place] [phone]   2 4 4   [facility] [address], [place] [phone] 3 5 5 5   [address] [place], [place] [phone] 4 2 2 2   [facility]) [address] [place] [phone] 2 1 3 2        Expected Discharge Plan: Skilled Nursing Facility Barriers to Discharge: Insurance Authorization, SNF Pending bed offer  Expected Discharge Plan and Services       Living  arrangements for the past 2 months: Independent Living Facility                                       Social Determinants of Health (SDOH) Interventions SDOH Screenings   Food Insecurity: No Food Insecurity (12/18/2022)  Housing: Low Risk  (12/18/2022)  Transportation Needs: No Transportation Needs (12/18/2022)  Utilities: Not At Risk (12/18/2022)  Depression (PHQ2-9): Low Risk  (12/14/2022)  Recent Concern: Depression (PHQ2-9) - High Risk (10/15/2022)  Social Connections: Unknown (09/30/2021)   [facility] from [facility], [facility]  Tobacco Use: Low Risk  (12/18/2022)    Readmission Risk Interventions     No data to display

## 2022-12-21 NOTE — Care Management Important Message (Signed)
Important Message  Patient Details  Name: Kristen Pham MRN: [id number] Date of Birth: 1924/12/22   Important Message Given:  Yes - Medicare IM     Dorena Bodo 12/21/2022, 3:32 PM

## 2022-12-21 NOTE — Progress Notes (Signed)
Heart Failure Navigator Progress Note  Assessed for Heart & Vascular TOC clinic readiness.  Patient does not meet criteria due to EF 55-60%, No HF TOC per Dr. Ella Jubilee. .   Navigator will sign off at this time.   Rhae Hammock, BSN, Scientist, clinical (histocompatibility and immunogenetics) Only

## 2022-12-21 NOTE — Plan of Care (Signed)

## 2022-12-21 NOTE — Progress Notes (Signed)
Mobility Specialist Progress Note:    12/21/22 1124  Mobility  Activity Ambulated with assistance in room  Level of Assistance Minimal assist, patient does 75% or more  Assistive Device Four wheel walker  Activity Response Tolerated well  Mobility Referral Yes  $Mobility charge 1 Mobility  Mobility Specialist Start Time (ACUTE ONLY) 0955  Mobility Specialist Stop Time (ACUTE ONLY) 1009  Mobility Specialist Time Calculation (min) (ACUTE ONLY) 14 min   Pt received in chair agreeable to mobility. Pt needed MinA throughout session. Attempted to take a couple steps forwards but pt had a strong posterior lean. Was able to performed x2 STS, marching in place, and shifting weight for L to R. Situated back in chair w/ call bell and personal belongings in reach. All needs met w/ chair alarm on.   Thompson Grayer Mobility Specialist  Please contact vis Secure Chat or  Rehab Office [phone]

## 2022-12-22 LAB — RENAL FUNCTION PANEL
Albumin: 2.9 g/dL — ABNORMAL LOW (ref 3.5–5.0)
Anion gap: 10 (ref 5–15)
BUN: 14 mg/dL (ref 8–23)
CO2: 25 mmol/L (ref 22–32)
Calcium: 8.7 mg/dL — ABNORMAL LOW (ref 8.9–10.3)
Chloride: 97 mmol/L — ABNORMAL LOW (ref 98–111)
Creatinine, Ser: 0.97 mg/dL (ref 0.44–1.00)
GFR, Estimated: 53 mL/min — ABNORMAL LOW (ref >60)
Glucose, Bld: 121 mg/dL — ABNORMAL HIGH (ref 70–99)
Phosphorus: 3 mg/dL (ref 2.5–4.6)
Potassium: 4.2 mmol/L (ref 3.5–5.1)
Sodium: 132 mmol/L — ABNORMAL LOW (ref 135–145)

## 2022-12-22 MED ORDER — TRAMADOL HCL 50 MG PO TABS: 50.0000 mg | ORAL_TABLET | Freq: Four times a day (QID) | ORAL | 0 refills | Status: DC | PRN

## 2022-12-22 MED ORDER — METOPROLOL SUCCINATE ER 25 MG PO TB24: 25.0000 mg | ORAL_TABLET | Freq: Every day | ORAL | 0 refills | Status: DC

## 2022-12-22 MED ORDER — SPIRONOLACTONE 25 MG PO TABS: 12.5000 mg | ORAL_TABLET | Freq: Every day | ORAL | 0 refills | Status: DC

## 2022-12-22 MED ORDER — IRBESARTAN 150 MG PO TABS: 150.0000 mg | ORAL_TABLET | Freq: Every day | ORAL | 0 refills | Status: DC

## 2022-12-22 MED ORDER — FUROSEMIDE 20 MG PO TABS: 20.0000 mg | ORAL_TABLET | Freq: Every day | ORAL | 0 refills | Status: AC | PRN

## 2022-12-22 MED ORDER — GABAPENTIN 100 MG PO CAPS: 100.0000 mg | ORAL_CAPSULE | Freq: Two times a day (BID) | ORAL | Status: DC

## 2022-12-22 MED ORDER — FUROSEMIDE 20 MG PO TABS: 20.0000 mg | ORAL_TABLET | Freq: Every day | ORAL | Status: DC | PRN

## 2022-12-22 MED ORDER — DICLOFENAC SODIUM 1 % EX GEL: 2.0000 g | Freq: Four times a day (QID) | CUTANEOUS | 0 refills | Status: AC

## 2022-12-22 NOTE — Progress Notes (Signed)
Patient care coordinator Kristen Pham educated about tele sitter/bedside sitter hospital policy. She refused sitter option. She informed me that she will continue to look for family/care giver that can monitor patient over night. Patient currently resting in bed.

## 2022-12-22 NOTE — Progress Notes (Signed)
Occupational Therapy Treatment Patient Details Name: Kristen Pham MRN: [id number] DOB: 12-13-1924 Today's Date: 12/22/2022   History of present illness 89 y.o. female presents to [facility] 12/18/22 w/ SOB, weight gain, and BLE edema. Chest x-ray showed small pleural effusions and possible atelectasis vs. PNA. PMHx: HTN, HFpEF, PAD/atherosclerosis, anxiety/depression, chronic toe pain   OT comments  Pt requiring increased physical assistance due to interference from arthritic pain at multiple joints. Pt required Mod A for bed mobility, LB bathing and in-room mobility using personal Rollator. Due to pain and weakness, pt also required assistance to manage Rollator brakes. Unless ALF staff able to provide the assistance pt currently needed, recommend consideration of continued inpatient follow up therapy, <3 hours/day at DC.        If plan is discharge home, recommend the following:  A little help with walking and/or transfers;A little help with bathing/dressing/bathroom;Assist for transportation;Help with stairs or ramp for entrance   Equipment Recommendations  None recommended by OT    Recommendations for Other Services      Precautions / Restrictions Precautions Precautions: Fall Precaution Comments: monitor HR Restrictions Weight Bearing Restrictions: No       Mobility Bed Mobility Overal bed mobility: Needs Assistance Bed Mobility: Supine to Sit     Supine to sit: Mod assist, HOB elevated     General bed mobility comments: assist to lift trunk and scoot hips to EOB    Transfers Overall transfer level: Needs assistance Equipment used: Rollator (4 wheels) Transfers: Sit to/from Stand Sit to Stand: Mod assist, Min assist           General transfer comment: required assist to lock rollator brakes     Balance Overall balance assessment: Needs assistance Sitting-balance support: No upper extremity supported, Feet supported Sitting balance-Leahy Scale: Good      Standing balance support: Bilateral upper extremity supported, During functional activity, Reliant on assistive device for balance Standing balance-Leahy Scale: Poor                             ADL either performed or assessed with clinical judgement   ADL Overall ADL's : Needs assistance/impaired     Grooming: Set up;Sitting;Wash/dry face       Lower Body Bathing: Moderate assistance;Sit to/from stand;Sitting/lateral leans Lower Body Bathing Details (indicate cue type and reason): able to bathe upper LE sititing EOB. Required assist for balance in standing for anterior peri care, Total A for posterior peri care and lower LE bathing Upper Body Dressing : Minimal assistance;Sitting Upper Body Dressing Details (indicate cue type and reason): assist to don gown around back due to shoulder pain                 Functional mobility during ADLs: Moderate assistance;Rollator (4 wheels) General ADL Comments: Limited by arthritic pain, motivated to attempt mobility in room to recliner using Rollator though significant posterior bias and DME mgmt needed    Extremity/Trunk Assessment Upper Extremity Assessment Upper Extremity Assessment: Generalized weakness;Right hand dominant;RUE deficits/detail RUE Deficits / Details: arthritic pain, limited reach with this UE. poor grasp for Rollator brake mgmt   Lower Extremity Assessment Lower Extremity Assessment: Defer to PT evaluation        Vision   Vision Assessment?: No apparent visual deficits   Perception     Praxis      Cognition Arousal: Alert Behavior During Therapy: WFL for tasks assessed/performed Overall Cognitive Status: Within Functional Limits for  tasks assessed                                 General Comments: HOH        Exercises Exercises: Other exercises Other Exercises Other Exercises: lap slides B UE    Shoulder Instructions       General Comments      Pertinent Vitals/  Pain       Pain Assessment Pain Assessment: Faces Faces Pain Scale: Hurts even more Pain Location: B knees, L shoulder (arthritic pain) Pain Descriptors / Indicators: Grimacing, Guarding Pain Intervention(s): Monitored during session, Limited activity within patient's tolerance, Heat applied, Other (comment) (Rn applied voltaren gel)  Home Living                                          Prior Functioning/Environment              Frequency  Min 1X/week        Progress Toward Goals  OT Goals(current goals can now be found in the care plan section)  Progress towards OT goals: OT to reassess next treatment  Acute Rehab OT Goals Patient Stated Goal: manage arthritic pain, get back to work with therapy at home OT Goal Formulation: With patient/family Time For Goal Achievement: 12/27/22 Potential to Achieve Goals: Good ADL Goals Pt Will Perform Grooming: with modified independence;standing Pt Will Perform Upper Body Dressing: with modified independence;sitting Pt Will Perform Lower Body Dressing: with modified independence;sit to/from stand Pt Will Transfer to Toilet: with modified independence;ambulating Pt Will Perform Toileting - Clothing Manipulation and hygiene: with modified independence;sit to/from stand  Plan      Co-evaluation                 AM-PAC OT "6 Clicks" Daily Activity     Outcome Measure   Help from another person eating meals?: None Help from another person taking care of personal grooming?: A Little Help from another person toileting, which includes using toliet, bedpan, or urinal?: A Little Help from another person bathing (including washing, rinsing, drying)?: A Little Help from another person to put on and taking off regular upper body clothing?: A Little Help from another person to put on and taking off regular lower body clothing?: A Little 6 Click Score: 19    End of Session Equipment Utilized During Treatment: Gait  belt;Rollator (4 wheels)  OT Visit Diagnosis: Other abnormalities of gait and mobility (R26.89);Muscle weakness (generalized) (M62.81)   Activity Tolerance Patient tolerated treatment well;Patient limited by pain   Patient Left in chair;with call bell/phone within reach;with chair alarm set   Nurse Communication Mobility status        Time: 8841-6606 OT Time Calculation (min): 30 min  Charges: OT General Charges $OT Visit: 1 Visit OT Treatments $Self Care/Home Management : 8-22 mins $Therapeutic Activity: 8-22 mins  Bradd Canary, OTR/L Acute Rehab Services Office: [phone]   Kristen Pham 12/22/2022, 9:03 AM

## 2022-12-22 NOTE — Progress Notes (Signed)
Patient had an unwitnessed fall out the bed. No signs of injuries. Vitals documented below. No complaints of pain. Family notified. MD notified. Patient currently resting in bed.   Vitals:   12/22/22 0343 12/22/22 0752 12/22/22 1015 12/22/22 1608  BP: 135/63 133/71  (!) 140/58  Pulse: 71 95 97 97  Resp: 13 18 15 19   Temp: 98.4 F (36.9 C) 98.1 F (36.7 C)  99.1 F (37.3 C)  TempSrc: Oral Oral  Oral  SpO2: 94% 100% 98% 91%  Weight: 46.6 kg     Height:

## 2022-12-22 NOTE — Discharge Summary (Signed)
Physician Discharge Summary   Patient: Kristen Pham MRN: [id number] DOB: Aug 11, 1924  Admit date:     12/18/2022  Discharge date: 12/22/22  Discharge Physician: Kristen Pham   PCP: Kristen Corn, MD   Recommendations at discharge:    Patient has been placed on irbesartan, metoprolol and spironolactone.  Furosemide as needed for signs of volume overload, weight increae 2 to 3 lbs in 24 hrs or 5 lbs in 7 days.  Follow up renal function and electrolytes in 7 days as outpatient.  Follow up with Kristen Pham in 7 to 10 days.   Discharge Diagnoses: Principal Problem:   Acute on chronic diastolic CHF (congestive heart failure) (HCC) Active Problems:   Essential hypertension   PAD (peripheral artery disease) (HCC)   Anemia   Chronic toe pain, left foot   Anxiety with depression  Resolved Problems:   Acute CHF (congestive heart failure) (HCC)   OAB (overactive bladder)  Hospital Course: Kristen Pham was admitted to the hospital with the working diagnosis of heart failure exacerbation.   89 yo female with the past medical history of heart failure, peripheral vascular disease, hypertension and depression who presented with dyspnea. Reported 4 days of worsening dyspnea, associated with lower extremity edema and weight gain. On the day of admission she was evaluated at her primary care office and was noted to have a 02 saturation of 83%, then she was referred to the ED for further evaluation.  On her initial physical examination her blood pressure was 154/92. HR 99, RR 25 and 02 saturation 95% on supplemental 02 per [place]. Lungs with rales bilaterally, heart with S1 and S2 present and regular with no gallops, ribs or murmurs, abdomen with no distention and positive lower extremity edema.   Na 137, K 4,0 Cl 102, bicarbonate 26 glucose 113, bun 25 cr 0,96  BNP 739  High sensitive troponin 21  Wbc 9,0 hgb 11.1 plt 287  Sars covid 19 negative  Chest radiograph with cardiomegaly,  bilateral hilar vascular congestion with small bilateral pleural effusions.   EKG 77 bpm, normal axis, normal intervals, sinus rhythm with PAC, no significant ST segment or T wave changes.   Echocardiogram with preserved LV systolic function with mild to moderate mitral regurgitation.   12/01 patient with improvement in volume status. Very weak and deconditioned, will need SNF.  12/02 volume status has improved, now pending transfer to SNF.  12/03 plan to transfer to SNF to continue physical therapy.   Assessment and Plan: * Acute on chronic diastolic CHF (congestive heart failure) (HCC) Echocardiogram with preserved LV systolic function with EF 55 to 60%, RV systolic function is preserved, LA with moderate dilatation, RA with mild dilatation, mild to moderate mitral valve regurgitation, mild aortic valve stenosis.   Patient was placed on furosemide for diuresis, negative fluid balance was achieved, -4,918 ml with significant improvement in her symptoms.  Patient has lost about 5 Kg since admission.   Continue irbesartan, metoprolol, and spironolactone. Use furosemide as needed for signs of volume overload.    Hold on SGLT 2 inh for now due risk renal failure and urinary tract infections.   Essential hypertension Continue blood pressure control with metoprolol and irbesartan.  Diuresis spironolactone. Will use furosemide as needed for volume overload.     PAD (peripheral artery disease) (HCC) Continue atorvastatin.    Anemia Iron panel with serum iron 29, TIBC 281, transferrin saturation 10 and ferritin 104,  Consistent with anemia of iron deficiency   12/01  IV iron sucrose 200 mg   Chronic toe pain, left foot Continue pain control with tramadol and topical diclofenac.   Anxiety with depression Continue with escitalopram.          Consultants: none  Procedures performed: none   Disposition: Skilled nursing facility Diet recommendation:  Discharge Diet Orders (From  admission, onward)     Start     Ordered   12/22/22 0000  Diet - low sodium heart healthy        12/22/22 1405           Cardiac diet DISCHARGE MEDICATION: Allergies as of 12/22/2022       Reactions   Ivp Dye [iodinated Contrast Media]    Felt confused, disoriented        Medication List     STOP taking these medications    amLODipine 10 MG tablet Commonly known as: NORVASC   atenolol 50 MG tablet Commonly known as: TENORMIN   ibuprofen 200 MG tablet Commonly known as: ADVIL       TAKE these medications    acetaminophen 500 MG tablet Commonly known as: TYLENOL Take 500 mg by mouth at bedtime.   atorvastatin 20 MG tablet Commonly known as: LIPITOR Take 1 tablet (20 mg total) by mouth daily.   diclofenac Sodium 1 % Gel Commonly known as: VOLTAREN Apply 2 g topically 4 (four) times daily. Apply to right shoulder.   escitalopram 5 MG tablet Commonly known as: LEXAPRO Take 5 mg by mouth daily.   furosemide 20 MG tablet Commonly known as: LASIX Take 1 tablet (20 mg total) by mouth daily as needed for fluid or edema.   gabapentin 100 MG capsule Commonly known as: NEURONTIN Take 1 capsule (100 mg total) by mouth 2 (two) times daily.   irbesartan 150 MG tablet Commonly known as: AVAPRO Take 1 tablet (150 mg total) by mouth daily. Start taking on: December 23, 2022   MAGNESIUM PO Take 1 tablet by mouth daily as needed.   metoprolol succinate 25 MG 24 hr tablet Commonly known as: TOPROL-XL Take 1 tablet (25 mg total) by mouth daily. Start taking on: December 23, 2022   NONFORMULARY OR COMPOUNDED ITEM Take 1 drop by mouth daily. CBD Drops.   spironolactone 25 MG tablet Commonly known as: ALDACTONE Take 0.5 tablets (12.5 mg total) by mouth daily. Start taking on: December 23, 2022   traMADol 50 MG tablet Commonly known as: ULTRAM Take 1 tablet (50 mg total) by mouth every 6 (six) hours as needed.        Contact information for  after-discharge care     Destination     [facility]: Kristen Pham, [place] Preferred SNF .   Service: Skilled Nursing Contact information: [address] [place] [address] [phone]                    Discharge Exam: Ceasar Mons Weights   12/20/22 0548 12/21/22 0351 12/22/22 0343  Weight: 47.3 kg 45.2 kg 46.6 kg   BP 133/71 (BP Location: Right Arm)   Pulse 97   Temp 98.1 F (36.7 C) (Oral)   Resp 15   Ht 5' (1.524 m)   Wt 46.6 kg   SpO2 98%   BMI 20.06 kg/m   Patient is feeling better, she has no dyspnea, PND, or orthopnea. Continue very weak and deconditioned.  Neurology awake and alert, her cranial nerves 2 to 12 are intact, strength is symmetrical 5/5 all four  extremities, her coordination is limited due to arthritis, she has no disorganized thinking, or lack of attention  ENT with mild pallor Cardiovascular with S1 and S2 present, irregular, no gallops or rubs Abdomen with no distention  No  lower extremity edema   Condition at discharge: stable  The results of significant diagnostics from this hospitalization (including imaging, microbiology, ancillary and laboratory) are listed below for reference.   Imaging Studies: ECHOCARDIOGRAM COMPLETE  Result Date: 12/19/2022    ECHOCARDIOGRAM REPORT   Patient Name:   CARDEN KRIZMAN Date of Exam: 12/19/2022 Medical Rec #:  [id number]         Height:       60.0 in Accession #:    [id number]        Weight:       110.2 lb Date of Birth:  [birth year]          BSA:          1.449 m Patient Age:    89 years          BP:           154/74 mmHg Patient Gender: F                 HR:           76 bpm. Exam Location:  Inpatient Procedure: 2D Echo, 3D Echo, Strain Analysis, Color Doppler and Cardiac Doppler Indications:    CHF-Acute Diastolic  History:        Patient has prior history of Echocardiogram examinations, most                 recent 04/11/2020. PAD, Signs/Symptoms:Shortness of Breath; Risk                  Factors:Hypertension. Pleural Effusion.  Sonographer:    Raeford Razor RDCS Referring Phys: [id number] [facility] IMPRESSIONS  1. Left ventricular ejection fraction, by estimation, is 55 to 60%. The left ventricle has normal function. The left ventricle has no regional wall motion abnormalities. Left ventricular diastolic parameters are consistent with Grade II diastolic dysfunction (pseudonormalization). The average left ventricular global longitudinal strain is -19.0 %. The global longitudinal strain is normal.  2. Right ventricular systolic function is normal. The right ventricular size is normal. Tricuspid regurgitation signal is inadequate for assessing PA pressure.  3. Left atrial size was moderately dilated.  4. Right atrial size was mildly dilated.  5. The mitral valve is degenerative. Mild to moderate mitral valve regurgitation. No evidence of mitral stenosis.  6. The aortic valve is calcified. Aortic valve regurgitation is mild. Mild aortic valve stenosis. Aortic valve area, by VTI measures 1.10 cm. Aortic valve mean gradient measures 14.0 mmHg. Aortic valve Vmax measures 2.47 m/s.  7. The inferior vena cava is normal in size with greater than 50% respiratory variability, suggesting right atrial pressure of 3 mmHg. Comparison(s): No significant change from prior study. FINDINGS  Left Ventricle: Left ventricular ejection fraction, by estimation, is 55 to 60%. The left ventricle has normal function. The left ventricle has no regional wall motion abnormalities. The average left ventricular global longitudinal strain is -19.0 %. The global longitudinal strain is normal. The left ventricular internal cavity size was normal in size. There is no left ventricular hypertrophy. Left ventricular diastolic parameters are consistent with Grade II diastolic dysfunction (pseudonormalization). Right Ventricle: The right ventricular size is normal. No increase in right ventricular wall thickness. Right  ventricular systolic function is normal. Tricuspid regurgitation  signal is inadequate for assessing PA pressure. Left Atrium: Left atrial size was moderately dilated. Right Atrium: Right atrial size was mildly dilated. Pericardium: There is no evidence of pericardial effusion. Mitral Valve: The mitral valve is degenerative in appearance. Mild to moderate mitral annular calcification. Mild to moderate mitral valve regurgitation. No evidence of mitral valve stenosis. Tricuspid Valve: The tricuspid valve is grossly normal. Tricuspid valve regurgitation is mild . No evidence of tricuspid stenosis. Aortic Valve: The aortic valve is calcified. Aortic valve regurgitation is mild. Mild aortic stenosis is present. Aortic valve mean gradient measures 14.0 mmHg. Aortic valve peak gradient measures 24.4 mmHg. Aortic valve area, by VTI measures 1.10 cm. Pulmonic Valve: The pulmonic valve was grossly normal. Pulmonic valve regurgitation is not visualized. No evidence of pulmonic stenosis. Aorta: The aortic root and ascending aorta are structurally normal, with no evidence of dilitation. Venous: The inferior vena cava is normal in size with greater than 50% respiratory variability, suggesting right atrial pressure of 3 mmHg. IAS/Shunts: The atrial septum is grossly normal. Additional Comments: There is a small pleural effusion in the left lateral region.  LEFT VENTRICLE PLAX 2D LVIDd:         4.30 cm   Diastology LVIDs:         3.10 cm   LV e' medial:    7.72 cm/s LV PW:         0.90 cm   LV E/e' medial:  20.7 LV IVS:        0.90 cm   LV e' lateral:   7.07 cm/s LVOT diam:     1.80 cm   LV E/e' lateral: 22.6 LV SV:         67 LV SV Index:   46        2D Longitudinal Strain LVOT Area:     2.54 cm  2D Strain GLS Avg:     -19.0 %                           3D Volume EF:                          3D EF:        52 %                          LV EDV:       108 ml                          LV ESV:       52 ml                          LV SV:         57 ml RIGHT VENTRICLE             IVC RV Basal diam:  2.90 cm     IVC diam: 1.60 cm RV S prime:     13.50 cm/s TAPSE (M-mode): 2.1 cm LEFT ATRIUM             Index        RIGHT ATRIUM           Index LA diam:        3.50 cm 2.42 cm/m   RA Area:  19.50 cm LA Vol (A2C):   52.8 ml 36.44 ml/m  RA Volume:   61.80 ml  42.65 ml/m LA Vol (A4C):   66.1 ml 45.62 ml/m LA Biplane Vol: 59.9 ml 41.34 ml/m  AORTIC VALVE AV Area (Vmax):    0.97 cm AV Area (Vmean):   0.96 cm AV Area (VTI):     1.10 cm AV Vmax:           247.00 cm/s AV Vmean:          175.000 cm/s AV VTI:            0.609 m AV Peak Grad:      24.4 mmHg AV Mean Grad:      14.0 mmHg LVOT Vmax:         94.40 cm/s LVOT Vmean:        66.300 cm/s LVOT VTI:          0.264 m LVOT/AV VTI ratio: 0.43  AORTA Ao Root diam: 2.70 cm Ao Asc diam:  3.40 cm MITRAL VALVE MV Area (PHT): 6.12 cm     SHUNTS MV Decel Time: 124 msec     Systemic VTI:  0.26 m MV E velocity: 160.00 cm/s  Systemic Diam: 1.80 cm MV A velocity: 122.00 cm/s MV E/A ratio:  1.31 Lennie Odor MD Electronically signed by Lennie Odor MD Signature Date/Time: 12/19/2022/12:34:49 PM    Final    DG Chest Port 1 View  Result Date: 12/18/2022 CLINICAL DATA:  Shortness of breath EXAM: PORTABLE CHEST 1 VIEW COMPARISON:  04/04/2016 FINDINGS: Heart size is mildly enlarged. Aortic atherosclerosis. Small bilateral pleural effusions with associated bibasilar opacities. No pneumothorax. IMPRESSION: Small bilateral pleural effusions with associated bibasilar opacities, which may represent atelectasis or pneumonia. Electronically Signed   By: Duanne Guess D.O.   On: 12/18/2022 12:23    Microbiology: Results for orders placed or performed during the hospital encounter of 12/18/22  Resp panel by RT-PCR (RSV, Flu A&B, Covid) Anterior Nasal Swab     Status: None   Collection Time: 12/18/22 12:18 PM   Specimen: Anterior Nasal Swab  Result Value Ref Range Status   SARS Coronavirus 2 by RT PCR  NEGATIVE NEGATIVE Final    Comment: (NOTE) SARS-CoV-2 target nucleic acids are NOT DETECTED.  The SARS-CoV-2 RNA is generally detectable in upper respiratory specimens during the acute phase of infection. The lowest concentration of SARS-CoV-2 viral copies this assay can detect is 138 copies/mL. A negative result does not preclude SARS-Cov-2 infection and should not be used as the sole basis for treatment or other patient management decisions. A negative result may occur with  improper specimen collection/handling, submission of specimen other than nasopharyngeal swab, presence of viral mutation(s) within the areas targeted by this assay, and inadequate number of viral copies(<138 copies/mL). A negative result must be combined with clinical observations, patient history, and epidemiological information. The expected result is Negative.  Fact Sheet for Patients:  BloggerCourse.com  Fact Sheet for Healthcare Providers:  SeriousBroker.it  This test is no t yet approved or cleared by the [place] FDA and  has been authorized for detection and/or diagnosis of SARS-CoV-2 by FDA under an Emergency Use Authorization (EUA). This EUA will remain  in effect (meaning this test can be used) for the duration of the COVID-19 declaration under Section 564(b)(1) of the Act, 21 U.S.C.section 360bbb-3(b)(1), unless the authorization is terminated  or revoked sooner.       Influenza A by PCR NEGATIVE NEGATIVE  Final   Influenza B by PCR NEGATIVE NEGATIVE Final    Comment: (NOTE) The Xpert Xpress SARS-CoV-2/FLU/RSV plus assay is intended as an aid in the diagnosis of influenza from Nasopharyngeal swab specimens and should not be used as a sole basis for treatment. Nasal washings and aspirates are unacceptable for Xpert Xpress SARS-CoV-2/FLU/RSV testing.  Fact Sheet for Patients: BloggerCourse.com  Fact Sheet for  Healthcare Providers: SeriousBroker.it  This test is not yet approved or cleared by the [place] FDA and has been authorized for detection and/or diagnosis of SARS-CoV-2 by FDA under an Emergency Use Authorization (EUA). This EUA will remain in effect (meaning this test can be used) for the duration of the COVID-19 declaration under Section 564(b)(1) of the Act, 21 U.S.C. section 360bbb-3(b)(1), unless the authorization is terminated or revoked.     Resp Syncytial Virus by PCR NEGATIVE NEGATIVE Final    Comment: (NOTE) Fact Sheet for Patients: BloggerCourse.com  Fact Sheet for Healthcare Providers: SeriousBroker.it  This test is not yet approved or cleared by the [place] FDA and has been authorized for detection and/or diagnosis of SARS-CoV-2 by FDA under an Emergency Use Authorization (EUA). This EUA will remain in effect (meaning this test can be used) for the duration of the COVID-19 declaration under Section 564(b)(1) of the Act, 21 U.S.C. section 360bbb-3(b)(1), unless the authorization is terminated or revoked.  Performed at [facility], [address], [place], [place] [address]     Labs: CBC: Recent Labs  Lab 12/18/22 1134 12/18/22 2047 12/19/22 0227  WBC 9.0 9.4 7.4  NEUTROABS  --   --  5.5  HGB 11.1* 11.2* 10.8*  HCT 33.3* 34.3* 33.2*  MCV 90.5 90.0 90.0  PLT 287 305 292   Basic Metabolic Panel: Recent Labs  Lab 12/18/22 1204 12/18/22 2047 12/19/22 0227 12/20/22 0307 12/21/22 0409 12/22/22 0306  NA 137  --  139 138 134* 132*  K 4.0  --  3.7 3.6 4.1 4.2  CL 102  --  101 98 97* 97*  CO2 26  --  28 27 26 25   GLUCOSE 113*  --  102* 88 115* 121*  BUN 25*  --  17 13 12 14   CREATININE 0.96 0.87 0.90 1.09* 0.95 0.97  CALCIUM 9.3  --  8.7* 9.1 9.0 8.7*  MG  --   --   --  1.9 2.2  --   PHOS  --   --   --   --   --  3.0   Liver Function  Tests: Recent Labs  Lab 12/18/22 1204 12/22/22 0306  AST 23  --   ALT 29  --   ALKPHOS 88  --   BILITOT 1.1  --   PROT 7.2  --   ALBUMIN 4.0 2.9*   CBG: Recent Labs  Lab 12/20/22 0619  GLUCAP 92    Discharge time spent: greater than 30 minutes.  Signed: Coralie Keens, MD [facility] 12/22/2022

## 2022-12-22 NOTE — Plan of Care (Signed)
  Problem: Nutrition: Goal: Adequate nutrition will be maintained Outcome: Progressing   Problem: Elimination: Goal: Will not experience complications related to bowel motility Outcome: Progressing   Problem: Pain Management: Goal: General experience of comfort will improve Outcome: Progressing   Problem: Safety: Goal: Ability to remain free from injury will improve Outcome: Progressing

## 2022-12-22 NOTE — Plan of Care (Signed)

## 2022-12-22 NOTE — TOC Progression Note (Addendum)
Transition of Care [facility]) - Progression Note    Patient Details  Name: Kristen Pham MRN: [id number] Date of Birth: 1924/07/13  Transition of [facility]) CM/SW Contact  Michaela Corner, [place] Phone Number: 12/22/2022, 10:42 AM  Clinical Narrative:   Family chose [facility] at this time. CSW started [place] and updated [employer] about placement choice.   12:54PM: Ins auth approved, auth id - [id number];  Approval dates are 12/22/2022-12/24/2022  2:27PM: Per [facility], they can accept pt tomorrow morning.     Expected Discharge Plan: Skilled Nursing Facility Barriers to Discharge: Insurance Authorization, SNF Pending bed offer  Expected Discharge Plan and Services       Living arrangements for the past 2 months: Independent Living Facility                                       Social Determinants of Health (SDOH) Interventions SDOH Screenings   Food Insecurity: No Food Insecurity (12/18/2022)  Housing: Low Risk  (12/18/2022)  Transportation Needs: No Transportation Needs (12/18/2022)  Utilities: Not At Risk (12/18/2022)  Depression (PHQ2-9): Low Risk  (12/14/2022)  Recent Concern: Depression (PHQ2-9) - High Risk (10/15/2022)  Social Connections: Unknown (09/30/2021)   Received from [facility], [facility]  Tobacco Use: Low Risk  (12/18/2022)    Readmission Risk Interventions     No data to display

## 2022-12-22 NOTE — Progress Notes (Signed)
Mobility Specialist Progress Note:    12/22/22 1539  Mobility  Activity Transferred from chair to bed  Level of Assistance Moderate assist, patient does 50-74%  Assistive Device Four wheel walker  Distance Ambulated (ft) 5 ft  Activity Response Tolerated well  Mobility Referral Yes  $Mobility charge 1 Mobility  Mobility Specialist Start Time (ACUTE ONLY) 1440  Mobility Specialist Stop Time (ACUTE ONLY) 1452  Mobility Specialist Time Calculation (min) (ACUTE ONLY) 12 min   Pt received in chair requesting help getting back in bed. Pt needed ModA throughout session d/t strong posterior lean. Was able to take a couple steeps towards the bed w/o fault. Call bell and personal belongings in reach. All needs met.  Thompson Grayer Mobility Specialist  Please contact vis Secure Chat or  Rehab Office [phone]

## 2022-12-23 DIAGNOSIS — I5033 Acute on chronic diastolic (congestive) heart failure: Secondary | ICD-10-CM | POA: Diagnosis not present

## 2022-12-23 LAB — BASIC METABOLIC PANEL
Anion gap: 10 (ref 5–15)
BUN: 22 mg/dL (ref 8–23)
CO2: 26 mmol/L (ref 22–32)
Calcium: 8.8 mg/dL — ABNORMAL LOW (ref 8.9–10.3)
Chloride: 96 mmol/L — ABNORMAL LOW (ref 98–111)
Creatinine, Ser: 1.01 mg/dL — ABNORMAL HIGH (ref 0.44–1.00)
GFR, Estimated: 51 mL/min — ABNORMAL LOW (ref >60)
Glucose, Bld: 159 mg/dL — ABNORMAL HIGH (ref 70–99)
Potassium: 4 mmol/L (ref 3.5–5.1)
Sodium: 132 mmol/L — ABNORMAL LOW (ref 135–145)

## 2022-12-23 LAB — CBC
HCT: 36.2 % (ref 36.0–46.0)
Hemoglobin: 12.2 g/dL (ref 12.0–15.0)
MCH: 30 pg (ref 26.0–34.0)
MCHC: 33.7 g/dL (ref 30.0–36.0)
MCV: 88.9 fL (ref 80.0–100.0)
Platelets: 276 10*3/uL (ref 150–400)
RBC: 4.07 MIL/uL (ref 3.87–5.11)
RDW: 13.2 % (ref 11.5–15.5)
WBC: 13.5 10*3/uL — ABNORMAL HIGH (ref 4.0–10.5)
nRBC: 0 % (ref 0.0–0.2)

## 2022-12-23 LAB — AMMONIA: Ammonia: 16 umol/L (ref 9–35)

## 2022-12-23 LAB — HEPATIC FUNCTION PANEL
ALT: 19 U/L (ref 0–44)
AST: 21 U/L (ref 15–41)
Albumin: 2.8 g/dL — ABNORMAL LOW (ref 3.5–5.0)
Alkaline Phosphatase: 67 U/L (ref 38–126)
Bilirubin, Direct: 0.4 mg/dL — ABNORMAL HIGH (ref 0.0–0.2)
Indirect Bilirubin: 0.9 mg/dL (ref 0.3–0.9)
Total Bilirubin: 1.3 mg/dL — ABNORMAL HIGH (ref <1.2)
Total Protein: 7 g/dL (ref 6.5–8.1)

## 2022-12-23 LAB — URINALYSIS, ROUTINE W REFLEX MICROSCOPIC
Bacteria, UA: NONE SEEN
Bilirubin Urine: NEGATIVE
Glucose, UA: NEGATIVE mg/dL
Hgb urine dipstick: NEGATIVE
Ketones, ur: NEGATIVE mg/dL
Leukocytes,Ua: NEGATIVE
Nitrite: NEGATIVE
Protein, ur: 100 mg/dL — AB
Specific Gravity, Urine: 1.023 (ref 1.005–1.030)
pH: 5 (ref 5.0–8.0)

## 2022-12-23 LAB — URIC ACID: Uric Acid, Serum: 6.4 mg/dL (ref 2.5–7.1)

## 2022-12-23 LAB — SEDIMENTATION RATE: Sed Rate: 83 mm/h — ABNORMAL HIGH (ref 0–22)

## 2022-12-23 NOTE — Progress Notes (Signed)
Physical Therapy Treatment Patient Details Name: Kristen Pham MRN: [id number] DOB: [birth year] Today's Date: 12/23/2022   History of Present Illness 89 y.o. female presents to [facility] 12/18/22 w/ SOB, weight gain, and BLE edema. Chest x-ray showed small pleural effusions and possible atelectasis vs. PNA. PMHx: HTN, HFpEF, PAD/atherosclerosis, anxiety/depression, chronic toe pain    PT Comments  Focused session on reducing pt's posterior lean in standing, gait training, and transfer training. Her posterior lean quickly improved with cuing. She did need tactile and verbal cues for weight shifting and longer steps when ambulating though. She is at high risk for falls, demonstrating deficits in leg strength, balance, and endurance. Pt instructed on performing serial sit <> stand reps for improved strength, endurance, and independence with transfers. Will continue to follow acutely.    If plan is discharge home, recommend the following: A little help with walking and/or transfers;A little help with bathing/dressing/bathroom;Help with stairs or ramp for entrance;Assist for transportation;Assistance with cooking/housework   Can travel by private vehicle     Yes  Equipment Recommendations  None recommended by PT    Recommendations for Other Services       Precautions / Restrictions Precautions Precautions: Fall Precaution Comments: watch HR and O2 Restrictions Weight Bearing Restrictions: No     Mobility  Bed Mobility               General bed mobility comments: Up in recliner    Transfers Overall transfer level: Needs assistance Equipment used: Rollator (4 wheels) Transfers: Sit to/from Stand Sit to Stand: Min assist           General transfer comment: Pt tends to pull up on the rollator to stand, needing cues to reach back for chair prior to sitting. MinA to power up to stand and gain balance, x6 reps    Ambulation/Gait Ambulation/Gait assistance: Min assist Gait  Distance (Feet): 80 Feet Assistive device: Rollator (4 wheels) Gait Pattern/deviations: Step-through pattern, Shuffle, Step-to pattern, Decreased stance time - left, Decreased step length - right, Decreased step length - left, Trunk flexed, Narrow base of support, Leaning posteriorly, Decreased weight shift to left, Decreased weight shift to right Gait velocity: decr Gait velocity interpretation: <1.31 ft/sec, indicative of household ambulator   General Gait Details: Pt wore her shoes when ambulating. Pt initially leaning posteriorly and needed cues to place weight more through her toes and push her rollator forward a little to correct her lean, success noted. Pt takes slow, small, shuffling steps, alternating between step-to and step-through pattern. Pt needs tactile cues for weight shifting and verbal cues to take longer steps bil.MinA for balance   Stairs             Wheelchair Mobility     Tilt Bed    Modified Rankin (Stroke Patients Only)       Balance Overall balance assessment: Needs assistance Sitting-balance support: No upper extremity supported, Feet supported Sitting balance-Leahy Scale: Good   Postural control: Posterior lean Standing balance support: Bilateral upper extremity supported, During functional activity, Reliant on assistive device for balance Standing balance-Leahy Scale: Poor Standing balance comment: rollator and CGA-minA for static standing, posterior lean noted which improved with cuing to place weight through toes and push rollator a couple steps forward                            Cognition Arousal: Alert Behavior During Therapy: WFL for tasks assessed/performed Overall Cognitive Status: Within  Functional Limits for tasks assessed                                          Exercises Other Exercises Other Exercises: sit <> stand 5x with UE use, minA    General Comments        Pertinent Vitals/Pain Pain  Assessment Pain Assessment: Faces Faces Pain Scale: Hurts little more Pain Location: R foot/ankle, R UE Pain Descriptors / Indicators: Grimacing, Guarding, Discomfort Pain Intervention(s): Limited activity within patient's tolerance, Monitored during session, Repositioned    Home Living                          Prior Function            PT Goals (current goals can now be found in the care plan section) Acute Rehab PT Goals Patient Stated Goal: to get stronger PT Goal Formulation: With patient/family Time For Goal Achievement: 01/02/23 Potential to Achieve Goals: Good Progress towards PT goals: Progressing toward goals    Frequency    Min 1X/week      PT Plan      Co-evaluation              AM-PAC PT "6 Clicks" Mobility   Outcome Measure  Help needed turning from your back to your side while in a flat bed without using bedrails?: None Help needed moving from lying on your back to sitting on the side of a flat bed without using bedrails?: A Little Help needed moving to and from a bed to a chair (including a wheelchair)?: A Little Help needed standing up from a chair using your arms (e.g., wheelchair or bedside chair)?: A Little Help needed to walk in hospital room?: A Little Help needed climbing 3-5 steps with a railing? : A Lot 6 Click Score: 18    End of Session Equipment Utilized During Treatment: Gait belt Activity Tolerance: Patient tolerated treatment well Patient left: in chair;with call bell/phone within reach;with family/visitor present   PT Visit Diagnosis: Unsteadiness on feet (R26.81);Muscle weakness (generalized) (M62.81);Other abnormalities of gait and mobility (R26.89);Difficulty in walking, not elsewhere classified (R26.2)     Time: 1610-9604 PT Time Calculation (min) (ACUTE ONLY): 30 min  Charges:    $Gait Training: 8-22 mins $Therapeutic Activity: 8-22 mins PT General Charges $$ ACUTE PT VISIT: 1 Visit                      Virgil Benedict, PT, DPT Acute Rehabilitation Services  Office: [phone]    Bettina Gavia 12/23/2022, 4:15 PM

## 2022-12-23 NOTE — Plan of Care (Signed)
  Problem: Activity: Goal: Risk for activity intolerance will decrease Outcome: Progressing   Problem: Nutrition: Goal: Adequate nutrition will be maintained Outcome: Progressing   Problem: Elimination: Goal: Will not experience complications related to bowel motility Outcome: Progressing   

## 2022-12-23 NOTE — Progress Notes (Signed)
Pt refused to have skin assessment at this time.

## 2022-12-23 NOTE — Progress Notes (Signed)
Patient discharge via [facility] to [facility] in stable condition.

## 2022-12-23 NOTE — Progress Notes (Signed)
Mobility Specialist Progress Note:    12/23/22 1151  Mobility  Activity Transferred from bed to chair  Level of Assistance Moderate assist, patient does 50-74% (+2 safety)  Assistive Device Four wheel walker  Distance Ambulated (ft) 4 ft  Activity Response Tolerated well  Mobility Referral Yes  $Mobility charge 1 Mobility  Mobility Specialist Start Time (ACUTE ONLY) 1029  Mobility Specialist Stop Time (ACUTE ONLY) 1039  Mobility Specialist Time Calculation (min) (ACUTE ONLY) 10 min   Pt received in bed requesting to get in chair. Pt needed ModA w/ bed mobility and STS. Had a strong posterior lean throughout session +2 for safety. Needed min verbal cues. Set up in the chair w/ all needs met and chair alarm on. Family in room.   Thompson Grayer Mobility Specialist  Please contact vis Secure Chat or  Rehab Office [phone]

## 2022-12-23 NOTE — Progress Notes (Addendum)
PROGRESS NOTE    Kristen Pham  EXB:[id number] DOB: 1924-02-07 DOA: 12/18/2022 PCP: Creola Corn, MD  89/F with history of diastolic CHF, peripheral vascular disease, hypertension, disc depression presented to the ED with 4 days of dyspnea with lower extremity edema and weight gain. -In the ER she was hypoxic and tachypneic, chest x-ray noted cardiomegaly pulmonary vascular congestion and small pleural effusions -Improved with diuretics, also noted to be extremely weak and deconditioned, plan for SNF   Subjective: -Somnolent and tired this morning  Assessment and Plan:  Acute on chronic diastolic CHF (congestive heart failure) (HCC) -Echo noted EF of 55-60%, preserved RV, mild to moderate MR -diuresed with IV Lasix, 5.1 L negative  -Volume status has improved  -Continue metoprolol, Aldactone, irbesartan  -Use Lasix PRN, poor candidate for SGLT2i  Lethargy, mild encephalopathy -Has mild swelling and discomfort at the right elbow near IV site -Remove right AC IV -Check CBC, LFTs, ammonia level, uric acid, ESR  Essential hypertension -Stable, meds as above  PAD (peripheral artery disease) (HCC) Continue atorvastatin.   Iron deficiency anemia Iron panel with serum iron 29, TIBC 281, transferrin saturation 10 and ferritin 104,  -Given IV iron 12/1  Chronic toe pain, left foot Continue pain control with tramadol and topical diclofenac.   Anxiety with depression Continue with escitalopram.   DVT prophylaxis: Lovenox Code Status: Full code Family Communication: No family at bedside, aide present Disposition Plan: Home pending improvement in mental status  Consultants:    Procedures:   Antimicrobials:    Objective: Vitals:   12/23/22 0433 12/23/22 0500 12/23/22 0716 12/23/22 1142  BP: (!) 137/59  120/72 (!) 145/58  Pulse: 78  75 77  Resp: 18  18 16   Temp: (!) 97.5 F (36.4 C)  97.6 F (36.4 C) 97.7 F (36.5 C)  TempSrc: Oral  Oral Oral  SpO2: 96%  97% 96%   Weight:  45.8 kg    Height:        Intake/Output Summary (Last 24 hours) at 12/23/2022 1148 Last data filed at 12/23/2022 0900 Gross per 24 hour  Intake 3 ml  Output 200 ml  Net -197 ml   Filed Weights   12/22/22 0343 12/23/22 0038 12/23/22 0500  Weight: 46.6 kg 45.8 kg 45.8 kg    Examination:  General exam: Chronically ill elderly female laying in bed, AAO x 2, somnolent but easily arousable  Respiratory system: Poor air movement bilaterally otherwise clear Cardiovascular system: S1 & S2 heard, RRR.  Abd: nondistended, soft and nontender.Normal bowel sounds heard. Extremities: no edema Skin: No rashes Psychiatry:  Mood & affect appropriate.     Data Reviewed:   CBC: Recent Labs  Lab 12/18/22 1134 12/18/22 2047 12/19/22 0227 12/23/22 1020  WBC 9.0 9.4 7.4 13.5*  NEUTROABS  --   --  5.5  --   HGB 11.1* 11.2* 10.8* 12.2  HCT 33.3* 34.3* 33.2* 36.2  MCV 90.5 90.0 90.0 88.9  PLT 287 305 292 276   Basic Metabolic Panel: Recent Labs  Lab 12/18/22 1204 12/18/22 2047 12/19/22 0227 12/20/22 0307 12/21/22 0409 12/22/22 0306  NA 137  --  139 138 134* 132*  K 4.0  --  3.7 3.6 4.1 4.2  CL 102  --  101 98 97* 97*  CO2 26  --  28 27 26 25   GLUCOSE 113*  --  102* 88 115* 121*  BUN 25*  --  17 13 12 14   CREATININE 0.96 0.87 0.90 1.09* 0.95 0.97  CALCIUM 9.3  --  8.7* 9.1 9.0 8.7*  MG  --   --   --  1.9 2.2  --   PHOS  --   --   --   --   --  3.0   GFR: Estimated Creatinine Clearance: 24.4 mL/min (by C-G formula based on SCr of 0.97 mg/dL). Liver Function Tests: Recent Labs  Lab 12/18/22 1204 12/22/22 0306 12/23/22 1020  AST 23  --  21  ALT 29  --  19  ALKPHOS 88  --  67  BILITOT 1.1  --  1.3*  PROT 7.2  --  7.0  ALBUMIN 4.0 2.9* 2.8*   No results for input(s): "LIPASE", "AMYLASE" in the last 168 hours. Recent Labs  Lab 12/23/22 1020  AMMONIA 16   Coagulation Profile: No results for input(s): "INR", "PROTIME" in the last 168 hours. Cardiac  Enzymes: No results for input(s): "CKTOTAL", "CKMB", "CKMBINDEX", "TROPONINI" in the last 168 hours. BNP (last 3 results) No results for input(s): "PROBNP" in the last 8760 hours. HbA1C: No results for input(s): "HGBA1C" in the last 72 hours. CBG: Recent Labs  Lab 12/20/22 0619  GLUCAP 92   Lipid Profile: No results for input(s): "CHOL", "HDL", "LDLCALC", "TRIG", "CHOLHDL", "LDLDIRECT" in the last 72 hours. Thyroid Function Tests: No results for input(s): "TSH", "T4TOTAL", "FREET4", "T3FREE", "THYROIDAB" in the last 72 hours. Anemia Panel: No results for input(s): "VITAMINB12", "FOLATE", "FERRITIN", "TIBC", "IRON", "RETICCTPCT" in the last 72 hours. Urine analysis:    Component Value Date/Time   LABSPEC 1.015 07/24/2009 2030   PHURINE 7.0 07/24/2009 2030   GLUCOSEU NEGATIVE 07/24/2009 2030   HGBUR TRACE (A) 07/24/2009 2030   BILIRUBINUR NEGATIVE 07/24/2009 2030   KETONESUR NEGATIVE 07/24/2009 2030   PROTEINUR NEGATIVE 07/24/2009 2030   UROBILINOGEN 0.2 07/24/2009 2030   NITRITE NEGATIVE 07/24/2009 2030   LEUKOCYTESUR  07/24/2009 2030    NEGATIVE Biochemical Testing Only. Please order routine urinalysis from main lab if confirmatory testing is needed.   Sepsis Labs: @LABRCNTIP (procalcitonin:4,lacticidven:4)  ) Recent Results (from the past 240 hour(s))  Resp panel by RT-PCR (RSV, Flu A&B, Covid) Anterior Nasal Swab     Status: None   Collection Time: 12/18/22 12:18 PM   Specimen: Anterior Nasal Swab  Result Value Ref Range Status   SARS Coronavirus 2 by RT PCR NEGATIVE NEGATIVE Final    Comment: (NOTE) SARS-CoV-2 target nucleic acids are NOT DETECTED.  The SARS-CoV-2 RNA is generally detectable in upper respiratory specimens during the acute phase of infection. The lowest concentration of SARS-CoV-2 viral copies this assay can detect is 138 copies/mL. A negative result does not preclude SARS-Cov-2 infection and should not be used as the sole basis for treatment  or other patient management decisions. A negative result may occur with  improper specimen collection/handling, submission of specimen other than nasopharyngeal swab, presence of viral mutation(s) within the areas targeted by this assay, and inadequate number of viral copies(<138 copies/mL). A negative result must be combined with clinical observations, patient history, and epidemiological information. The expected result is Negative.  Fact Sheet for Patients:  BloggerCourse.com  Fact Sheet for Healthcare Providers:  SeriousBroker.it  This test is no t yet approved or cleared by the [place] FDA and  has been authorized for detection and/or diagnosis of SARS-CoV-2 by FDA under an Emergency Use Authorization (EUA). This EUA will remain  in effect (meaning this test can be used) for the duration of the COVID-19 declaration under Section 564(b)(1) of the  Act, 21 U.S.C.section 360bbb-3(b)(1), unless the authorization is terminated  or revoked sooner.       Influenza A by PCR NEGATIVE NEGATIVE Final   Influenza B by PCR NEGATIVE NEGATIVE Final    Comment: (NOTE) The Xpert Xpress SARS-CoV-2/FLU/RSV plus assay is intended as an aid in the diagnosis of influenza from Nasopharyngeal swab specimens and should not be used as a sole basis for treatment. Nasal washings and aspirates are unacceptable for Xpert Xpress SARS-CoV-2/FLU/RSV testing.  Fact Sheet for Patients: BloggerCourse.com  Fact Sheet for Healthcare Providers: SeriousBroker.it  This test is not yet approved or cleared by the [place] FDA and has been authorized for detection and/or diagnosis of SARS-CoV-2 by FDA under an Emergency Use Authorization (EUA). This EUA will remain in effect (meaning this test can be used) for the duration of the COVID-19 declaration under Section 564(b)(1) of the Act, 21 U.S.C. section  360bbb-3(b)(1), unless the authorization is terminated or revoked.     Resp Syncytial Virus by PCR NEGATIVE NEGATIVE Final    Comment: (NOTE) Fact Sheet for Patients: BloggerCourse.com  Fact Sheet for Healthcare Providers: SeriousBroker.it  This test is not yet approved or cleared by the [place] FDA and has been authorized for detection and/or diagnosis of SARS-CoV-2 by FDA under an Emergency Use Authorization (EUA). This EUA will remain in effect (meaning this test can be used) for the duration of the COVID-19 declaration under Section 564(b)(1) of the Act, 21 U.S.C. section 360bbb-3(b)(1), unless the authorization is terminated or revoked.  Performed at [facility], [address] [place], [place] [address]      Radiology Studies: No results found.   Scheduled Meds:  atorvastatin  20 mg Oral Daily   diclofenac Sodium  2 g Topical QID   enoxaparin (LOVENOX) injection  30 mg Subcutaneous Q24H   escitalopram  5 mg Oral Daily   gabapentin  100 mg Oral TID   irbesartan  150 mg Oral Daily   metoprolol succinate  25 mg Oral Daily   sodium chloride flush  3 mL Intravenous Q12H   spironolactone  12.5 mg Oral Daily   Continuous Infusions:   LOS: 5 days    Time spent:    Zannie Cove, MD [facility]   12/23/2022, 11:48 AM

## 2022-12-23 NOTE — Progress Notes (Signed)
Handoff report given to [facility] at [place] facility.

## 2022-12-23 NOTE — Discharge Summary (Addendum)
Physician Discharge Summary  Kristen Pham:[id number] DOB: 04/14/1924 DOA: 12/18/2022  PCP: Creola Corn, MD  Admit date: 12/18/2022 Discharge date: 12/23/2022  Time spent: 45 minutes  Recommendations for Outpatient Follow-up:  PCP Dr. Timothy Lasso within 1 week, please repeat ESR in 2 weeks Recommend goals of care and CODE STATUS discussions   Discharge Diagnoses:  Principal Problem:   Acute on chronic diastolic CHF (congestive heart failure) (HCC) Mild lethargy   Essential hypertension   PAD (peripheral artery disease) (HCC)   Anemia   Chronic toe pain, left foot   Anxiety with depression   Discharge Condition: Improved  Diet recommendation: Low-sodium, heart healthy  Filed Weights   12/22/22 0343 12/23/22 0038 12/23/22 0500  Weight: 46.6 kg 45.8 kg 45.8 kg    History of present illness:  89/F with history of diastolic CHF, peripheral vascular disease, hypertension, disc depression presented to the ED with 4 days of dyspnea with lower extremity edema and weight gain. -In the ER she was hypoxic and tachypneic, chest x-ray noted cardiomegaly pulmonary vascular congestion and small pleural effusions -Improved with diuretics, also noted to be extremely weak and deconditioned, plan for [facility]    Hospital Course:   Acute on chronic diastolic CHF (congestive heart failure) (HCC) -Echo noted EF of 55-60%, preserved RV, mild to moderate MR  -diuresed with IV Lasix, 5.1 L negative  -Volume status has improved  -Continue metoprolol, Aldactone, irbesartan  -Use Lasix PRN, poor candidate for SGLT2i   Lethargy, mild encephalopathy -Has mild swelling and discomfort at the right elbow near IV site -Removed right AC IV, CBC with mild leukocytosis and elevated ESR -Rest of the workup including LFTs, ammonia level, uric acid and urinalysis are unremarkable -Mental status has improved, per daughter she is better and almost at baseline now, right elbow pain is improving after IV removal   -patient and family are adamant about discharge today, I recommend close follow-up with Dr. Timothy Lasso in 2 to 3 days and repeat ESR in 2 weeks   Essential hypertension -Stable, meds as above   PAD (peripheral artery disease) (HCC) Continue atorvastatin.    Iron deficiency anemia Iron panel with serum iron 29, TIBC 281, transferrin saturation 10 and ferritin 104,  -Given IV iron 12/1   Chronic toe pain, left foot Continue pain control with tramadol and topical diclofenac.    Anxiety with depression Continue with escitalopram.  Discharge Exam: Vitals:   12/23/22 0716 12/23/22 1142  BP: 120/72 (!) 145/58  Pulse: 75 77  Resp: 18 16  Temp: 97.6 F (36.4 C) 97.7 F (36.5 C)  SpO2: 97% 96%   Gen: Frail chronically ill elderly female sitting up in the recliner, AAO x 2 HEENT: no JVD Lungs: Good air movement bilaterally, CTAB CVS: S1S2/RRR Abd: soft, Non tender, non distended, BS present Extremities: No edema, mildly limited range of motion right elbow Skin: no new rashes on exposed skin   Discharge Instructions   Discharge Instructions     Diet - low sodium heart healthy   Complete by: As directed    Discharge instructions   Complete by: As directed    Please follow up with primary care in 7 to 10 days.   Increase activity slowly   Complete by: As directed       Allergies as of 12/23/2022       Reactions   Ivp Dye [iodinated Contrast Media]    Felt confused, disoriented        Medication List  STOP taking these medications    amLODipine 10 MG tablet Commonly known as: NORVASC   atenolol 50 MG tablet Commonly known as: TENORMIN   ibuprofen 200 MG tablet Commonly known as: ADVIL       TAKE these medications    acetaminophen 500 MG tablet Commonly known as: TYLENOL Take 500 mg by mouth at bedtime.   atorvastatin 20 MG tablet Commonly known as: LIPITOR Take 1 tablet (20 mg total) by mouth daily.   diclofenac Sodium 1 % Gel Commonly known  as: VOLTAREN Apply 2 g topically 4 (four) times daily. Apply to right shoulder.   escitalopram 5 MG tablet Commonly known as: LEXAPRO Take 5 mg by mouth daily.   furosemide 20 MG tablet Commonly known as: LASIX Take 1 tablet (20 mg total) by mouth daily as needed for fluid or edema.   gabapentin 100 MG capsule Commonly known as: NEURONTIN Take 1 capsule (100 mg total) by mouth 2 (two) times daily.   irbesartan 150 MG tablet Commonly known as: AVAPRO Take 1 tablet (150 mg total) by mouth daily.   MAGNESIUM PO Take 1 tablet by mouth daily as needed.   metoprolol succinate 25 MG 24 hr tablet Commonly known as: TOPROL-XL Take 1 tablet (25 mg total) by mouth daily.   NONFORMULARY OR COMPOUNDED ITEM Take 1 drop by mouth daily. CBD Drops.   spironolactone 25 MG tablet Commonly known as: ALDACTONE Take 0.5 tablets (12.5 mg total) by mouth daily.   traMADol 50 MG tablet Commonly known as: ULTRAM Take 1 tablet (50 mg total) by mouth every 6 (six) hours as needed.       Allergies  Allergen Reactions   Ivp Dye [Iodinated Contrast Media]     Felt confused, disoriented    Contact information for after-discharge care     Destination     [facility] Preferred SNF .   Service: Skilled Nursing Contact information: [address] [place] [address] [phone]                      The results of significant diagnostics from this hospitalization (including imaging, microbiology, ancillary and laboratory) are listed below for reference.    Significant Diagnostic Studies: ECHOCARDIOGRAM COMPLETE  Result Date: 12/19/2022    ECHOCARDIOGRAM REPORT   Patient Name:   Kristen Pham Date of Exam: 12/19/2022 Medical Rec #:  [id number]         Height:       60.0 in Accession #:    [id number]        Weight:       110.2 lb Date of Birth:  [birth year]          BSA:          1.449 m Patient Age:    89 years          BP:            154/74 mmHg Patient Gender: F                 HR:           76 bpm. Exam Location:  Inpatient Procedure: 2D Echo, 3D Echo, Strain Analysis, Color Doppler and Cardiac Doppler Indications:    CHF-Acute Diastolic  History:        Patient has prior history of Echocardiogram examinations, most                 recent 04/11/2020.  PAD, Signs/Symptoms:Shortness of Breath; Risk                 Factors:Hypertension. Pleural Effusion.  Sonographer:    Raeford Razor RDCS Referring Phys: [id number] Jilda Roche [facility] IMPRESSIONS  1. Left ventricular ejection fraction, by estimation, is 55 to 60%. The left ventricle has normal function. The left ventricle has no regional wall motion abnormalities. Left ventricular diastolic parameters are consistent with Grade II diastolic dysfunction (pseudonormalization). The average left ventricular global longitudinal strain is -19.0 %. The global longitudinal strain is normal.  2. Right ventricular systolic function is normal. The right ventricular size is normal. Tricuspid regurgitation signal is inadequate for assessing PA pressure.  3. Left atrial size was moderately dilated.  4. Right atrial size was mildly dilated.  5. The mitral valve is degenerative. Mild to moderate mitral valve regurgitation. No evidence of mitral stenosis.  6. The aortic valve is calcified. Aortic valve regurgitation is mild. Mild aortic valve stenosis. Aortic valve area, by VTI measures 1.10 cm. Aortic valve mean gradient measures 14.0 mmHg. Aortic valve Vmax measures 2.47 m/s.  7. The inferior vena cava is normal in size with greater than 50% respiratory variability, suggesting right atrial pressure of 3 mmHg. Comparison(s): No significant change from prior study. FINDINGS  Left Ventricle: Left ventricular ejection fraction, by estimation, is 55 to 60%. The left ventricle has normal function. The left ventricle has no regional wall motion abnormalities. The average left ventricular global longitudinal strain is -19.0  %. The global longitudinal strain is normal. The left ventricular internal cavity size was normal in size. There is no left ventricular hypertrophy. Left ventricular diastolic parameters are consistent with Grade II diastolic dysfunction (pseudonormalization). Right Ventricle: The right ventricular size is normal. No increase in right ventricular wall thickness. Right ventricular systolic function is normal. Tricuspid regurgitation signal is inadequate for assessing PA pressure. Left Atrium: Left atrial size was moderately dilated. Right Atrium: Right atrial size was mildly dilated. Pericardium: There is no evidence of pericardial effusion. Mitral Valve: The mitral valve is degenerative in appearance. Mild to moderate mitral annular calcification. Mild to moderate mitral valve regurgitation. No evidence of mitral valve stenosis. Tricuspid Valve: The tricuspid valve is grossly normal. Tricuspid valve regurgitation is mild . No evidence of tricuspid stenosis. Aortic Valve: The aortic valve is calcified. Aortic valve regurgitation is mild. Mild aortic stenosis is present. Aortic valve mean gradient measures 14.0 mmHg. Aortic valve peak gradient measures 24.4 mmHg. Aortic valve area, by VTI measures 1.10 cm. Pulmonic Valve: The pulmonic valve was grossly normal. Pulmonic valve regurgitation is not visualized. No evidence of pulmonic stenosis. Aorta: The aortic root and ascending aorta are structurally normal, with no evidence of dilitation. Venous: The inferior vena cava is normal in size with greater than 50% respiratory variability, suggesting right atrial pressure of 3 mmHg. IAS/Shunts: The atrial septum is grossly normal. Additional Comments: There is a small pleural effusion in the left lateral region.  LEFT VENTRICLE PLAX 2D LVIDd:         4.30 cm   Diastology LVIDs:         3.10 cm   LV e' medial:    7.72 cm/s LV PW:         0.90 cm   LV E/e' medial:  20.7 LV IVS:        0.90 cm   LV e' lateral:   7.07 cm/s LVOT  diam:     1.80 cm   LV E/e' lateral:  22.6 LV SV:         67 LV SV Index:   46        2D Longitudinal Strain LVOT Area:     2.54 cm  2D Strain GLS Avg:     -19.0 %                           3D Volume EF:                          3D EF:        52 %                          LV EDV:       108 ml                          LV ESV:       52 ml                          LV SV:        57 ml RIGHT VENTRICLE             IVC RV Basal diam:  2.90 cm     IVC diam: 1.60 cm RV S prime:     13.50 cm/s TAPSE (M-mode): 2.1 cm LEFT ATRIUM             Index        RIGHT ATRIUM           Index LA diam:        3.50 cm 2.42 cm/m   RA Area:     19.50 cm LA Vol (A2C):   52.8 ml 36.44 ml/m  RA Volume:   61.80 ml  42.65 ml/m LA Vol (A4C):   66.1 ml 45.62 ml/m LA Biplane Vol: 59.9 ml 41.34 ml/m  AORTIC VALVE AV Area (Vmax):    0.97 cm AV Area (Vmean):   0.96 cm AV Area (VTI):     1.10 cm AV Vmax:           247.00 cm/s AV Vmean:          175.000 cm/s AV VTI:            0.609 m AV Peak Grad:      24.4 mmHg AV Mean Grad:      14.0 mmHg LVOT Vmax:         94.40 cm/s LVOT Vmean:        66.300 cm/s LVOT VTI:          0.264 m LVOT/AV VTI ratio: 0.43  AORTA Ao Root diam: 2.70 cm Ao Asc diam:  3.40 cm MITRAL VALVE MV Area (PHT): 6.12 cm     SHUNTS MV Decel Time: 124 msec     Systemic VTI:  0.26 m MV E velocity: 160.00 cm/s  Systemic Diam: 1.80 cm MV A velocity: 122.00 cm/s MV E/A ratio:  1.31 Lennie Odor MD Electronically signed by Lennie Odor MD Signature Date/Time: 12/19/2022/12:34:49 PM    Final    DG Chest Port 1 View  Result Date: 12/18/2022 CLINICAL DATA:  Shortness of breath EXAM: PORTABLE CHEST 1 VIEW COMPARISON:  04/04/2016 FINDINGS: Heart size is mildly enlarged. Aortic atherosclerosis. Small bilateral pleural effusions with associated bibasilar opacities.  No pneumothorax. IMPRESSION: Small bilateral pleural effusions with associated bibasilar opacities, which may represent atelectasis or pneumonia. Electronically Signed    By: Duanne Guess D.O.   On: 12/18/2022 12:23    Microbiology: Recent Results (from the past 240 hour(s))  Resp panel by RT-PCR (RSV, Flu A&B, Covid) Anterior Nasal Swab     Status: None   Collection Time: 12/18/22 12:18 PM   Specimen: Anterior Nasal Swab  Result Value Ref Range Status   SARS Coronavirus 2 by RT PCR NEGATIVE NEGATIVE Final    Comment: (NOTE) SARS-CoV-2 target nucleic acids are NOT DETECTED.  The SARS-CoV-2 RNA is generally detectable in upper respiratory specimens during the acute phase of infection. The lowest concentration of SARS-CoV-2 viral copies this assay can detect is 138 copies/mL. A negative result does not preclude SARS-Cov-2 infection and should not be used as the sole basis for treatment or other patient management decisions. A negative result may occur with  improper specimen collection/handling, submission of specimen other than nasopharyngeal swab, presence of viral mutation(s) within the areas targeted by this assay, and inadequate number of viral copies(<138 copies/mL). A negative result must be combined with clinical observations, patient history, and epidemiological information. The expected result is Negative.  Fact Sheet for Patients:  BloggerCourse.com  Fact Sheet for Healthcare Providers:  SeriousBroker.it  This test is no t yet approved or cleared by the [place] FDA and  has been authorized for detection and/or diagnosis of SARS-CoV-2 by FDA under an Emergency Use Authorization (EUA). This EUA will remain  in effect (meaning this test can be used) for the duration of the COVID-19 declaration under Section 564(b)(1) of the Act, 21 U.S.C.section 360bbb-3(b)(1), unless the authorization is terminated  or revoked sooner.       Influenza A by PCR NEGATIVE NEGATIVE Final   Influenza B by PCR NEGATIVE NEGATIVE Final    Comment: (NOTE) The Xpert Xpress SARS-CoV-2/FLU/RSV plus assay  is intended as an aid in the diagnosis of influenza from Nasopharyngeal swab specimens and should not be used as a sole basis for treatment. Nasal washings and aspirates are unacceptable for Xpert Xpress SARS-CoV-2/FLU/RSV testing.  Fact Sheet for Patients: BloggerCourse.com  Fact Sheet for Healthcare Providers: SeriousBroker.it  This test is not yet approved or cleared by the [place] FDA and has been authorized for detection and/or diagnosis of SARS-CoV-2 by FDA under an Emergency Use Authorization (EUA). This EUA will remain in effect (meaning this test can be used) for the duration of the COVID-19 declaration under Section 564(b)(1) of the Act, 21 U.S.C. section 360bbb-3(b)(1), unless the authorization is terminated or revoked.     Resp Syncytial Virus by PCR NEGATIVE NEGATIVE Final    Comment: (NOTE) Fact Sheet for Patients: BloggerCourse.com  Fact Sheet for Healthcare Providers: SeriousBroker.it  This test is not yet approved or cleared by the [place] FDA and has been authorized for detection and/or diagnosis of SARS-CoV-2 by FDA under an Emergency Use Authorization (EUA). This EUA will remain in effect (meaning this test can be used) for the duration of the COVID-19 declaration under Section 564(b)(1) of the Act, 21 U.S.C. section 360bbb-3(b)(1), unless the authorization is terminated or revoked.  Performed at [facility], [address] [place], [place] [address]      Labs: Basic Metabolic Panel: Recent Labs  Lab 12/19/22 0227 12/20/22 0307 12/21/22 0409 12/22/22 0306 12/23/22 1019  NA 139 138 134* 132* 132*  K 3.7 3.6 4.1 4.2 4.0  CL 101  98 97* 97* 96*  CO2 28 27 26 25 26   GLUCOSE 102* 88 115* 121* 159*  BUN 17 13 12 14 22   CREATININE 0.90 1.09* 0.95 0.97 1.01*  CALCIUM 8.7* 9.1 9.0 8.7* 8.8*  MG  --  1.9 2.2  --   --    PHOS  --   --   --  3.0  --    Liver Function Tests: Recent Labs  Lab 12/18/22 1204 12/22/22 0306 12/23/22 1020  AST 23  --  21  ALT 29  --  19  ALKPHOS 88  --  67  BILITOT 1.1  --  1.3*  PROT 7.2  --  7.0  ALBUMIN 4.0 2.9* 2.8*   No results for input(s): "LIPASE", "AMYLASE" in the last 168 hours. Recent Labs  Lab 12/23/22 1020  AMMONIA 16   CBC: Recent Labs  Lab 12/18/22 1134 12/18/22 2047 12/19/22 0227 12/23/22 1020  WBC 9.0 9.4 7.4 13.5*  NEUTROABS  --   --  5.5  --   HGB 11.1* 11.2* 10.8* 12.2  HCT 33.3* 34.3* 33.2* 36.2  MCV 90.5 90.0 90.0 88.9  PLT 287 305 292 276   Cardiac Enzymes: No results for input(s): "CKTOTAL", "CKMB", "CKMBINDEX", "TROPONINI" in the last 168 hours. BNP: BNP (last 3 results) Recent Labs    12/18/22 1204  BNP 739.3*    ProBNP (last 3 results) No results for input(s): "PROBNP" in the last 8760 hours.  CBG: Recent Labs  Lab 12/20/22 0619  GLUCAP 92       Signed:  Zannie Cove MD.  [facility] 12/23/2022, 2:45 PM

## 2022-12-23 NOTE — TOC Transition Note (Signed)
Transition of Care [facility]) - CM/SW Discharge Note   Patient Details  Name: Kristen Pham MRN: [id number] Date of Birth: [birth year]  Transition of Care [facility] CM/SW Contact:  Michaela Corner, LCSWA Phone Number: 12/23/2022, 3:01 PM   Clinical Narrative:   Patient will DC to: [facility] Anticipated DC date: 12/23/2022 Family notified: Elnita Maxwell - dtr Transport GE:[id number]   Per MD patient ready for DC to [facility]. RN to call report prior to discharge [phone]; room 203). RN, patient, patient's family, and facility notified of DC. Discharge Summary and FL2 sent to facility. DC packet on chart. Ambulance transport requested for patient.   CSW will sign off for now as social work intervention is no longer needed. Please consult [place] again if new needs arise.      Final next level of care: Skilled Nursing Facility Barriers to Discharge: Barriers Resolved   Patient Goals and CMS Choice CMS Medicare.gov Compare Post Acute Care list provided to:: Patient Represenative (must comment) Elnita Maxwell daughter POA) Choice offered to / list presented to : [facility] POA / Guardian  Discharge Placement                  Patient to be transferred to facility by: [facility]tar Name of family member notified: Elnita Maxwell - dtr Patient and family notified of of transfer: 12/23/22  Discharge Plan and Services Additional resources added to the After Visit Summary for                                       Social Determinants of Health (SDOH) Interventions SDOH Screenings   Food Insecurity: No Food Insecurity (12/18/2022)  Housing: Low Risk  (12/18/2022)  Transportation Needs: No Transportation Needs (12/18/2022)  Utilities: Not At Risk (12/18/2022)  Depression (PHQ2-9): Low Risk  (12/14/2022)  Recent Concern: Depression (PHQ2-9) - High Risk (10/15/2022)  Social Connections: Unknown (09/30/2021)   [facility] from [facility]  Tobacco Use: Low Risk   (12/18/2022)     Readmission Risk Interventions     No data to display

## 2022-12-23 NOTE — Plan of Care (Signed)

## 2022-12-28 ENCOUNTER — Encounter: Payer: Self-pay | Admitting: Physical Medicine & Rehabilitation

## 2023-01-02 ENCOUNTER — Encounter (HOSPITAL_COMMUNITY): Payer: Self-pay | Admitting: *Deleted

## 2023-01-02 ENCOUNTER — Emergency Department (HOSPITAL_COMMUNITY)
Admission: EM | Admit: 2023-01-02 | Discharge: 2023-01-02 | Disposition: A | Discharge status: Home / Self Care | Payer: Medicare Other | Attending: Emergency Medicine | Admitting: Emergency Medicine

## 2023-01-02 ENCOUNTER — Emergency Department (HOSPITAL_COMMUNITY): Payer: Medicare Other

## 2023-01-02 ENCOUNTER — Other Ambulatory Visit: Payer: Self-pay

## 2023-01-02 DIAGNOSIS — Z79899 Other long term (current) drug therapy: Secondary | ICD-10-CM | POA: Diagnosis not present

## 2023-01-02 DIAGNOSIS — R0602 Shortness of breath: Secondary | ICD-10-CM | POA: Diagnosis present

## 2023-01-02 DIAGNOSIS — I509 Heart failure, unspecified: Secondary | ICD-10-CM | POA: Diagnosis not present

## 2023-01-02 HISTORY — DX: Depression, unspecified: F32.A

## 2023-01-02 HISTORY — DX: Peripheral vascular disease, unspecified: I73.9

## 2023-01-02 HISTORY — DX: Anemia, unspecified: D64.9

## 2023-01-02 HISTORY — DX: Unspecified osteoarthritis, unspecified site: M19.90

## 2023-01-02 HISTORY — DX: Heart failure, unspecified: I50.9

## 2023-01-02 LAB — COMPREHENSIVE METABOLIC PANEL
ALT: 32 U/L (ref 0–44)
AST: 22 U/L (ref 15–41)
Albumin: 3 g/dL — ABNORMAL LOW (ref 3.5–5.0)
Alkaline Phosphatase: 81 U/L (ref 38–126)
Anion gap: 10 (ref 5–15)
BUN: 20 mg/dL (ref 8–23)
CO2: 25 mmol/L (ref 22–32)
Calcium: 9.2 mg/dL (ref 8.9–10.3)
Chloride: 103 mmol/L (ref 98–111)
Creatinine, Ser: 0.9 mg/dL (ref 0.44–1.00)
GFR, Estimated: 59 mL/min — ABNORMAL LOW (ref >60)
Glucose, Bld: 103 mg/dL — ABNORMAL HIGH (ref 70–99)
Potassium: 4.2 mmol/L (ref 3.5–5.1)
Sodium: 138 mmol/L (ref 135–145)
Total Bilirubin: 0.8 mg/dL (ref <1.2)
Total Protein: 7.3 g/dL (ref 6.5–8.1)

## 2023-01-02 LAB — CBC WITH DIFFERENTIAL/PLATELET
Abs Immature Granulocytes: 0.04 10*3/uL (ref 0.00–0.07)
Basophils Absolute: 0.1 10*3/uL (ref 0.0–0.1)
Basophils Relative: 1 %
Eosinophils Absolute: 0.3 10*3/uL (ref 0.0–0.5)
Eosinophils Relative: 4 %
HCT: 38.9 % (ref 36.0–46.0)
Hemoglobin: 12.4 g/dL (ref 12.0–15.0)
Immature Granulocytes: 1 %
Lymphocytes Relative: 17 %
Lymphs Abs: 1.5 10*3/uL (ref 0.7–4.0)
MCH: 29.5 pg (ref 26.0–34.0)
MCHC: 31.9 g/dL (ref 30.0–36.0)
MCV: 92.4 fL (ref 80.0–100.0)
Monocytes Absolute: 0.5 10*3/uL (ref 0.1–1.0)
Monocytes Relative: 5 %
Neutro Abs: 6.4 10*3/uL (ref 1.7–7.7)
Neutrophils Relative %: 72 %
Platelets: 483 10*3/uL — ABNORMAL HIGH (ref 150–400)
RBC: 4.21 MIL/uL (ref 3.87–5.11)
RDW: 13.4 % (ref 11.5–15.5)
WBC: 8.8 10*3/uL (ref 4.0–10.5)
nRBC: 0 % (ref 0.0–0.2)

## 2023-01-02 LAB — TROPONIN I (HIGH SENSITIVITY): Troponin I (High Sensitivity): 20 ng/L — ABNORMAL HIGH (ref <18)

## 2023-01-02 LAB — BRAIN NATRIURETIC PEPTIDE: B Natriuretic Peptide: 469.4 pg/mL — ABNORMAL HIGH (ref 0.0–100.0)

## 2023-01-02 NOTE — ED Provider Notes (Signed)
[place] [facility] AT [facility] Provider Note   CSN: [id number] Arrival date & time: 01/02/23  4098     History  Chief Complaint  Patient presents with   Shortness of Breath    Kristen Pham is a 89 y.o. female.  89 yo F with a chief complaint of difficulty breathing.  The patient was just admitted to the hospital for a CHF exacerbation.  She said that she had been doing well and had been in rehab.  She supposed to go back [facility] in [facility] days.  She said that she was having a little trouble breathing and she was having trouble sleeping and so she called the nurse to try and get her blood pressure checked and she did not feel like she came and eventually EMS was called.   Shortness of Breath      Home Medications Prior to Admission medications   Medication Sig Start Date End Date Taking? Authorizing Provider  acetaminophen (TYLENOL) 500 MG tablet Take 500 mg by mouth at bedtime.    [provider]  atorvastatin (LIPITOR) 20 MG tablet Take 1 tablet (20 mg total) by mouth daily. 05/19/22 12/19/22  Iran Ouch, MD  diclofenac Sodium (VOLTAREN) 1 % GEL Apply 2 g topically 4 (four) times daily. Apply to right shoulder. 12/22/22   Arrien, York Ram, MD  escitalopram (LEXAPRO) 5 MG tablet Take 5 mg by mouth daily. 09/22/22   [provider]  furosemide (LASIX) 20 MG tablet Take 1 tablet (20 mg total) by mouth daily as needed for fluid or edema. 12/22/22   Arrien, York Ram, MD  gabapentin (NEURONTIN) 100 MG capsule Take 1 capsule (100 mg total) by mouth 2 (two) times daily. 12/22/22   Arrien, York Ram, MD  irbesartan (AVAPRO) 150 MG tablet Take 1 tablet (150 mg total) by mouth daily. 12/23/22 01/22/23  Arrien, York Ram, MD  MAGNESIUM PO Take 1 tablet by mouth daily as needed.    [provider]  metoprolol succinate (TOPROL-XL) 25 MG 24 hr tablet Take 1 tablet (25 mg total) by mouth daily. 12/23/22 01/22/23   Arrien, York Ram, MD  NONFORMULARY OR COMPOUNDED ITEM Take 1 drop by mouth daily. CBD Drops.    [provider]  spironolactone (ALDACTONE) 25 MG tablet Take 0.5 tablets (12.5 mg total) by mouth daily. 12/23/22 01/22/23  Arrien, York Ram, MD  traMADol (ULTRAM) 50 MG tablet Take 1 tablet (50 mg total) by mouth every 6 (six) hours as needed. 12/22/22   Arrien, York Ram, MD      Allergies    Ivp dye [iodinated contrast media]    Review of Systems   Review of Systems  Respiratory:  Positive for shortness of breath.     Physical Exam Updated Vital Signs BP (!) 158/69   Pulse 71   Temp (!) 96.9 F (36.1 C)   Resp 11   SpO2 100%  Physical Exam Vitals and nursing note reviewed.  Constitutional:      General: She is not in acute distress.    Appearance: She is well-developed. She is not diaphoretic.  HENT:     Head: Normocephalic and atraumatic.  Eyes:     Pupils: Pupils are equal, round, and reactive to light.  Cardiovascular:     Rate and Rhythm: Normal rate and regular rhythm.     Heart sounds: No murmur heard.    No friction rub. No gallop.  Pulmonary:     Effort:  Pulmonary effort is normal.     Breath sounds: No wheezing or rales.  Abdominal:     General: There is no distension.     Palpations: Abdomen is soft.     Tenderness: There is no abdominal tenderness.  Musculoskeletal:        General: No tenderness.     Cervical back: Normal range of motion and neck supple.  Skin:    General: Skin is warm and dry.  Neurological:     Mental Status: She is alert and oriented to person, place, and time.  Psychiatric:        Behavior: Behavior normal.     ED Results / Procedures / Treatments   Labs (all labs ordered are listed, but only abnormal results are displayed) Labs Reviewed  CBC WITH DIFFERENTIAL/PLATELET - Abnormal; Notable for the following components:      Result Value   Platelets 483 (*)    All other components within normal limits   COMPREHENSIVE METABOLIC PANEL - Abnormal; Notable for the following components:   Glucose, Bld 103 (*)    Albumin 3.0 (*)    GFR, Estimated 59 (*)    All other components within normal limits  BRAIN NATRIURETIC PEPTIDE - Abnormal; Notable for the following components:   B Natriuretic Peptide 469.4 (*)    All other components within normal limits  TROPONIN I (HIGH SENSITIVITY) - Abnormal; Notable for the following components:   Troponin I (High Sensitivity) 20 (*)    All other components within normal limits    EKG None  Radiology DG Chest Port 1 View Result Date: 01/02/2023 CLINICAL DATA:  89 year old female with history of shortness of breath. EXAM: PORTABLE CHEST 1 VIEW COMPARISON:  Chest x-ray 12/18/2022. FINDINGS: Lung volumes are normal. No consolidative airspace disease. No pleural effusions. No pneumothorax. No pulmonary nodule or mass noted. Pulmonary vasculature and the cardiomediastinal silhouette are within normal limits. Atherosclerosis in the thoracic aorta. IMPRESSION: 1.  No radiographic evidence of acute cardiopulmonary disease. 2. Aortic atherosclerosis. Electronically Signed   By: Trudie Reed M.D.   On: 01/02/2023 06:09    Procedures Procedures    Medications Ordered in ED Medications - No data to display  ED Course/ Medical Decision Making/ A&P                                 Medical Decision Making Amount and/or Complexity of Data Reviewed Labs: ordered. Radiology: ordered. ECG/medicine tests: ordered.   89 yo F with a chief complaints of difficulty breathing.  This happened last night.  On record review the patient was just in the hospital.  Was admitted for CHF exacerbation.  Will obtain a laboratory evaluation.  Chest x-ray.  Chest x-ray independently interpreted by me without focal infiltrate or pneumothorax.  CBC with anemia, no leukocytosis, no significant electrolyte abnormalities.  Troponin is slightly below last check.  Her BNP is about  half what it was when she came into the hospital.  I discussed results with the patient.  Her shortness of breath has resolved.  Will discharge the patient home.  PCP follow-up.  6:50 AM:  I have discussed the diagnosis/risks/treatment options with the patient and family.  Evaluation and diagnostic testing in the emergency department does not suggest an emergent condition requiring admission or immediate intervention beyond what has been performed at this time.  They will follow up with PCP. We also discussed returning to  the ED immediately if new or worsening sx occur. We discussed the sx which are most concerning (e.g., sudden worsening pain, fever, inability to tolerate by mouth) that necessitate immediate return. Medications administered to the patient during their visit and any new prescriptions provided to the patient are listed below.  Medications given during this visit Medications - No data to display   The patient appears reasonably screen and/or stabilized for discharge and I doubt any other medical condition or other [facility] requiring further screening, evaluation, or treatment in the ED at this time prior to discharge.          Final Clinical Impression(s) / ED Diagnoses Final diagnoses:  Shortness of breath    Rx / DC Orders ED Discharge Orders     None         Melene Plan, DO 01/02/23 [phone]

## 2023-01-02 NOTE — ED Triage Notes (Signed)
Pt arrives via [facility] from [facility]. Per report, EMS was  called out for CP and SOB (acute onset 1.5 hours ago). Pt reported to EMS that she never had CP, only SOB. Some disorientation at baseline. 212/93, 75, nsr, 99% ra, cbg 118, 325 asa PO prior to arrival. IV placed in the RFA.

## 2023-01-02 NOTE — Discharge Instructions (Signed)
Follow up with your doctor in the office.  Return for worsening difficulty.

## 2023-01-04 ENCOUNTER — Encounter: Payer: Medicare Other | Attending: Physical Medicine & Rehabilitation | Admitting: Physical Medicine & Rehabilitation

## 2023-01-04 ENCOUNTER — Encounter: Payer: Self-pay | Admitting: Physical Medicine & Rehabilitation

## 2023-01-04 VITALS — BP 186/73 | HR 72 | Ht 60.0 in | Wt 99.0 lb

## 2023-01-04 DIAGNOSIS — Z79891 Long term (current) use of opiate analgesic: Secondary | ICD-10-CM | POA: Insufficient documentation

## 2023-01-04 DIAGNOSIS — M25561 Pain in right knee: Secondary | ICD-10-CM | POA: Diagnosis not present

## 2023-01-04 DIAGNOSIS — G894 Chronic pain syndrome: Secondary | ICD-10-CM | POA: Diagnosis not present

## 2023-01-04 DIAGNOSIS — G8929 Other chronic pain: Secondary | ICD-10-CM | POA: Insufficient documentation

## 2023-01-04 DIAGNOSIS — M25562 Pain in left knee: Secondary | ICD-10-CM | POA: Insufficient documentation

## 2023-01-04 MED ORDER — TRIAMCINOLONE ACETONIDE 32 MG IX SRER
32.0000 mg | Freq: Once | INTRA_ARTICULAR | Status: AC
  Administered 2023-01-04: 32 mg via INTRA_ARTICULAR

## 2023-01-04 NOTE — Progress Notes (Signed)
Subjective:    Patient ID: Kristen Pham, female    DOB: 05-26-1924, 89 y.o.   MRN: [id number]  HPI   HPI     Kristen Pham is a 89 y.o. year old female  who  has a past medical history of Carpal tunnel syndrome and Hypertension.   They are presenting to PM&R clinic as a new patient for pain management evaluation. They were referred by by Dr. Timothy Pham for treatment of pain in her toe, CPRS history pain.  She is here with her daughter who assist with history.  She has had pain for many years related to osteoarthritis that is in multiple joints throughout her body.  OA is most significant in her knees.  She has had cortisone, Supartz injections for her knees in the past.  She has also had right knee nerve ablation.  She had a Kenalog injection in her hip.  She also has pain in her shoulders.  She would develop pain in her right third toe after she had a corn removed in 2018.  This resulted in an infection.  She was seen by Dr. Eual Pham but told she did not need a amputation at this location.  Daughter reports that she has complained about pain throughout her legs.  She will sometimes report that the pain in her toe will radiate throughout her legs.  daughter reports that she will often have pain exacerbations and that sometimes provides varying descriptions of her pain.  Pain has generally been better for the past week or two.     Patient reports that today her pain is the worst in the third toe on the right.  This is worsened with ambulation.  Pain is tingling and stabbing.   She uses CBD cream for pain that will sometimes occur on the left ball of her foot.   Pain is improved with tramadol as needed and gabapentin 100 mg 3 times daily.   She has been recently treated with doxycycline for possible infection in her toe.   She has previously been seen by pain management.  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   Corn removed from R foot 2018. Resulted  in an infection. Seen by Dr. Lajoyce Pham- no ampulation needed.  Radiating pain up her leg.  Pain throughout the legs     Red flag symptoms: No red flags for back pain endorsed in Hx or ROS   Medications tried: Topical medications  CBD cream , roll on anesthetic, voltaren gel, Frankincense and myrhh  Nsaids Advil- helps  Tylenol- Helps a little  Opiates  Tramadol- helps pain Gabapentin - gabapentin helps pain Lyrica- possibly had side effect, doesn't remember      Other treatments: PT- Completed few weeks ago  TENs unit denies use Injections right knee cortisone, Supartz, right knee nerve ablation Right hip cortisone injection    Interval history 12/14/22 Patient is here for follow-up regarding her chronic pain.  Greatest pain is in her right knee and right third toe.  She continues to report pain deep to her toe that has multiple categories of pain such as burning, stabbing, aching.  Overall it sounds that pain has been a little better since her last visit.  She continues to use Tylenol and Advil as needed.  She also uses gabapentin 100 mg 2-3 times a day which has also been helping to reduce her pain.  Pain continues to be limiting and some days she wants to  not be active due to the pain.  She does also have a lot of pain in her right knee.  Prior gel injections, cortisone injections, nerve ablation.  Steroid injections helped but not for as long as she would like them to help.   Interval history 01/04/23 Patient here for R knee zilretta injection.  She continues to have R knee pain. Prior cortisone injections helped, but lasted a few week.  Recent admission for CHF exacerbation, currently at SNF. She is here with her daugher. Occasional hip pain continues to be be overall controlled.  Tramadol prn continues to help her pain.    Pain Inventory Average Pain 7 Pain Right Now 7 My pain is sharp, burning, dull, tingling, and aching  In the last 24 hours, has pain interfered with the  following? General activity 5 Relation with others 1 Enjoyment of life 5 What TIME of day is your pain at its worst? varies Sleep (in general) Fair  Pain is worse with: walking and standing Pain improves with: heat/ice and medication Relief from Meds: 5  Family History  Problem Relation Age of Onset   Cerebral aneurysm Father    Social History   Socioeconomic History   Marital status: Widowed    Spouse name: Not on file   Number of children: 1   Years of education: associates   Highest education level: Not on file  Occupational History   Occupation: retired   Tobacco Use   Smoking status: Never   Smokeless tobacco: Never  Vaping Use   Vaping status: Never Used  Substance and Sexual Activity   Alcohol use: Yes    Alcohol/week: 0.0 standard drinks of alcohol    Comment: occasionally    Drug use: No   Sexual activity: Not on file  Other Topics Concern   Not on file  Social History Narrative   Lives at home alone   Drinks coke and coffee occasionally    Two story home   Right handed   Social Drivers of Health   Financial Resource Strain: Not on file  Food Insecurity: No Food Insecurity (12/18/2022)   Hunger Vital Sign    Worried About Running Out of Food in the Last Year: Never true    Pham Out of Food in the Last Year: Never true  Transportation Needs: No Transportation Needs (12/18/2022)   PRAPARE - Administrator, Civil Service (Medical): No    Lack of Transportation (Non-Medical): No  Physical Activity: Not on file  Stress: Not on file  Social Connections: Unknown (09/30/2021)   Received from [facility]   Social Network    Social Network: Not on file   Past Surgical History:  Procedure Laterality Date   ABDOMINAL HYSTERECTOMY     CESAREAN SECTION     HAND SURGERY     bilateral release    TONSILECTOMY, ADENOIDECTOMY, BILATERAL MYRINGOTOMY AND TUBES     Past Surgical History:  Procedure Laterality Date   ABDOMINAL  HYSTERECTOMY     CESAREAN SECTION     HAND SURGERY     bilateral release    TONSILECTOMY, ADENOIDECTOMY, BILATERAL MYRINGOTOMY AND TUBES     Past Medical History:  Diagnosis Date   Anemia    Carpal tunnel syndrome    CHF (congestive heart failure) (HCC)    Depression    Hypertension    Osteoarthritis    PVD (peripheral vascular disease) (HCC)    BP (!) 186/73   Pulse 72  Ht 5' (1.524 m)   Wt 99 lb (44.9 kg)   SpO2 98%   BMI 19.33 kg/m   Opioid Risk Score:   Fall Risk Score:  `1  Depression screen [facility] 2/9     12/14/2022   11:28 AM 10/15/2022   10:29 AM 08/01/2015   10:23 AM 08/01/2015   10:08 AM 02/07/2014    9:49 AM  Depression screen PHQ 2/9  Decreased Interest 0 2 0 0 0  Down, Depressed, Hopeless 0 3 0 0 0  PHQ - 2 Score 0 5 0 0 0  Altered sleeping  3     Tired, decreased energy  3     Change in appetite  1     Feeling bad or failure about yourself   3     Trouble concentrating  2     Moving slowly or fidgety/restless  0     Suicidal thoughts  1     PHQ-9 Score  18     Difficult doing work/chores  Very difficult         Review of Systems  Musculoskeletal:  Positive for gait problem.  All other systems reviewed and are negative.      Objective:   Physical Exam  Gen: no distress, normal appearing, appears younger than stated age HEENT: oral mucosa pink and moist, NCAT Chest: normal effort, normal rate of breathing Abd: soft, non-distended Ext: no edema Psych: Very pleasant Skin: intact Neuro: Alert and awake, follows commands, cranial nerves II through XII grossly intact Limited ability to describe her pain and prior treatments--daughter provides assistance Strength  4 to 4+ out of 5 throughout bilateral upper lower extremities Sensation intact light touch in all 4 extremities  Musculoskeletal:   R knee joint TTP Arthritic changes in bilateral hands            R shoulder xray 03/06/22 IMPRESSION: 1. No fracture or dislocation of the  right shoulder. 2. Severe acromioclavicular and glenohumeral arthrosis. 3. High riding position of the humeral head, suggesting chronic rotator cuff tear.   R knee xray 03/06/22 FINDINGS: Tricompartmental degenerative changes of the right knee joint are seen. No joint effusion is noted. No acute fracture or dislocation is seen. Vascular calcifications are noted.   IMPRESSION: Degenerative change without acute abnormality.         Assessment & Plan:  1) Right third toe pain with history of osteomyelitis.  She is on doxycycline per orthopedics.   She also plans to follow-up with Dr. Eual Pham to discuss possibility of amputation. 2) Peripheral arterial disease 3) Osteoarthritis right hip, bilateral knees right greater than left 4) Right shoulder osteoarthritis 5) Prior history of CPRS/neuropathy.  Patient is limited historian, unable to confirm diagnosis however will continue to monitor    Plan -Pain agreement prior visit -Tramadol 50 mg Q6h PRN  -Continue gabapentin 100-200 mg 3 times daily -Prior UDS with THC-suspect this related to CBD product that she is taking as needed.  She says she will discontinue use. -Pain Journal -Recently completed physical therapy with benefit reported for her function- she is now at SNF -TENS unit, zynex nexwave was ordered last visit- try for R knee advised -Caution to change medications as she is sensitive to medications in regards to cognition -Duloxetine may be an option to try at a later time - Zilretta injection today     R knee injection   Indication:Knee pain not relieved by medication management and other conservative care.  Informed consent  was obtained after describing risks and benefits of the procedure with the patient, this includes bleeding, bruising, infection and medication side effects. The patient wishes to proceed and has given written consent. The patient was placed in a recumbent position. The Lateral aspect of the knee was  marked and prepped with Betadine and alcohol. It was then entered with a 25-gauge 1-1/2 inch needle.  After negative draw back for blood, a solution containing 5 mL of Zilretta was injected. The patient tolerated the procedure well. Post procedure instructions were given.

## 2023-01-11 ENCOUNTER — Encounter (HOSPITAL_COMMUNITY): Payer: Self-pay

## 2023-01-11 ENCOUNTER — Emergency Department (HOSPITAL_COMMUNITY)
Admission: EM | Admit: 2023-01-11 | Discharge: 2023-01-11 | Disposition: A | Discharge status: Home / Self Care | Payer: Medicare Other | Attending: Emergency Medicine | Admitting: Emergency Medicine

## 2023-01-11 ENCOUNTER — Other Ambulatory Visit: Payer: Self-pay

## 2023-01-11 DIAGNOSIS — I10 Essential (primary) hypertension: Secondary | ICD-10-CM

## 2023-01-11 DIAGNOSIS — I509 Heart failure, unspecified: Secondary | ICD-10-CM | POA: Insufficient documentation

## 2023-01-11 DIAGNOSIS — I110 Hypertensive heart disease with heart failure: Secondary | ICD-10-CM | POA: Diagnosis present

## 2023-01-11 DIAGNOSIS — Z79899 Other long term (current) drug therapy: Secondary | ICD-10-CM | POA: Diagnosis not present

## 2023-01-11 MED ORDER — CLONIDINE HCL 0.1 MG PO TABS: 0.1000 mg | ORAL_TABLET | Freq: Two times a day (BID) | ORAL | 0 refills | Status: DC | PRN

## 2023-01-11 NOTE — ED Triage Notes (Addendum)
Patient arrived by [facility] from [facility] for fluctuating BP since starting new meds 2 weeks ago.  No complaints-alert and . Seen a few days ago per EMS for same

## 2023-01-11 NOTE — Discharge Instructions (Signed)
Blood Pressure Management Instructions:  -Continue your normal daily blood pressure medications -Have your blood pressure checked once daily at the same time.  Write down and record your blood pressure measurements every day and a diary to take to your doctor's office. -If your systolic blood pressure the top number is above 200 mmhg, try to rest or relax in a calm room, with both feet resting on the ground, and recheck your blood pressure 10 minutes later on the other arm. -If your blood pressure remains elevated above systolic (top number), you can take 1 clonidine pill 0.1 mg as a "rescue medicine".  This medicine will lower your blood pressure.  -If you have of high blood pressure with a bad headache, stroke symptoms, chest pain or difficulty breathing, call 911 to return to the ER.  -However, if you have high blood pressure but you do not have any of the symptoms, it is reasonable to manage it at home by taking the rescue medicine and trying to relax.  Blood pressures are fickle and can rise and fall throughout the day.  Typically blood pressure is at the lowest in bed at night, when sleeping, and when waking up in the morning.  It is highest in the afternoon and periods of activity.

## 2023-01-11 NOTE — ED Provider Notes (Signed)
[place] EMERGENCY DEPARTMENT AT [facility] Provider Note   CSN: [id number] Arrival date & time: 01/11/23  7829     History  No chief complaint on file.   Kristen Pham is a 89 y.o. female with a history of hypertension, congestive heart failure, presented to ED from her independent  living at [facility] with concern for labile blood pressure.  The patient's daughter reports that a CNA came to check the patient and check her blood pressure today and it was elevated over 200, therefore they advise she go to the ER.  The patient reports he is asymptomatic.  She denies lightheadedness.  She denies chest pain or shortness of breath.  She was seen in the ED 9 days ago with some report of difficulty breathing and had a CHF workup which was nonemergent, and was able to be discharged back to her facility.  The patient is on atenolol, irbesartan, and spironolactone.  She has a cardiology appointment coming up in about 2 weeks per her daughter's report.  HPI     Home Medications Prior to Admission medications   Medication Sig Start Date End Date Taking? Authorizing Provider  cloNIDine (CATAPRES) 0.1 MG tablet Take 1 tablet (0.1 mg total) by mouth 2 (two) times daily as needed for up to 30 doses. For persistent systolic blood pressure (top number) above 200 mmhg on repeat testing (separate instructions provided) 01/11/23  Yes Tarron Krolak, Kermit Balo, MD  acetaminophen (TYLENOL) 500 MG tablet Take 500 mg by mouth at bedtime.    [provider]  atorvastatin (LIPITOR) 20 MG tablet Take 1 tablet (20 mg total) by mouth daily. 05/19/22 12/19/22  Iran Ouch, MD  diclofenac Sodium (VOLTAREN) 1 % GEL Apply 2 g topically 4 (four) times daily. Apply to right shoulder. 12/22/22   Arrien, York Ram, MD  escitalopram (LEXAPRO) 5 MG tablet Take 5 mg by mouth daily. 09/22/22   [provider]  furosemide (LASIX) 20 MG tablet Take 1 tablet (20 mg total) by mouth daily as  needed for fluid or edema. 12/22/22   Arrien, York Ram, MD  gabapentin (NEURONTIN) 100 MG capsule Take 1 capsule (100 mg total) by mouth 2 (two) times daily. 12/22/22   Arrien, York Ram, MD  irbesartan (AVAPRO) 150 MG tablet Take 1 tablet (150 mg total) by mouth daily. 12/23/22 01/22/23  Arrien, York Ram, MD  MAGNESIUM PO Take 1 tablet by mouth daily as needed.    [provider]  metoprolol succinate (TOPROL-XL) 25 MG 24 hr tablet Take 1 tablet (25 mg total) by mouth daily. 12/23/22 01/22/23  Arrien, York Ram, MD  NONFORMULARY OR COMPOUNDED ITEM Take 1 drop by mouth daily. CBD Drops.    [provider]  spironolactone (ALDACTONE) 25 MG tablet Take 0.5 tablets (12.5 mg total) by mouth daily. 12/23/22 01/22/23  Arrien, York Ram, MD  traMADol (ULTRAM) 50 MG tablet Take 1 tablet (50 mg total) by mouth every 6 (six) hours as needed. 12/22/22   Arrien, York Ram, MD      Allergies    Ivp dye [iodinated contrast media]    Review of Systems   Review of Systems  Physical Exam Updated Vital Signs BP (!) 205/81 (BP Location: Left Arm)   Pulse 84   Temp 98.4 F (36.9 C) (Oral)   Resp 18   SpO2 100%  Physical Exam Constitutional:      General: She is not in acute distress. HENT:     Head:  Normocephalic and atraumatic.  Eyes:     Conjunctiva/sclera: Conjunctivae normal.     Pupils: Pupils are equal, round, and reactive to light.  Cardiovascular:     Rate and Rhythm: Normal rate and regular rhythm.  Pulmonary:     Effort: Pulmonary effort is normal. No respiratory distress.  Abdominal:     General: There is no distension.     Tenderness: There is no abdominal tenderness.  Skin:    General: Skin is warm and dry.  Neurological:     General: No focal deficit present.     Mental Status: She is alert. Mental status is at baseline.  Psychiatric:        Mood and Affect: Mood normal.        Behavior: Behavior normal.     ED Results /  Procedures / Treatments   Labs (all labs ordered are listed, but only abnormal results are displayed) Labs Reviewed - No data to display  EKG None  Radiology No results found.  Procedures Procedures    Medications Ordered in ED Medications - No data to display  ED Course/ Medical Decision Making/ A&P                                 Medical Decision Making  Patient is presenting with asymptomatic hypertension.  No evidence of hypertensive emergency.  No evidence or report of for worsening congestive heart failure.  She is not hypoxic.  I do not see an indication to repeat blood testing at this time.  Supplemental history is provided by the patient's daughter at bedside.  Her daughter and I are both in agreement to avoid further unnecessary testing on this patient who is otherwise asymptomatic and wanting to leave the hospital.  I will provide written parameters for blood pressure control and directions for the habits with staff to follow.  In brief we will continue her current blood pressure medications, if her pressure remains elevated they will have her relax and recheck the blood pressure on the other arm.  If it remains persistently elevated over 200 we will prescribe clonidine 0.1 mg as a rescue medication, but I would not change her baseline medications, as there is some reported lability in her daily blood pressure.  Her daughter says her typical systolic pressure is around 150-160 mmhg, but I suspect the pressure likely drops overnight.        Final Clinical Impression(s) / ED Diagnoses Final diagnoses:  Asymptomatic hypertension    Rx / DC Orders ED Discharge Orders          Ordered    cloNIDine (CATAPRES) 0.1 MG tablet  2 times daily PRN        01/11/23 1011              Terald Sleeper, MD 01/11/23 1012

## 2023-01-14 ENCOUNTER — Encounter: Payer: Self-pay | Admitting: Cardiovascular Disease

## 2023-01-14 NOTE — Telephone Encounter (Signed)
I am okay if we refill irbesartan and spironolactone under my name.

## 2023-01-15 ENCOUNTER — Other Ambulatory Visit: Payer: Self-pay

## 2023-01-15 MED ORDER — SPIRONOLACTONE 25 MG PO TABS: 12.5000 mg | ORAL_TABLET | Freq: Every day | ORAL | 3 refills | Status: DC

## 2023-01-15 MED ORDER — IRBESARTAN 150 MG PO TABS: 150.0000 mg | ORAL_TABLET | Freq: Every day | ORAL | 3 refills | Status: DC

## 2023-01-25 NOTE — Progress Notes (Signed)
 Cardiology Office Note   Date:  01/26/2023   ID:  Kristen Pham, DOB 1924/11/11, MRN [id number]  PCP:  Onita Rush, MD  Cardiologist:   Deatrice Cage, MD   No chief complaint on file.     History of Present Illness: Kristen Pham is a 89 y.o. female who is here for a follow-up visit regarding chronic diastolic heart failure,  aortic stenosis and peripheral arterial disease. She has known history of essential hypertension, hyperlipidemia and arthritis.  She is not a smoker and does not have diabetes. She was seen by me in 2017 for leg pain with concerns for peripheral arterial disease. She underwent noninvasive vascular studies in 2017 which showed noncompressible vessels with moderate SFA disease bilaterally.  There was no evidence of obstructive disease at that time. She underwent cardiac evaluation in 2022 due to worsening exertional dyspnea.  Echocardiogram was done in March 2022 and showed  normal LV systolic function with mild aortic stenosis with mean gradient of 11 mmHg.  Lexiscan  Myoview  showed no evidence of ischemia with normal ejection fraction.  She had repeat Doppler studies in September 2023 which showed an ABI of 0.69 on the right and 0.77 on the left.  She was hospitalized in November with heart failure.  She was hypoxic and tachypneic.  BNP was mildly elevated and her blood pressure was extremely high.  Echocardiogram showed normal LV systolic function, moderate mitral regurgitation and mild aortic stenosis. She improved with IV diuresis.  She was switched from amlodipine and atenolol to irbesartan , spironolactone  and Toprol . She continued to have intermittent episodes of elevated blood pressure and had 2 emergency room visits for high blood pressure.  She was prescribed clonidine  to be used as needed. Her symptoms include orthopnea and PND with extremely elevated blood pressure.  This is still present now but has significantly improved.  She only had to use  clonidine  3 times.  She uses furosemide  as needed.  No chest pain.   Past Medical History:  Diagnosis Date   Anemia    Carpal tunnel syndrome    CHF (congestive heart failure) (HCC)    Depression    Hypertension    Osteoarthritis    PVD (peripheral vascular disease) (HCC)     Past Surgical History:  Procedure Laterality Date   ABDOMINAL HYSTERECTOMY     CESAREAN SECTION     HAND SURGERY     bilateral release    TONSILECTOMY, ADENOIDECTOMY, BILATERAL MYRINGOTOMY AND TUBES       Current Outpatient Medications  Medication Sig Dispense Refill   acetaminophen  (TYLENOL ) 500 MG tablet Take 500 mg by mouth at bedtime.     cloNIDine  (CATAPRES ) 0.1 MG tablet Take 1 tablet (0.1 mg total) by mouth 2 (two) times daily as needed for up to 30 doses. For persistent systolic blood pressure (top number) above 200 mmhg on repeat testing (separate instructions provided) 30 tablet 0   diclofenac  Sodium (VOLTAREN ) 1 % GEL Apply 2 g topically 4 (four) times daily. Apply to right shoulder. 50 g 0   escitalopram  (LEXAPRO ) 5 MG tablet Take 5 mg by mouth daily.     furosemide  (LASIX ) 20 MG tablet Take 1 tablet (20 mg total) by mouth daily as needed for fluid or edema. 30 tablet 0   gabapentin  (NEURONTIN ) 100 MG capsule Take 1 capsule (100 mg total) by mouth 2 (two) times daily.     irbesartan  (AVAPRO ) 150 MG tablet Take 1 tablet (150 mg total) by mouth daily.  90 tablet 3   MAGNESIUM  PO Take 1 tablet by mouth daily as needed.     spironolactone  (ALDACTONE ) 25 MG tablet Take 0.5 tablets (12.5 mg total) by mouth daily. 45 tablet 3   traMADol  (ULTRAM ) 50 MG tablet Take 1 tablet (50 mg total) by mouth every 6 (six) hours as needed. 10 tablet 0   atorvastatin  (LIPITOR) 20 MG tablet Take 1 tablet (20 mg total) by mouth daily. 90 tablet 3   metoprolol  succinate (TOPROL -XL) 25 MG 24 hr tablet Take 1 tablet (25 mg total) by mouth daily. 30 tablet 0   NONFORMULARY OR COMPOUNDED ITEM Take 1 drop by mouth daily. CBD  Drops.     Current Facility-Administered Medications  Medication Dose Route Frequency Provider Last Rate Last Admin   triamcinolone  acetonide (KENALOG ) 10 MG/ML injection 10 mg  10 mg Other Once Stover, Titorya, DPM        Allergies:   Ivp dye [iodinated contrast media]    Social History:  The patient  reports that she has never smoked. She has never used smokeless tobacco. She reports current alcohol use. She reports that she does not use drugs.   Family History:  The patient's family history includes Cerebral aneurysm in her father.    ROS:  Please see the history of present illness.   Otherwise, review of systems are positive for none.   All other systems are reviewed and negative.    PHYSICAL EXAM: VS:  BP (!) 144/68 (BP Location: Left Arm, Patient Position: Sitting, Cuff Size: Normal)   Pulse 81   Ht 5' (1.524 m)   Wt 96 lb (43.5 kg)   SpO2 99%   BMI 18.75 kg/m  , BMI Body mass index is 18.75 kg/m. GEN: Well nourished, well developed, in no acute distress  HEENT: normal  Neck: no JVD, carotid bruits, or masses Cardiac: RRR; no rubs, or gallops,no edema .2/ 6 systolic ejection murmur in the aortic area which is mid peaking. Respiratory:  clear to auscultation bilaterally, normal work of breathing GI: soft, nontender, nondistended, + BS. No abdominal bruit MS: no deformity or atrophy  Skin: warm and dry, no rash Neuro:  Strength and sensation are intact Psych: euthymic mood, full affect Vascular: Distal pulses are not palpable.  EKG:  EKG is ordered today. EKG showed : Sinus rhythm with Premature atrial complexes Minimal voltage criteria for LVH, may be normal variant ( Sokolow-Lyon )       Recent Labs: 12/21/2022: Magnesium  2.2 01/02/2023: ALT 32; B Natriuretic Peptide 469.4; BUN 20; Creatinine, Ser 0.90; Hemoglobin 12.4; Platelets 483; Potassium 4.2; Sodium 138    Lipid Panel No results found for: CHOL, TRIG, HDL, CHOLHDL, VLDL, LDLCALC,  LDLDIRECT    Wt Readings from Last 3 Encounters:  01/26/23 96 lb (43.5 kg)  01/04/23 99 lb (44.9 kg)  12/23/22 100 lb 15.5 oz (45.8 kg)           No data to display            ASSESSMENT AND PLAN:  1.  Peripheral arterial disease: She has moderately reduced ABI bilaterally with abnormal distal pulses.  Currently with no claudication and no open ulceration.  Continue medical therapy  2.  Aortic stenosis: This was mild on most recent echocardiogram.  3.  Essential hypertension: Her blood pressure improved significantly but still not optimal.  Given that most of her elevated blood pressure readings are in the early morning hours, I elected to change irbesartan  to  150 mg twice daily instead of 300 mg once daily.  In addition, I increase Toprol  to 50 mg once daily.  If blood pressure remains elevated, will plan on increasing spironolactone . I do not see evidence of volume overload by physical exam today and I think it is reasonable to continue with furosemide  as needed.  4.  Hyperlipidemia: Continue treatment with atorvastatin  20 mg daily.   Disposition:   FU with me in 3 months.  Signed,  Deatrice Cage, MD  01/26/2023 10:41 AM    [facility]

## 2023-01-26 ENCOUNTER — Ambulatory Visit: Payer: PPO | Attending: Cardiovascular Disease | Admitting: Cardiovascular Disease

## 2023-01-26 VITALS — BP 144/68 | HR 81 | Ht 60.0 in | Wt 96.0 lb

## 2023-01-26 DIAGNOSIS — I10 Essential (primary) hypertension: Secondary | ICD-10-CM | POA: Diagnosis not present

## 2023-01-26 DIAGNOSIS — I739 Peripheral vascular disease, unspecified: Secondary | ICD-10-CM | POA: Diagnosis not present

## 2023-01-26 DIAGNOSIS — E785 Hyperlipidemia, unspecified: Secondary | ICD-10-CM

## 2023-01-26 DIAGNOSIS — I359 Nonrheumatic aortic valve disorder, unspecified: Secondary | ICD-10-CM

## 2023-01-26 MED ORDER — METOPROLOL SUCCINATE ER 50 MG PO TB24: 50.0000 mg | ORAL_TABLET | Freq: Every day | ORAL | 1 refills | Status: DC

## 2023-01-26 MED ORDER — IRBESARTAN 150 MG PO TABS: 150.0000 mg | ORAL_TABLET | Freq: Two times a day (BID) | ORAL | 1 refills | Status: DC

## 2023-01-26 NOTE — Patient Instructions (Signed)
 Medication Instructions:  INCREASE the Irbesartan  (Avapro ) to 150 mg twice daily  INCREASE the Metoprolol  (Toprol ) to 50 mg once daily  *If you need a refill on your cardiac medications before your next appointment, please call your pharmacy*   Lab Work: None ordered If you have labs (blood work) drawn today and your tests are completely normal, you will receive your results only by: MyChart Message (if you have MyChart) OR A paper copy in the mail If you have any lab test that is abnormal or we need to change your treatment, we will call you to review the results.   Testing/Procedures: None ordered   Follow-Up: At [facility], you and your health needs are our priority.  As part of our continuing mission to provide you with exceptional heart care, we have created designated Provider Care Teams.  These Care Teams include your primary Cardiologist (physician) and Advanced Practice Providers (APPs -  Physician Assistants and Nurse Practitioners) who all work together to provide you with the care you need, when you need it.  We recommend signing up for the patient portal called MyChart.  Sign up information is provided on this After Visit Summary.  MyChart is used to connect with patients for Virtual Visits (Telemedicine).  Patients are able to view lab/test results, encounter notes, upcoming appointments, etc.  Non-urgent messages can be sent to your provider as well.   To learn more about what you can do with MyChart, go to [email or web address].    Your next appointment:   3 month(s)  Provider:   Dr. Arida

## 2023-02-03 ENCOUNTER — Encounter: Payer: Self-pay | Admitting: Cardiovascular Disease

## 2023-02-05 MED ORDER — SPIRONOLACTONE 25 MG PO TABS: 25.0000 mg | ORAL_TABLET | Freq: Every day | ORAL | 1 refills | Status: DC

## 2023-02-12 ENCOUNTER — Telehealth: Payer: Self-pay | Admitting: Cardiovascular Disease

## 2023-02-12 NOTE — Telephone Encounter (Signed)
New Message:   Patient is the facility [facility]. They need the correct dose and instructions for patient's Spironolactone faxed to them please.Marland Kitchen Please fax to [phone].

## 2023-02-12 NOTE — Telephone Encounter (Signed)
Faxed instruction for Spironolactone to [facility] attn Mitrina Knight.

## 2023-03-02 ENCOUNTER — Encounter: Payer: PPO | Attending: Physical Medicine & Rehabilitation | Admitting: Physical Medicine & Rehabilitation

## 2023-03-02 DIAGNOSIS — M25562 Pain in left knee: Secondary | ICD-10-CM | POA: Insufficient documentation

## 2023-03-02 DIAGNOSIS — G894 Chronic pain syndrome: Secondary | ICD-10-CM | POA: Insufficient documentation

## 2023-03-02 DIAGNOSIS — M25561 Pain in right knee: Secondary | ICD-10-CM | POA: Insufficient documentation

## 2023-03-02 DIAGNOSIS — Z79891 Long term (current) use of opiate analgesic: Secondary | ICD-10-CM | POA: Insufficient documentation

## 2023-03-02 DIAGNOSIS — G8929 Other chronic pain: Secondary | ICD-10-CM | POA: Insufficient documentation

## 2023-03-03 ENCOUNTER — Ambulatory Visit (INDEPENDENT_AMBULATORY_CARE_PROVIDER_SITE_OTHER): Payer: PPO | Admitting: Family

## 2023-03-03 ENCOUNTER — Encounter: Payer: Self-pay | Admitting: Family

## 2023-03-03 DIAGNOSIS — L97511 Non-pressure chronic ulcer of other part of right foot limited to breakdown of skin: Secondary | ICD-10-CM | POA: Diagnosis not present

## 2023-03-03 DIAGNOSIS — L84 Corns and callosities: Secondary | ICD-10-CM | POA: Diagnosis not present

## 2023-03-03 DIAGNOSIS — M79675 Pain in left toe(s): Secondary | ICD-10-CM

## 2023-03-03 DIAGNOSIS — G8929 Other chronic pain: Secondary | ICD-10-CM | POA: Diagnosis not present

## 2023-03-03 DIAGNOSIS — M205X2 Other deformities of toe(s) (acquired), left foot: Secondary | ICD-10-CM

## 2023-03-03 NOTE — Progress Notes (Signed)
Office Visit Note   Patient: Kristen Pham           Date of Birth: [birth year]           MRN: [id number] Visit Date: 03/03/2023              Requested by: Creola Corn, MD [address] [place],  [place] [address] PCP: Creola Corn, MD  Chief Complaint  Patient presents with   Right Foot - Pain      HPI: The patient is a 89 year old woman who resides at [facility] who presents today for evaluation of recurrence of ulceration in the second webspace to the second and third toe over the IP joint.  She has had some prolonged hospitalizations and had lost mobility.  She recently underwent 2 weeks of inpatient physical therapy at [facility] and during that time had greatly increased her activities and developed this area of ulceration from rubbing in the webspace  This has not been as painful as ulcers in the past have been for her in this area  No active drainage no redness they are not concern for infection  Assessment & Plan: Visit Diagnoses: No diagnosis found.  Plan: As the area is quite wet macerated encouraged him to pack the 2 wounds with silver cell bandages will consider using silicone toe spacers as well they have several on hand  Frances Furbish will be assisting with her home health will reach out to them reads of silver cell dressings  Follow-Up Instructions: No follow-ups on file.   Ortho Exam  Patient is alert, oriented, no adenopathy, well-dressed, normal affect, normal respiratory effort. On examination right foot in the second webspace there are 2 ulcers . to the second toe over the IP joint there is a 3 mm in diameter ulceration with maceration over the IP joint of the third toe there is a 5 mm in diameter ulceration with some surrounding maceration and hyperkeratotic tissue this ulcer is tender No erythema no edema  Imaging: No results found. No images are attached to the encounter.  Labs: Lab Results  Component Value Date   HGBA1C 5.9 (H) 05/15/2014    ESRSEDRATE 83 (H) 12/23/2022   ESRSEDRATE 61 (H) 12/02/2009   CRP 11.9 (H) 12/02/2009   LABURIC 6.4 12/23/2022     Lab Results  Component Value Date   ALBUMIN 3.0 (L) 01/02/2023   ALBUMIN 2.8 (L) 12/23/2022   ALBUMIN 2.9 (L) 12/22/2022    Lab Results  Component Value Date   MG 2.2 12/21/2022   MG 1.9 12/20/2022   No results found for: "VD25OH"  No results found for: "PREALBUMIN"    Latest Ref Rng & Units 01/02/2023    5:51 AM 12/23/2022   10:20 AM 12/19/2022    2:27 AM  CBC EXTENDED  WBC 4.0 - 10.5 K/uL 8.8  13.5  7.4   RBC 3.87 - 5.11 MIL/uL 4.21  4.07  3.69   Hemoglobin 12.0 - 15.0 g/dL 19.1  47.8  29.5   HCT 36.0 - 46.0 % 38.9  36.2  33.2   Platelets 150 - 400 K/uL 483  276  292   NEUT# 1.7 - 7.7 K/uL 6.4   5.5   Lymph# 0.7 - 4.0 K/uL 1.5   0.8      There is no height or weight on file to calculate BMI.  Orders:  No orders of the defined types were placed in this encounter.  No orders of the defined types were placed  in this encounter.    Procedures: No procedures performed  Clinical Data: No additional findings.  ROS:  All other systems negative, except as noted in the HPI. Review of Systems  Objective: Vital Signs: There were no vitals taken for this visit.  Specialty Comments:  No specialty comments available.  PMFS History: Patient Active Problem List   Diagnosis Date Noted   Essential hypertension 12/18/2022   Anxiety with depression 12/18/2022   Anemia 12/18/2022   Acute on chronic diastolic CHF (congestive heart failure) (HCC) 12/18/2022   Acute respiratory failure (HCC) 12/18/2022   PAD (peripheral artery disease) (HCC) 04/21/2022   Bilateral pseudophakia 02/17/2021   Corneal edema of right eye 02/17/2021   Neuralgia 11/07/2018   Sensorineural hearing loss (SNHL) of both ears 10/12/2018   Non-pressure chronic ulcer of other part of left foot limited to breakdown of skin (HCC) 01/05/2017   Acquired claw toe, left 10/07/2016   Hard  corn 10/07/2016   Chronic toe pain, left foot 08/25/2016   Meniscal cyst 08/23/2012   Knee pain, bilateral 06/21/2012   Primary osteoarthritis of both knees 06/21/2012   Generalized osteoarthritis of hand 06/21/2012   Past Medical History:  Diagnosis Date   Anemia    Carpal tunnel syndrome    CHF (congestive heart failure) (HCC)    Depression    Hypertension    Osteoarthritis    PVD (peripheral vascular disease) (HCC)     Family History  Problem Relation Age of Onset   Cerebral aneurysm Father     Past Surgical History:  Procedure Laterality Date   ABDOMINAL HYSTERECTOMY     CESAREAN SECTION     HAND SURGERY     bilateral release    TONSILECTOMY, ADENOIDECTOMY, BILATERAL MYRINGOTOMY AND TUBES     Social History   Occupational History   Occupation: retired   Tobacco Use   Smoking status: Never   Smokeless tobacco: Never  Vaping Use   Vaping status: Never Used  Substance and Sexual Activity   Alcohol use: Yes    Alcohol/week: 0.0 standard drinks of alcohol    Comment: occasionally    Drug use: No   Sexual activity: Not on file

## 2023-03-05 ENCOUNTER — Telehealth: Payer: Self-pay | Admitting: Orthopedic Surgery

## 2023-03-05 NOTE — Telephone Encounter (Signed)
Received call from Sharol Harness (PT) needing verbal orders  for wound care to be executed by (PT) 1 Wk 4 for (PT) and Once a week for dressing change.  The number to contact Rolm Gala is [phone]

## 2023-03-08 NOTE — Telephone Encounter (Signed)
I called and advised verbal ok for orders as requested below. Advised that we used silvercell in the office will call with any questions.

## 2023-03-16 ENCOUNTER — Encounter: Payer: Self-pay | Admitting: Physical Medicine & Rehabilitation

## 2023-03-17 ENCOUNTER — Telehealth: Payer: Self-pay | Admitting: Physical Medicine & Rehabilitation

## 2023-03-17 NOTE — Telephone Encounter (Signed)
 Patients daughter called in and informed us that she does not recall patient receiving shoulder injection

## 2023-03-23 ENCOUNTER — Other Ambulatory Visit: Payer: Self-pay

## 2023-03-23 ENCOUNTER — Encounter (HOSPITAL_COMMUNITY): Payer: Self-pay

## 2023-03-23 ENCOUNTER — Emergency Department (HOSPITAL_COMMUNITY)

## 2023-03-23 ENCOUNTER — Emergency Department (HOSPITAL_COMMUNITY)
Admission: EM | Admit: 2023-03-23 | Discharge: 2023-03-23 | Disposition: A | Discharge status: Home / Self Care | Attending: Emergency Medicine | Admitting: Emergency Medicine

## 2023-03-23 DIAGNOSIS — I959 Hypotension, unspecified: Secondary | ICD-10-CM | POA: Insufficient documentation

## 2023-03-23 DIAGNOSIS — I509 Heart failure, unspecified: Secondary | ICD-10-CM | POA: Diagnosis not present

## 2023-03-23 DIAGNOSIS — Z79899 Other long term (current) drug therapy: Secondary | ICD-10-CM | POA: Diagnosis not present

## 2023-03-23 DIAGNOSIS — R55 Syncope and collapse: Secondary | ICD-10-CM | POA: Insufficient documentation

## 2023-03-23 DIAGNOSIS — I110 Hypertensive heart disease with heart failure: Secondary | ICD-10-CM | POA: Insufficient documentation

## 2023-03-23 LAB — URINALYSIS, W/ REFLEX TO CULTURE (INFECTION SUSPECTED)
Bacteria, UA: NONE SEEN
Bilirubin Urine: NEGATIVE
Glucose, UA: NEGATIVE mg/dL
Hgb urine dipstick: NEGATIVE
Ketones, ur: NEGATIVE mg/dL
Leukocytes,Ua: NEGATIVE
Nitrite: NEGATIVE
Protein, ur: NEGATIVE mg/dL
Specific Gravity, Urine: 1.012 (ref 1.005–1.030)
pH: 6 (ref 5.0–8.0)

## 2023-03-23 LAB — CBC WITH DIFFERENTIAL/PLATELET
Abs Immature Granulocytes: 0.01 10*3/uL (ref 0.00–0.07)
Basophils Absolute: 0 10*3/uL (ref 0.0–0.1)
Basophils Relative: 1 %
Eosinophils Absolute: 0.3 10*3/uL (ref 0.0–0.5)
Eosinophils Relative: 5 %
HCT: 36.2 % (ref 36.0–46.0)
Hemoglobin: 11.6 g/dL — ABNORMAL LOW (ref 12.0–15.0)
Immature Granulocytes: 0 %
Lymphocytes Relative: 25 %
Lymphs Abs: 1.4 10*3/uL (ref 0.7–4.0)
MCH: 30.4 pg (ref 26.0–34.0)
MCHC: 32 g/dL (ref 30.0–36.0)
MCV: 95 fL (ref 80.0–100.0)
Monocytes Absolute: 0.6 10*3/uL (ref 0.1–1.0)
Monocytes Relative: 10 %
Neutro Abs: 3.3 10*3/uL (ref 1.7–7.7)
Neutrophils Relative %: 59 %
Platelets: 242 10*3/uL (ref 150–400)
RBC: 3.81 MIL/uL — ABNORMAL LOW (ref 3.87–5.11)
RDW: 14.3 % (ref 11.5–15.5)
WBC: 5.6 10*3/uL (ref 4.0–10.5)
nRBC: 0 % (ref 0.0–0.2)

## 2023-03-23 LAB — COMPREHENSIVE METABOLIC PANEL
ALT: 12 U/L (ref 0–44)
AST: 21 U/L (ref 15–41)
Albumin: 3.2 g/dL — ABNORMAL LOW (ref 3.5–5.0)
Alkaline Phosphatase: 71 U/L (ref 38–126)
Anion gap: 8 (ref 5–15)
BUN: 28 mg/dL — ABNORMAL HIGH (ref 8–23)
CO2: 25 mmol/L (ref 22–32)
Calcium: 8.4 mg/dL — ABNORMAL LOW (ref 8.9–10.3)
Chloride: 107 mmol/L (ref 98–111)
Creatinine, Ser: 1.42 mg/dL — ABNORMAL HIGH (ref 0.44–1.00)
GFR, Estimated: 34 mL/min — ABNORMAL LOW (ref >60)
Glucose, Bld: 102 mg/dL — ABNORMAL HIGH (ref 70–99)
Potassium: 4.2 mmol/L (ref 3.5–5.1)
Sodium: 140 mmol/L (ref 135–145)
Total Bilirubin: 0.6 mg/dL (ref 0.0–1.2)
Total Protein: 6.1 g/dL — ABNORMAL LOW (ref 6.5–8.1)

## 2023-03-23 LAB — TROPONIN I (HIGH SENSITIVITY)
Troponin I (High Sensitivity): 16 ng/L (ref <18)
Troponin I (High Sensitivity): 17 ng/L (ref <18)

## 2023-03-23 LAB — I-STAT CG4 LACTIC ACID, ED: Lactic Acid, Venous: 1 mmol/L (ref 0.5–1.9)

## 2023-03-23 LAB — TSH: TSH: 1.157 u[IU]/mL (ref 0.350–4.500)

## 2023-03-23 MED ORDER — SODIUM CHLORIDE 0.9 % IV BOLUS
500.0000 mL | Freq: Once | INTRAVENOUS | Status: AC
  Administered 2023-03-23: 500 mL via INTRAVENOUS

## 2023-03-23 NOTE — ED Triage Notes (Signed)
 C/O dizziness and responsive to pain only on scene. EMS states BP 90 systolic. Denies tachycardia. Pt arrives to baseline.

## 2023-03-23 NOTE — ED Notes (Signed)
 Pt assisted to bedside commode. Uses walker at baseline

## 2023-03-23 NOTE — ED Provider Notes (Signed)
 [place] [facility] AT [facility] Provider Note   CSN: [id number] Arrival date & time: 03/23/23  1430     History  Chief Complaint  Patient presents with   Hypotension    Kristen Pham is a 89 y.o. female.  The history is provided by the patient, medical records and the EMS personnel. No language interpreter was used.  Near Syncope This is a new problem. The current episode started 1 to 2 hours ago. The problem has been gradually improving. Pertinent negatives include no chest pain, no abdominal pain, no headaches and no shortness of breath. Nothing aggravates the symptoms. Nothing relieves the symptoms. She has tried nothing for the symptoms. The treatment provided no relief.       Home Medications Prior to Admission medications   Medication Sig Start Date End Date Taking? Authorizing Provider  acetaminophen (TYLENOL) 500 MG tablet Take 500 mg by mouth at bedtime.    [provider]  atorvastatin (LIPITOR) 20 MG tablet Take 1 tablet (20 mg total) by mouth daily. 05/19/22 12/19/22  Iran Ouch, MD  cloNIDine (CATAPRES) 0.1 MG tablet Take 1 tablet (0.1 mg total) by mouth 2 (two) times daily as needed for up to 30 doses. For persistent systolic blood pressure (top number) above 200 mmhg on repeat testing (separate instructions provided) 01/11/23   Trifan, Kermit Balo, MD  diclofenac Sodium (VOLTAREN) 1 % GEL Apply 2 g topically 4 (four) times daily. Apply to right shoulder. 12/22/22   Arrien, York Ram, MD  escitalopram (LEXAPRO) 5 MG tablet Take 5 mg by mouth daily. 09/22/22   [provider]  furosemide (LASIX) 20 MG tablet Take 1 tablet (20 mg total) by mouth daily as needed for fluid or edema. 12/22/22   Arrien, York Ram, MD  gabapentin (NEURONTIN) 100 MG capsule Take 1 capsule (100 mg total) by mouth 2 (two) times daily. 12/22/22   Arrien, York Ram, MD  irbesartan (AVAPRO) 150 MG tablet Take 1 tablet (150 mg total) by  mouth 2 (two) times daily. 01/26/23   Iran Ouch, MD  MAGNESIUM PO Take 1 tablet by mouth daily as needed.    [provider]  metoprolol succinate (TOPROL-XL) 50 MG 24 hr tablet Take 1 tablet (50 mg total) by mouth daily. 01/26/23 02/25/23  Iran Ouch, MD  NONFORMULARY OR COMPOUNDED ITEM Take 1 drop by mouth daily. CBD Drops.    [provider]  spironolactone (ALDACTONE) 25 MG tablet Take 1 tablet (25 mg total) by mouth daily. 02/05/23 03/07/23  Iran Ouch, MD  traMADol (ULTRAM) 50 MG tablet Take 1 tablet (50 mg total) by mouth every 6 (six) hours as needed. 12/22/22   Arrien, York Ram, MD      Allergies    Ivp dye [iodinated contrast media]    Review of Systems   Review of Systems  Constitutional:  Positive for fatigue. Negative for chills and fever.  HENT:  Negative for congestion.   Eyes:  Negative for visual disturbance.  Respiratory:  Negative for cough, chest tightness, shortness of breath and wheezing.   Cardiovascular:  Positive for near-syncope. Negative for chest pain.  Gastrointestinal:  Negative for abdominal pain, constipation, diarrhea, nausea and vomiting.  Genitourinary:  Negative for dysuria.  Musculoskeletal:  Negative for back pain and neck pain.  Skin:  Negative for rash and wound.  Neurological:  Positive for light-headedness. Negative for syncope, weakness and headaches.  Psychiatric/Behavioral:  Negative for agitation and confusion.  All other systems reviewed and are negative.   Physical Exam Updated Vital Signs BP (!) 143/62   Pulse 67   Temp 97.6 F (36.4 C)   Resp 13   Ht 5' (1.524 m)   Wt 45.4 kg   SpO2 98%   BMI 19.53 kg/m  Physical Exam Vitals and nursing note reviewed.  Constitutional:      General: She is not in acute distress.    Appearance: She is well-developed. She is not ill-appearing, toxic-appearing or diaphoretic.  HENT:     Head: Normocephalic and atraumatic.     Nose: No congestion or  rhinorrhea.     Mouth/Throat:     Mouth: Mucous membranes are dry.     Pharynx: No oropharyngeal exudate or posterior oropharyngeal erythema.  Eyes:     Extraocular Movements: Extraocular movements intact.     Conjunctiva/sclera: Conjunctivae normal.  Cardiovascular:     Rate and Rhythm: Normal rate and regular rhythm.     Heart sounds: Murmur heard.  Pulmonary:     Effort: Pulmonary effort is normal. No respiratory distress.     Breath sounds: Normal breath sounds. No wheezing, rhonchi or rales.  Chest:     Chest wall: No tenderness.  Abdominal:     General: Abdomen is flat.     Palpations: Abdomen is soft.     Tenderness: There is no abdominal tenderness. There is no guarding or rebound.  Musculoskeletal:        General: No swelling or tenderness.     Cervical back: Neck supple. No tenderness.     Right lower leg: No edema.     Left lower leg: No edema.  Skin:    General: Skin is warm and dry.     Capillary Refill: Capillary refill takes less than 2 seconds.     Findings: No erythema or rash.  Neurological:     General: No focal deficit present.     Mental Status: She is alert.  Psychiatric:        Mood and Affect: Mood normal.     ED Results / Procedures / Treatments   Labs (all labs ordered are listed, but only abnormal results are displayed) Labs Reviewed  CBC WITH DIFFERENTIAL/PLATELET - Abnormal; Notable for the following components:      Result Value   RBC 3.81 (*)    Hemoglobin 11.6 (*)    All other components within normal limits  COMPREHENSIVE METABOLIC PANEL - Abnormal; Notable for the following components:   Glucose, Bld 102 (*)    BUN 28 (*)    Creatinine, Ser 1.42 (*)    Calcium 8.4 (*)    Total Protein 6.1 (*)    Albumin 3.2 (*)    GFR, Estimated 34 (*)    All other components within normal limits  TSH  URINALYSIS, W/ REFLEX TO CULTURE (INFECTION SUSPECTED)  I-STAT CG4 LACTIC ACID, ED  TROPONIN I (HIGH SENSITIVITY)  TROPONIN I (HIGH  SENSITIVITY)    EKG EKG Interpretation Date/Time:  Tuesday March 23 2023 14:55:51 EST Ventricular Rate:  70 PR Interval:  149 QRS Duration:  93 QT Interval:  433 QTC Calculation: 468 R Axis:   46  Text Interpretation: Sinus rhythm Atrial premature complex Probable left ventricular hypertrophy when compared to prior, similar appearnce. No STEMI Confirmed by Theda Belfast ([id number]) on 03/23/2023 3:41:09 PM  Radiology No results found.  Procedures Procedures    Medications Ordered in ED Medications  sodium chloride 0.9 % bolus  500 mL (500 mLs Intravenous New Bag/Given 03/23/23 1500)    ED Course/ Medical Decision Making/ A&P                                 Medical Decision Making Amount and/or Complexity of Data Reviewed Labs: ordered. Radiology: ordered.    Kristen Pham is a 89 y.o. female with a past medical history significant for hypertension, CHF, anemia, depression, and osteoarthritis who presents with lightheadedness, near syncope, and hypotension.  According to patient, she was feeling well this morning but while ambulating with her walker she got lightheaded.  She reports she felt she was going to pass out and had to be guided to the ground.  She did not fall or hit her head.  She is denying any chest pain, shortness of breath, or palpitations.  She denies any nausea or vomiting.  EMS reportedly found her blood pressure was 90 systolic and she was minimally responsive.  They started some fluids and her blood pressure has responded and her mental status has improved during transport.  She reports no palpitations, nausea, vomiting, constipation, diarrhea, or urinary changes.  Denies leg pain or leg swelling.  Reports she has been doing well otherwise and is oriented to person place and time.  On exam, lungs were clear.  Chest nontender.  Abdomen nontender.  Patient moving extremities.  Patient is resting comfortably.  She still feels fatigued and tired but otherwise has  no complaints.  EKG does not show STEMI.  Vital signs here show improvement in her blood pressure.  Her mouth does look dry so we will give some more fluids and we will check some screening labs.  Will get workup to look for occult infection with chest x-ray and urinalysis.  As she did not hit her head and has no headache, will hold on imaging of the head initially.  Suspect possibly dehydration.  If workup reassuring and she is feeling better, anticipate she may be safe for discharge home.  Anticipate reassessment.  Care transferred to oncoming team to wait for workup results.  Anticipate shared decision-making conversation about near syncope and brief hypotension.  If she is feeling much better and workup reassuring, she may be stable for discharge home.  Care transferred in stable condition.         Final Clinical Impression(s) / ED Diagnoses Final diagnoses:  Near syncope  Hypotension, unspecified hypotension type      Clinical Impression: 1. Near syncope   2. Hypotension, unspecified hypotension type     Disposition: Care transferred oncoming team to wait for workup results and reassessment.  This note was prepared with assistance of Conservation officer, historic buildings. Occasional wrong-word or sound-a-like substitutions may have occurred due to the inherent limitations of voice recognition software.     Joley Utecht, Canary Brim, MD 03/23/23 [phone]

## 2023-03-23 NOTE — Discharge Instructions (Signed)
 Please continue your medications at home as prescribed and follow-up with your doctor.  Return to the ER for worsening symptoms.

## 2023-03-23 NOTE — ED Provider Notes (Signed)
 I was asked to follow-up on the patient's labs and the remainder of her imaging and to reevaluate for ultimate disposition.  Physical Exam  BP (!) 145/92   Pulse (!) 55   Temp 98 F (36.7 C)   Resp 15   Ht 5' (1.524 m)   Wt 45.4 kg   SpO2 100%   BMI 19.53 kg/m   Physical Exam Gen: NAD Neuro: no focal deficits  Procedures  Procedures  ED Course / MDM    Medical Decision Making Amount and/or Complexity of Data Reviewed Labs: ordered. Radiology: ordered.   The patient's labs here are reassuring.  Blood pressure is actually on the high side on reassessment.  The patient is back to her baseline and is ambulatory here without any issues at her baseline.  Her nursing care manager from her nursing facility is here as well.  Ultimately, we discussed admission to the hospital versus going home with outpatient follow-up.  Ultimately through shared decision making the patient be discharged.  Her care manager was able to arrange for a caregiver overnight and the patient normally has caregivers during the day.  Think this is reasonable plan.  She is discharged with return precautions.       Durwin Glaze, MD 03/23/23 2019

## 2023-04-01 ENCOUNTER — Ambulatory Visit: Admitting: Family

## 2023-04-01 DIAGNOSIS — G8929 Other chronic pain: Secondary | ICD-10-CM

## 2023-04-01 DIAGNOSIS — M79675 Pain in left toe(s): Secondary | ICD-10-CM

## 2023-04-01 DIAGNOSIS — M205X2 Other deformities of toe(s) (acquired), left foot: Secondary | ICD-10-CM | POA: Diagnosis not present

## 2023-04-02 ENCOUNTER — Encounter: Payer: Self-pay | Admitting: Family

## 2023-04-02 NOTE — Progress Notes (Signed)
 Office Visit Note   Patient: Kristen Pham           Date of Birth: 26-Oct-1924           MRN: [id number] Visit Date: 04/01/2023              Requested by: Lind Covert, MD [address] [place],  [place] [address] PCP: Lind Covert, MD  Chief Complaint  Patient presents with   Right Foot - Follow-up      HPI: The patient is a 89 year old female who is well-known to our office today for evaluation of chronic ulceration in the second webspace ulcer to the third toe over the IP joint.  Currently resides at [facility] ambulates with a rolling walker  Concern for some darkened discoloration and buildup of thick tissue around the ulcerative area.  Increased pain.  Assessment & Plan: Visit Diagnoses: No diagnosis found.  Plan: Debrided the ulcerative area.  Patient voiced some relief of the pain.  Discussed conservative measures ongoing treatment with ongoing chronic stable osteomyelitis of the toe.  Will webspace and follow-up in the office as needed  Follow-Up Instructions: Return if symptoms worsen or fail to improve.   Ortho Exam  Patient is alert, oriented, no adenopathy, well-dressed, normal affect, normal respiratory effort. On examination right foot in the second webspace there is ulcer over the IP joint of the third toe this is healed today there is some buildup of hyperkeratotic tissue which is likely causing pressure and pain with weightbearing this is debrided today there is slight darkened discoloration to the toe which is chronic there is no erythema no edema no warmth  Imaging: No results found. No images are attached to the encounter.  Labs: Lab Results  Component Value Date   HGBA1C 5.9 (H) 05/15/2014   ESRSEDRATE 83 (H) 12/23/2022   ESRSEDRATE 61 (H) 12/02/2009   CRP 11.9 (H) 12/02/2009   LABURIC 6.4 12/23/2022     Lab Results  Component Value Date   ALBUMIN 3.2 (L) 03/23/2023   ALBUMIN 3.0 (L) 01/02/2023   ALBUMIN 2.8 (L) 12/23/2022     Lab Results  Component Value Date   MG 2.2 12/21/2022   MG 1.9 12/20/2022   No results found for: "VD25OH"  No results found for: "PREALBUMIN"    Latest Ref Rng & Units 03/23/2023    2:43 PM 01/02/2023    5:51 AM 12/23/2022   10:20 AM  CBC EXTENDED  WBC 4.0 - 10.5 K/uL 5.6  8.8  13.5   RBC 3.87 - 5.11 MIL/uL 3.81  4.21  4.07   Hemoglobin 12.0 - 15.0 g/dL 08.6  57.8  46.9   HCT 36.0 - 46.0 % 36.2  38.9  36.2   Platelets 150 - 400 K/uL 242  483  276   NEUT# 1.7 - 7.7 K/uL 3.3  6.4    Lymph# 0.7 - 4.0 K/uL 1.4  1.5       There is no height or weight on file to calculate BMI.  Orders:  No orders of the defined types were placed in this encounter.  No orders of the defined types were placed in this encounter.    Procedures: No procedures performed  Clinical Data: No additional findings.  ROS:  All other systems negative, except as noted in the HPI. Review of Systems  Objective: Vital Signs: There were no vitals taken for this visit.  Specialty Comments:  No specialty comments available.  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 limited to breakdown of skin (HCC) 01/05/2017   Acquired claw toe, left 10/07/2016   Hard corn 10/07/2016   Chronic toe pain, left foot 08/25/2016   Meniscal cyst 08/23/2012   Knee pain, bilateral 06/21/2012   Primary osteoarthritis of both knees 06/21/2012   Generalized osteoarthritis of hand 06/21/2012   Past Medical History:   Diagnosis Date   Anemia    Carpal tunnel syndrome    CHF (congestive heart failure) (HCC)    Depression    Hypertension    Osteoarthritis    PVD (peripheral vascular disease) (HCC)     Family History  Problem Relation Age of Onset   Cerebral aneurysm Father     Past Surgical History:  Procedure Laterality Date   ABDOMINAL HYSTERECTOMY     CESAREAN SECTION     HAND SURGERY     bilateral release    TONSILECTOMY, ADENOIDECTOMY, BILATERAL MYRINGOTOMY AND TUBES     Social History   Occupational History   Occupation: retired   Tobacco Use   Smoking status: Never   Smokeless tobacco: Never  Vaping Use   Vaping status: Never Used  Substance and Sexual Activity   Alcohol use: Yes    Alcohol/week: 0.0 standard drinks of alcohol    Comment: occasionally    Drug use: No   Sexual activity: Not on file

## 2023-04-06 ENCOUNTER — Telehealth: Payer: Self-pay | Admitting: Family

## 2023-04-06 NOTE — Telephone Encounter (Signed)
 Doug from [facility] called requesting a call back concerning changes to wound care orders. Please call Doug on secure line at [phone].

## 2023-04-07 NOTE — Telephone Encounter (Signed)
 You saw this pt last week. Were there any wound care orders for her?

## 2023-04-15 ENCOUNTER — Encounter: Payer: PPO | Attending: Physical Medicine & Rehabilitation | Admitting: Physical Medicine & Rehabilitation

## 2023-04-15 DIAGNOSIS — G8929 Other chronic pain: Secondary | ICD-10-CM | POA: Insufficient documentation

## 2023-04-15 DIAGNOSIS — M25562 Pain in left knee: Secondary | ICD-10-CM | POA: Insufficient documentation

## 2023-04-15 DIAGNOSIS — M25561 Pain in right knee: Secondary | ICD-10-CM | POA: Insufficient documentation

## 2023-04-15 DIAGNOSIS — Z79891 Long term (current) use of opiate analgesic: Secondary | ICD-10-CM | POA: Insufficient documentation

## 2023-04-15 DIAGNOSIS — G894 Chronic pain syndrome: Secondary | ICD-10-CM | POA: Insufficient documentation

## 2023-04-20 ENCOUNTER — Encounter: Payer: Self-pay | Admitting: Cardiovascular Disease

## 2023-04-20 ENCOUNTER — Ambulatory Visit: Payer: PPO | Attending: Cardiovascular Disease | Admitting: Cardiovascular Disease

## 2023-04-20 VITALS — BP 114/70 | HR 59 | Ht 61.0 in | Wt 102.4 lb

## 2023-04-20 DIAGNOSIS — I10 Essential (primary) hypertension: Secondary | ICD-10-CM | POA: Diagnosis not present

## 2023-04-20 DIAGNOSIS — I359 Nonrheumatic aortic valve disorder, unspecified: Secondary | ICD-10-CM | POA: Diagnosis not present

## 2023-04-20 DIAGNOSIS — E785 Hyperlipidemia, unspecified: Secondary | ICD-10-CM

## 2023-04-20 DIAGNOSIS — I739 Peripheral vascular disease, unspecified: Secondary | ICD-10-CM | POA: Diagnosis not present

## 2023-04-20 MED ORDER — CARVEDILOL 6.25 MG PO TABS: 6.2500 mg | ORAL_TABLET | Freq: Two times a day (BID) | ORAL | 1 refills | Status: AC

## 2023-04-20 MED ORDER — HYDRALAZINE HCL 25 MG PO TABS: 25.0000 mg | ORAL_TABLET | Freq: Three times a day (TID) | ORAL | 0 refills | Status: DC | PRN

## 2023-04-20 NOTE — Patient Instructions (Signed)
 Medication Instructions:  Your physician recommends the following medication changes.  STOP TAKING: Clonidine Spironolactone Metoprolol  START TAKING: Carvedilol 6.25 mg twice daily Hydralazine 25 mg three times daily as needed for a systolic (top number) blood pressure over 200.    *If you need a refill on your cardiac medications before your next appointment, please call your pharmacy*  Lab Work: None ordered If you have labs (blood work) drawn today and your tests are completely normal, you will receive your results only by: MyChart Message (if you have MyChart) OR A paper copy in the mail If you have any lab test that is abnormal or we need to change your treatment, we will call you to review the results.  Testing/Procedures: None ordered  Follow-Up: At [facility], you and your health needs are our priority.  As part of our continuing mission to provide you with exceptional heart care, our providers are all part of one team.  This team includes your primary Cardiologist (physician) and Advanced Practice Providers or APPs (Physician Assistants and Nurse Practitioners) who all work together to provide you with the care you need, when you need it.  Your next appointment:   3 month(s)  Provider:   Lorine Bears, MD     We recommend signing up for the patient portal called "MyChart".  Sign up information is provided on this After Visit Summary.  MyChart is used to connect with patients for Virtual Visits (Telemedicine).  Patients are able to view lab/test results, encounter notes, upcoming appointments, etc.  Non-urgent messages can be sent to your provider as well.   To learn more about what you can do with MyChart, go to [email or web address].      1st Floor: - Lobby - Registration  - Pharmacy  - Lab - Cafe  2nd Floor: - PV Lab - Diagnostic Testing (echo, CT, nuclear med)  3rd Floor: - Vacant  4th Floor: - TCTS (cardiothoracic surgery) - AFib  Clinic - [facility] Heart Clinic - Vascular Surgery  - Vascular Ultrasound  5th Floor: - HeartCare Cardiology (general and EP) - Clinical Pharmacy for coumadin, hypertension, lipid, weight-loss medications, and med management appointments    Valet parking services will be available as well.

## 2023-04-20 NOTE — Progress Notes (Signed)
 Cardiology Office Note   Date:  04/20/2023   ID:  Kristen Pham, DOB Aug 30, 1924, MRN [id number]  PCP:  Lind Covert, MD  Cardiologist:   Lorine Bears, MD   No chief complaint on file.     History of Present Illness: Kristen Pham is a 89 y.o. female who is here for a follow-up visit regarding chronic diastolic heart failure,  aortic stenosis and peripheral arterial disease. She has known history of essential hypertension, hyperlipidemia and arthritis.  She is not a smoker and does not have diabetes. She was seen by me in 2017 for leg pain with concerns for peripheral arterial disease. She underwent noninvasive vascular studies in 2017 which showed noncompressible vessels with moderate SFA disease bilaterally.  There was no evidence of obstructive disease at that time. She underwent cardiac evaluation in 2022 due to worsening exertional dyspnea.  Echocardiogram was done in March 2022 and showed  normal LV systolic function with mild aortic stenosis with mean gradient of 11 mmHg.  Lexiscan Myoview showed no evidence of ischemia with normal ejection fraction.  She had repeat Doppler studies in September 2023 which showed an ABI of 0.69 on the right and 0.77 on the left.  She was hospitalized in November with heart failure.  She was hypoxic and tachypneic.  BNP was mildly elevated and her blood pressure was extremely high.  Echocardiogram showed normal LV systolic function, moderate mitral regurgitation and mild aortic stenosis. She improved with IV diuresis.  She was switched from amlodipine and atenolol to irbesartan, spironolactone and Toprol. She continued to have intermittent episodes of elevated blood pressure and had 2 emergency room visits for high blood pressure.  She was prescribed clonidine to be used as needed. However, every time she takes clonidine, she developed severe hypotension and she had 2 emergency room visits for presyncope.  She was also mildly volume  depleted.  She is usually not very symptomatic when her blood pressure is elevated.   Past Medical History:  Diagnosis Date   Anemia    Carpal tunnel syndrome    CHF (congestive heart failure) (HCC)    Depression    Hypertension    Osteoarthritis    PVD (peripheral vascular disease) (HCC)     Past Surgical History:  Procedure Laterality Date   ABDOMINAL HYSTERECTOMY     CESAREAN SECTION     HAND SURGERY     bilateral release    TONSILECTOMY, ADENOIDECTOMY, BILATERAL MYRINGOTOMY AND TUBES       Current Outpatient Medications  Medication Sig Dispense Refill   acetaminophen (TYLENOL) 650 MG CR tablet Take 650 mg by mouth at bedtime.     atorvastatin (LIPITOR) 20 MG tablet Take 1 tablet (20 mg total) by mouth daily. (Patient taking differently: Take 20 mg by mouth every evening.) 90 tablet 3   CALCIUM CITRATE PO Take 1 tablet by mouth daily.     carvedilol (COREG) 6.25 MG tablet Take 1 tablet (6.25 mg total) by mouth 2 (two) times daily. 180 tablet 1   diclofenac Sodium (VOLTAREN) 1 % GEL Apply 2 g topically 4 (four) times daily. Apply to right shoulder. (Patient taking differently: Apply 2 g topically daily as needed (pain). Apply to right shoulder.) 50 g 0   furosemide (LASIX) 20 MG tablet Take 1 tablet (20 mg total) by mouth daily as needed for fluid or edema. 30 tablet 0   gabapentin (NEURONTIN) 100 MG capsule Take 1 capsule (100 mg total) by mouth 2 (two) times  daily. (Patient taking differently: Take 100 mg by mouth See admin instructions. Take 1 capsule (100mg ) by mouth twice daily. May take 1 additional capsule daily as needed for neuropathy.)     hydrALAZINE (APRESOLINE) 25 MG tablet Take 1 tablet (25 mg total) by mouth 3 (three) times daily as needed (As needed for a systolic over 200.). 90 tablet 0   ibuprofen (ADVIL) 200 MG tablet Take 200 mg by mouth daily.     irbesartan (AVAPRO) 150 MG tablet Take 1 tablet (150 mg total) by mouth 2 (two) times daily. 180 tablet 1    mirtazapine (REMERON) 15 MG tablet Take 15 mg by mouth at bedtime.     naproxen sodium (ALEVE) 220 MG tablet Take 220 mg by mouth daily as needed (pain).     Current Facility-Administered Medications  Medication Dose Route Frequency Provider Last Rate Last Admin   triamcinolone acetonide (KENALOG) 10 MG/ML injection 10 mg  10 mg Other Once Asencion Islam, DPM        Allergies:   Ivp dye [iodinated contrast media]    Social History:  The patient  reports that she has never smoked. She has never used smokeless tobacco. She reports current alcohol use. She reports that she does not use drugs.   Family History:  The patient's family history includes Cerebral aneurysm in her father.    ROS:  Please see the history of present illness.   Otherwise, review of systems are positive for none.   All other systems are reviewed and negative.    PHYSICAL EXAM: VS:  BP 114/70 (BP Location: Right Arm, Patient Position: Sitting, Cuff Size: Normal)   Pulse (!) 59   Ht 5\' 1"  (1.549 m)   Wt 102 lb 6.4 oz (46.4 kg)   SpO2 97%   BMI 19.35 kg/m  , BMI Body mass index is 19.35 kg/m. GEN: Well nourished, well developed, in no acute distress  HEENT: normal  Neck: no JVD, carotid bruits, or masses Cardiac: RRR; no rubs, or gallops,no edema .2/ 6 systolic ejection murmur in the aortic area which is mid peaking. Respiratory:  clear to auscultation bilaterally, normal work of breathing GI: soft, nontender, nondistended, + BS. No abdominal bruit MS: no deformity or atrophy  Skin: warm and dry, no rash Neuro:  Strength and sensation are intact Psych: euthymic mood, full affect Vascular: Distal pulses are not palpable.  EKG:  EKG is ordered today. EKG showed :  Sinus bradycardia          Recent Labs: 12/21/2022: Magnesium 2.2 01/02/2023: B Natriuretic Peptide 469.4 03/23/2023: ALT 12; BUN 28; Creatinine, Ser 1.42; Hemoglobin 11.6; Platelets 242; Potassium 4.2; Sodium 140; TSH 1.157    Lipid  Panel No results found for: "CHOL", "TRIG", "HDL", "CHOLHDL", "VLDL", "LDLCALC", "LDLDIRECT"    Wt Readings from Last 3 Encounters:  04/20/23 102 lb 6.4 oz (46.4 kg)  03/23/23 100 lb (45.4 kg)  01/26/23 96 lb (43.5 kg)           No data to display            ASSESSMENT AND PLAN:  1.  Peripheral arterial disease: She has moderately reduced ABI bilaterally with abnormal distal pulses.  Currently with no claudication and no open ulceration.  Continue medical therapy  2.  Aortic stenosis: This was mild on most recent echocardiogram.  3.  Essential hypertension: Very labile blood pressure and she seems to be very sensitive to clonidine which is not a good option  at her age group.  She is also having frequent urination with spironolactone and recent labs showed mild volume depletion. I elected to stop clonidine, spironolactone and Toprol.  I switched her instead to Coreg 6.25 mg twice daily and added hydralazine 25 mg to be used 3 times daily as needed if systolic blood pressure is above 200.  Continue irbesartan at the current dose of 150 mg twice daily.  4.  Hyperlipidemia: Continue treatment with atorvastatin 20 mg daily.   Disposition:   FU with me in 3 months.  Signed,  Lorine Bears, MD  04/20/2023 1:05 PM    [facility]

## 2023-05-04 ENCOUNTER — Emergency Department (HOSPITAL_COMMUNITY)
Admission: EM | Admit: 2023-05-04 | Discharge: 2023-05-04 | Disposition: A | Discharge status: Home / Self Care | Attending: Emergency Medicine | Admitting: Emergency Medicine

## 2023-05-04 ENCOUNTER — Other Ambulatory Visit: Payer: Self-pay

## 2023-05-04 ENCOUNTER — Encounter (HOSPITAL_COMMUNITY): Payer: Self-pay | Admitting: Emergency Medicine

## 2023-05-04 ENCOUNTER — Emergency Department (HOSPITAL_COMMUNITY)

## 2023-05-04 DIAGNOSIS — S2242XA Multiple fractures of ribs, left side, initial encounter for closed fracture: Secondary | ICD-10-CM | POA: Diagnosis not present

## 2023-05-04 DIAGNOSIS — W01190A Fall on same level from slipping, tripping and stumbling with subsequent striking against furniture, initial encounter: Secondary | ICD-10-CM | POA: Diagnosis not present

## 2023-05-04 DIAGNOSIS — I509 Heart failure, unspecified: Secondary | ICD-10-CM | POA: Insufficient documentation

## 2023-05-04 DIAGNOSIS — W19XXXA Unspecified fall, initial encounter: Secondary | ICD-10-CM

## 2023-05-04 DIAGNOSIS — S299XXA Unspecified injury of thorax, initial encounter: Secondary | ICD-10-CM | POA: Diagnosis present

## 2023-05-04 DIAGNOSIS — R1012 Left upper quadrant pain: Secondary | ICD-10-CM | POA: Diagnosis not present

## 2023-05-04 DIAGNOSIS — M542 Cervicalgia: Secondary | ICD-10-CM | POA: Insufficient documentation

## 2023-05-04 DIAGNOSIS — S0101XA Laceration without foreign body of scalp, initial encounter: Secondary | ICD-10-CM | POA: Insufficient documentation

## 2023-05-04 LAB — BASIC METABOLIC PANEL WITH GFR
Anion gap: 9 (ref 5–15)
BUN: 26 mg/dL — ABNORMAL HIGH (ref 8–23)
CO2: 26 mmol/L (ref 22–32)
Calcium: 8.5 mg/dL — ABNORMAL LOW (ref 8.9–10.3)
Chloride: 109 mmol/L (ref 98–111)
Creatinine, Ser: 1.1 mg/dL — ABNORMAL HIGH (ref 0.44–1.00)
GFR, Estimated: 46 mL/min — ABNORMAL LOW (ref >60)
Glucose, Bld: 136 mg/dL — ABNORMAL HIGH (ref 70–99)
Potassium: 4.6 mmol/L (ref 3.5–5.1)
Sodium: 144 mmol/L (ref 135–145)

## 2023-05-04 LAB — CBC WITH DIFFERENTIAL/PLATELET
Abs Immature Granulocytes: 0.04 10*3/uL (ref 0.00–0.07)
Basophils Absolute: 0.1 10*3/uL (ref 0.0–0.1)
Basophils Relative: 1 %
Eosinophils Absolute: 0.5 10*3/uL (ref 0.0–0.5)
Eosinophils Relative: 4 %
HCT: 32 % — ABNORMAL LOW (ref 36.0–46.0)
Hemoglobin: 10.2 g/dL — ABNORMAL LOW (ref 12.0–15.0)
Immature Granulocytes: 0 %
Lymphocytes Relative: 8 %
Lymphs Abs: 0.9 10*3/uL (ref 0.7–4.0)
MCH: 30.7 pg (ref 26.0–34.0)
MCHC: 31.9 g/dL (ref 30.0–36.0)
MCV: 96.4 fL (ref 80.0–100.0)
Monocytes Absolute: 0.6 10*3/uL (ref 0.1–1.0)
Monocytes Relative: 5 %
Neutro Abs: 9.4 10*3/uL — ABNORMAL HIGH (ref 1.7–7.7)
Neutrophils Relative %: 82 %
Platelets: 226 10*3/uL (ref 150–400)
RBC: 3.32 MIL/uL — ABNORMAL LOW (ref 3.87–5.11)
RDW: 13.2 % (ref 11.5–15.5)
WBC: 11.4 10*3/uL — ABNORMAL HIGH (ref 4.0–10.5)
nRBC: 0 % (ref 0.0–0.2)

## 2023-05-04 MED ORDER — LIDOCAINE-EPINEPHRINE-TETRACAINE (LET) TOPICAL GEL
3.0000 mL | Freq: Once | TOPICAL | Status: AC
  Administered 2023-05-04: 3 mL via TOPICAL
  Filled 2023-05-04: qty 3

## 2023-05-04 NOTE — ED Notes (Signed)
 Unable to obtain ua at this time. Pt advised that one is needed will continue to ask for one.

## 2023-05-04 NOTE — ED Notes (Signed)
 Cleaned patient's head.  Provider notified.

## 2023-05-04 NOTE — ED Notes (Signed)
 Pt given incentive spirometer and given instructions with return demonstration.  Daughter also received instructions.

## 2023-05-04 NOTE — ED Triage Notes (Signed)
 Pt arrives via EMS from [place] with a mechanical fall from standing and hit back of head on dresser. Denies LOC or blood thinners.  CBG 176

## 2023-05-04 NOTE — ED Notes (Signed)
 Patient transported to X-ray

## 2023-05-04 NOTE — Discharge Instructions (Signed)
 Your evaluated this evening after a fall.  You do have fractures of the 10th and 11th rib.  This is on the left side.  Use the incentive spirometer at home hourly throughout the day to help keep your lungs open.  If you develop shortness of breath or other emergent symptoms please return to the emergency department.  1 staple was placed in your scalp due to a head wound which should be removed in approximately 7 days.  Please follow-up as needed with your primary care provider.

## 2023-05-04 NOTE — ED Provider Notes (Signed)
 [place] [facility]ERG[facility] DEPARTMENT AT [facility] Provider Note   CSN: [id number] Arrival date & time: 05/04/23  1547     History  Chief Complaint  Patient presents with   Kristen Pham is a 89 y.o. female.  Patient with past medical history significant for neuralgia, osteoarthritis of both knees, peripheral vascular disease, chronic CHF presents to the emergency department via EMS due to an unwitnessed fall.  Patient from [employer].  Patient with possible mechanical fall, falling backwards hitting the back of her head on a dresser.  Head is wrapped upon arrival and c-collar is in place.  Patient complains of head pain, neck pain, and left-sided flank pain.  She denies shortness of breath, chest pain, nausea, vomiting, vision changes, loss of consciousness.  The patient does not take blood thinners.   Fall       Home Medications Prior to Admission medications   Medication Sig Start Date End Date Taking? Authorizing Provider  acetaminophen (TYLENOL) 650 MG CR tablet Take 650 mg by mouth at bedtime.    [provider]  atorvastatin (LIPITOR) 20 MG tablet Take 1 tablet (20 mg total) by mouth daily. Patient taking differently: Take 20 mg by mouth every evening. 05/19/22 05/29/23  Iran Ouch, MD  CALCIUM CITRATE PO Take 1 tablet by mouth daily.    [provider]  carvedilol (COREG) 6.25 MG tablet Take 1 tablet (6.25 mg total) by mouth 2 (two) times daily. 04/20/23   Iran Ouch, MD  diclofenac Sodium (VOLTAREN) 1 % GEL Apply 2 g topically 4 (four) times daily. Apply to right shoulder. Patient taking differently: Apply 2 g topically daily as needed (pain). Apply to right shoulder. 12/22/22   Arrien, York Ram, MD  furosemide (LASIX) 20 MG tablet Take 1 tablet (20 mg total) by mouth daily as needed for fluid or edema. 12/22/22   Arrien, York Ram, MD  gabapentin (NEURONTIN) 100 MG capsule Take 1 capsule (100 mg total) by mouth 2  (two) times daily. Patient taking differently: Take 100 mg by mouth See admin instructions. Take 1 capsule (100mg ) by mouth twice daily. May take 1 additional capsule daily as needed for neuropathy. 12/22/22   Arrien, York Ram, MD  hydrALAZINE (APRESOLINE) 25 MG tablet Take 1 tablet (25 mg total) by mouth 3 (three) times daily as needed (As needed for a systolic over 200.). 04/20/23 07/19/23  Iran Ouch, MD  ibuprofen (ADVIL) 200 MG tablet Take 200 mg by mouth daily.    [provider]  irbesartan (AVAPRO) 150 MG tablet Take 1 tablet (150 mg total) by mouth 2 (two) times daily. 01/26/23   Iran Ouch, MD  mirtazapine (REMERON) 15 MG tablet Take 15 mg by mouth at bedtime.    [provider]  naproxen sodium (ALEVE) 220 MG tablet Take 220 mg by mouth daily as needed (pain).    [provider]      Allergies    Ivp dye [iodinated contrast media]    Review of Systems   Review of Systems  Physical Exam Updated Vital Signs BP (!) 128/57   Pulse 85   Temp 98 F (36.7 C) (Oral)   Resp 14   Ht 5\' 1"  (1.549 m)   Wt 46 kg   SpO2 100%   BMI 19.16 kg/m  Physical Exam Vitals and nursing note reviewed.  Constitutional:      General: She is not in acute distress.  Appearance: She is well-developed.  HENT:     Head: Normocephalic and atraumatic.  Eyes:     Conjunctiva/sclera: Conjunctivae normal.  Neck:     Comments: Initially in C-collar Cardiovascular:     Rate and Rhythm: Normal rate and regular rhythm.  Pulmonary:     Effort: Pulmonary effort is normal. No respiratory distress.     Breath sounds: Normal breath sounds.  Abdominal:     Palpations: Abdomen is soft.     Tenderness: There is abdominal tenderness.     Comments: Developed tenderness to LUQ  Musculoskeletal:        General: Tenderness present. No swelling.     Cervical back: Normal range of motion and neck supple. No tenderness.     Comments: Tenderness to palpation of left  flank  Skin:    General: Skin is warm and dry.     Capillary Refill: Capillary refill takes less than 2 seconds.  Neurological:     Mental Status: She is alert. Mental status is at baseline.  Psychiatric:        Mood and Affect: Mood normal.     ED Results / Procedures / Treatments   Labs (all labs ordered are listed, but only abnormal results are displayed) Labs Reviewed  BASIC METABOLIC PANEL WITH GFR - Abnormal; Notable for the following components:      Result Value   Glucose, Bld 136 (*)    BUN 26 (*)    Creatinine, Ser 1.10 (*)    Calcium 8.5 (*)    GFR, Estimated 46 (*)    All other components within normal limits  CBC WITH DIFFERENTIAL/PLATELET - Abnormal; Notable for the following components:   WBC 11.4 (*)    RBC 3.32 (*)    Hemoglobin 10.2 (*)    HCT 32.0 (*)    Neutro Abs 9.4 (*)    All other components within normal limits  URINALYSIS, ROUTINE W REFLEX MICROSCOPIC    EKG EKG Interpretation Date/Time:  Tuesday May 04 2023 16:03:11 EDT Ventricular Rate:  72 PR Interval:  52 QRS Duration:  80 QT Interval:  412 QTC Calculation: 451 R Axis:   47  Text Interpretation: Sinus rhythm Atrial premature complex Short PR interval no significant change since April 20 2023 Confirmed by Jerilynn Montenegro [phone]) on 05/04/2023 4:13:50 PM  Radiology CT CHEST ABDOMEN PELVIS WO CONTRAST Result Date: 05/04/2023 CLINICAL DATA:  Polytrauma, blunt.  Fall. EXAM: CT CHEST, ABDOMEN AND PELVIS WITHOUT CONTRAST TECHNIQUE: Multidetector CT imaging of the chest, abdomen and pelvis was performed following the standard protocol without IV contrast. RADIATION DOSE REDUCTION: This exam was performed according to the departmental dose-optimization program which includes automated exposure control, adjustment of the mA and/or kV according to patient size and/or use of iterative reconstruction technique. COMPARISON:  None Available. FINDINGS: CT CHEST FINDINGS Cardiovascular: Heart is normal  size. Aorta is normal caliber. Coronary artery and aortic atherosclerosis. Mediastinum/Nodes: No mediastinal, hilar, or axillary adenopathy. Trachea and esophagus are unremarkable. Left thyroid nodule measuring 2.7 cm. Lungs/Pleura: Lungs are clear. No focal airspace opacities or suspicious nodules. No effusions. No pneumothorax. Musculoskeletal: Left posterior 10th and 11th rib fractures. No additional acute bony abnormality. Chest wall soft tissues are unremarkable. CT ABDOMEN PELVIS FINDINGS Hepatobiliary: No hepatic injury or perihepatic hematoma. Gallbladder is distended with numerous layering gallstones. Common bile duct dilated at 11 mm. No focal hepatic abnormality. Pancreas: No focal abnormality or ductal dilatation. Spleen: No splenic injury or perisplenic hematoma. Adrenals/Urinary Tract: No  adrenal hemorrhage or renal injury identified. Bladder is unremarkable. Stomach/Bowel: Moderate stool burden throughout the colon. Sigmoid diverticulosis. No active diverticulitis. Stomach and small bowel decompressed, unremarkable. Vascular/Lymphatic: Aortoiliac atherosclerosis. No evidence of aneurysm or adenopathy. Reproductive: Prior hysterectomy.  No adnexal masses. Other: No free fluid or free air. Musculoskeletal: No acute bony abnormality. IMPRESSION: Fractures through the posterior left 10th and 11th ribs. No associated effusion or pneumothorax. No evidence of significant trauma in the abdomen or pelvis. Cholelithiasis. Gallbladder is moderately distended. Common bile duct is dilated, possibly related to patient's age. No visible ductal stones. Moderate stool burden throughout the colon.  Sigmoid diverticulosis. Aortoiliac atherosclerosis. Electronically Signed   By: Charlett Nose M.D.   On: 05/04/2023 22:19   CT L-SPINE NO CHARGE Result Date: 05/04/2023 CLINICAL DATA:  Fall, trauma. EXAM: CT LUMBAR SPINE WITHOUT CONTRAST TECHNIQUE: Multidetector CT imaging of the lumbar spine was performed without  intravenous contrast administration. Multiplanar CT image reconstructions were also generated. RADIATION DOSE REDUCTION: This exam was performed according to the departmental dose-optimization program which includes automated exposure control, adjustment of the mA and/or kV according to patient size and/or use of iterative reconstruction technique. COMPARISON:  None Available. FINDINGS: Segmentation: 5 lumbar type vertebrae. Alignment: There is 2 mm of anterolisthesis at L5-S1. Alignment is otherwise anatomic. Vertebrae: There is no acute fracture or dislocation. There is moderate to severe disc space narrowing and endplate osteophyte formation throughout the lumbar spine compatible with degenerative change. Vacuum disc phenomena is noted at multiple levels. There also degenerative changes of the bilateral sacroiliac joints. Acute posterior left tenth and eleventh rib fractures are present. Paraspinal and other soft tissues: There are tubal soft tissues are within normal limits. There are atherosclerotic calcifications of the aorta. Multiple gallstones are present. The gallbladder appears distended. Bladder diverticulum partially imaged. There is sigmoid colon diverticulosis. Disc levels: T12-L1: Posterior disc osteophyte complex with bilateral facet arthropathy. Moderate central canal stenosis. Mild right neural foraminal stenosis. L1-L2: Posterior disc osteophyte complex with bilateral facet arthropathy. Moderate central canal stenosis. Severe left and moderate right neural foraminal stenosis. L2-L3: Posterior disc osteophyte complex with bilateral facet arthropathy. Moderate central canal stenosis. Moderate left neural foraminal stenosis. L3-L4: Posterior disc osteophyte complex with thickening of the ligamentum flavum and bilateral facet arthropathy. Severe central canal stenosis. Severe bilateral neural foraminal stenosis, right greater than left. L4-L5: Posterior disc osteophyte complex eccentric to the right  with bilateral facet arthropathy. Severe central canal stenosis. Mild left and moderate severe right neural foraminal stenosis. L5-S1: Posterior disc osteophyte complex with bilateral facet arthropathy. Mild right neural foraminal stenosis. IMPRESSION: 1. No acute fracture or dislocation of the lumbar spine. 2. Acute posterior left tenth and eleventh rib fractures. 3. Multilevel degenerative changes of the lumbar spine with severe central canal stenosis at L3-L4 and L4-L5. 4. Cholelithiasis with distended gallbladder. Correlate clinically for acute cholecystitis. 5. Aortic atherosclerosis. Electronically Signed   By: Darliss Cheney M.D.   On: 05/04/2023 20:51   DG Pelvis 1-2 Views Result Date: 05/04/2023 CLINICAL DATA:  Fall. EXAM: PELVIS - 1-2 VIEW COMPARISON:  None Available. FINDINGS: Pelvis is intact with normal and symmetric sacroiliac joints. No acute fracture or dislocation. No aggressive osseous lesion. Visualized sacral arcuate lines are unremarkable. Unremarkable symphysis pubis. There are mild degenerative changes of bilateral hip joints without significant joint space narrowing. Osteophytosis of the superior acetabulum. No radiopaque foreign bodies. IMPRESSION: *No acute osseous abnormality of the pelvis. Electronically Signed   By: Timoteo Expose.D.  On: 05/04/2023 17:14   CT Head Wo Contrast Result Date: 05/04/2023 CLINICAL DATA:  Head trauma, minor (Age >= 65y); Neck trauma (Age >= 65y). Fall. EXAM: CT HEAD WITHOUT CONTRAST CT CERVICAL SPINE WITHOUT CONTRAST TECHNIQUE: Multidetector CT imaging of the head and cervical spine was performed following the standard protocol without intravenous contrast. Multiplanar CT image reconstructions of the cervical spine were also generated. RADIATION DOSE REDUCTION: This exam was performed according to the departmental dose-optimization program which includes automated exposure control, adjustment of the mA and/or kV according to patient size and/or use of  iterative reconstruction technique. COMPARISON:  CT scan head and cervical spine from 03/06/2022. FINDINGS: CT HEAD FINDINGS Brain: No evidence of acute infarction, hemorrhage, hydrocephalus, extra-axial collection or mass lesion/mass effect. There is bilateral periventricular hypodensity, which is non-specific but most likely seen in the settings of microvascular ischemic changes. Moderate in extent. Otherwise normal appearance of brain parenchyma. Ventricles are prominent but cerebral volume is age appropriate. Vascular: No hyperdense vessel or unexpected calcification. Intracranial arteriosclerosis. Skull: Normal. Negative for fracture or focal lesion. Sinuses/Orbits: No acute finding. Moderate to severe mucoperiosteal thickening noted in the right maxillary sinus which also contains hyperattenuating areas, which may represent inspissated material versus fungal colonization. No significant interval change. No air-fluid levels to suggest acute sinusitis. Mild mucoperiosteal thickening also noted in the left maxillary sinus. Other: Extracranial scalp hematoma/swelling noted over the left parietal lobe. No underlying fracture. Visualized mastoid air cells are unremarkable. No mastoid effusion. CT CERVICAL SPINE FINDINGS Alignment: There is reversal of cervical lordosis. There is grade 1 anterolisthesis of L2 over L3, L3 over L4 and L4 over L5, most likely degenerative. This examination does not assess for ligamentous injury or stability. Skull base and vertebrae: No acute fracture. No primary bone lesion or focal pathologic process. Soft tissues and spinal canal: No prevertebral fluid or swelling. No visible canal hematoma. Disc levels: There are moderate multilevel degenerative changes characterized by reduced intervertebral disc height, no facet arthropathy and marginal osteophyte formation, most pronounced at C5-C6 and C6-7 levels. Upper chest: Negative. Other: Redemonstration of asymmetrically enlarged and  heterogeneous left thyroid lobe/goiter not well evaluated on the CT scan exam. IMPRESSION: 1. No acute intracranial abnormality. 2. No acute osseous injury or traumatic listhesis of the cervical spine. 3. Multiple other nonacute observations, as described above. Electronically Signed   By: Beula Brunswick M.D.   On: 05/04/2023 17:12   CT Cervical Spine Wo Contrast Result Date: 05/04/2023 CLINICAL DATA:  Head trauma, minor (Age >= 65y); Neck trauma (Age >= 65y). Fall. EXAM: CT HEAD WITHOUT CONTRAST CT CERVICAL SPINE WITHOUT CONTRAST TECHNIQUE: Multidetector CT imaging of the head and cervical spine was performed following the standard protocol without intravenous contrast. Multiplanar CT image reconstructions of the cervical spine were also generated. RADIATION DOSE REDUCTION: This exam was performed according to the departmental dose-optimization program which includes automated exposure control, adjustment of the mA and/or kV according to patient size and/or use of iterative reconstruction technique. COMPARISON:  CT scan head and cervical spine from 03/06/2022. FINDINGS: CT HEAD FINDINGS Brain: No evidence of acute infarction, hemorrhage, hydrocephalus, extra-axial collection or mass lesion/mass effect. There is bilateral periventricular hypodensity, which is non-specific but most likely seen in the settings of microvascular ischemic changes. Moderate in extent. Otherwise normal appearance of brain parenchyma. Ventricles are prominent but cerebral volume is age appropriate. Vascular: No hyperdense vessel or unexpected calcification. Intracranial arteriosclerosis. Skull: Normal. Negative for fracture or focal lesion. Sinuses/Orbits: No acute finding. Moderate  to severe mucoperiosteal thickening noted in the right maxillary sinus which also contains hyperattenuating areas, which may represent inspissated material versus fungal colonization. No significant interval change. No air-fluid levels to suggest acute  sinusitis. Mild mucoperiosteal thickening also noted in the left maxillary sinus. Other: Extracranial scalp hematoma/swelling noted over the left parietal lobe. No underlying fracture. Visualized mastoid air cells are unremarkable. No mastoid effusion. CT CERVICAL SPINE FINDINGS Alignment: There is reversal of cervical lordosis. There is grade 1 anterolisthesis of L2 over L3, L3 over L4 and L4 over L5, most likely degenerative. This examination does not assess for ligamentous injury or stability. Skull base and vertebrae: No acute fracture. No primary bone lesion or focal pathologic process. Soft tissues and spinal canal: No prevertebral fluid or swelling. No visible canal hematoma. Disc levels: There are moderate multilevel degenerative changes characterized by reduced intervertebral disc height, no facet arthropathy and marginal osteophyte formation, most pronounced at C5-C6 and C6-7 levels. Upper chest: Negative. Other: Redemonstration of asymmetrically enlarged and heterogeneous left thyroid lobe/goiter not well evaluated on the CT scan exam. IMPRESSION: 1. No acute intracranial abnormality. 2. No acute osseous injury or traumatic listhesis of the cervical spine. 3. Multiple other nonacute observations, as described above. Electronically Signed   By: Beula Brunswick M.D.   On: 05/04/2023 17:12   DG Chest 2 View Result Date: 05/04/2023 CLINICAL DATA:  Fall.  Left-sided chest pain. EXAM: CHEST - 2 VIEW COMPARISON:  03/23/2023. FINDINGS: Low lung volume. Bilateral lung fields are clear. Bilateral costophrenic angles are clear. Stable cardio-mediastinal silhouette. No acute osseous abnormalities. The soft tissues are within normal limits. IMPRESSION: No active cardiopulmonary disease. Electronically Signed   By: Beula Brunswick M.D.   On: 05/04/2023 17:05    Procedures .Laceration Repair  Date/Time: 05/04/2023 10:59 PM  Performed by: Elisa Guest, PA-C Authorized by: Elisa Guest, PA-C   Consent:     Consent obtained:  Verbal   Consent given by:  Patient   Risks, benefits, and alternatives were discussed: yes     Risks discussed:  Pain, infection, poor cosmetic result, need for additional repair and poor wound healing Universal protocol:    Patient identity confirmed:  Verbally with patient Anesthesia:    Anesthesia method:  Topical application   Topical anesthetic:  LET Laceration details:    Location:  Scalp   Scalp location:  Occipital   Length (cm):  0.5 Exploration:    Hemostasis achieved with:  Epinephrine Treatment:    Area cleansed with:  Shur-Clens and saline   Amount of cleaning:  Extensive Skin repair:    Repair method:  Staples   Number of staples:  1 Approximation:    Approximation:  Close Repair type:    Repair type:  Simple Post-procedure details:    Dressing:  Open (no dressing)   Procedure completion:  Tolerated well, no immediate complications     Medications Ordered in ED Medications  lidocaine-EPINEPHrine-tetracaine (LET) topical gel (3 mLs Topical Given 05/04/23 2126)    ED Course/ Medical Decision Making/ A&P                                 Medical Decision Making Amount and/or Complexity of Data Reviewed Labs: ordered. Radiology: ordered.   This patient presents to the ED for concern of injuries secondary to a fall, this involves an extensive number of treatment options, and is a complaint that carries with it  a high risk of complications and morbidity.  The differential diagnosis includes intracranial abnormality, fracture, dislocation, soft tissue injury, others.   Co morbidities that complicate the patient evaluation  Osteoarthritis, peripheral vascular disease, CHF   Additional history obtained:  Additional history obtained from EMS and patient's daughter External records from outside source obtained and reviewed including cardiology notes   Lab Tests:  I Ordered, and personally interpreted labs.  The pertinent results  include: BMP and CBC grossly at baseline   Imaging Studies ordered:  I ordered imaging studies including CT scans of the head, cervical spine, chest abdomen pelvis without contrast (contrast allergy), plain films of the pelvis and chest I independently visualized and interpreted imaging which showed no acute findings on plain films CT chest abdomen pelvis Fractures through the posterior left 10th and 11th ribs. No  associated effusion or pneumothorax.    No evidence of significant trauma in the abdomen or pelvis.    Cholelithiasis. Gallbladder is moderately distended. Common bile  duct is dilated, possibly related to patient's age. No visible  ductal stones.    Moderate stool burden throughout the colon.  Sigmoid diverticulosis.    Aortoiliac atherosclerosis.   I agree with the radiologist interpretation   Cardiac Monitoring: / EKG:  The patient was maintained on a cardiac monitor.  I personally viewed and interpreted the cardiac monitored which showed an underlying rhythm of: Sinus rhythm   Problem List / ED Course / Critical interventions / Medication management   I ordered medication including LET for topical anesthetic  Reevaluation of the patient after these medicines showed that the patient improved I have reviewed the patients home medicines and have made adjustments as needed   Test / Admission - Considered:  Patient with acute rib fractures of the posterior left-sided 10th and 11th rib.  Patient with mild pain on inspiration but no difficulty with respiration.  Patient provided with incentive spirometer.  Patient and daughter would like to discharge patient home versus keeping her in the hospital as they are concerned about worsening delirium and other complications with hospital stays.  This seems perfectly reasonable.  Patient's daughter endorses that they found the walker away from where the patient was supposed to be which makes her believe this was likely  mechanical in nature.  Patient does believe it was probably mechanical even though she is not the best historian when it comes to how she fell.  Patient has been unable due to incontinence to provide a urine sample.  Family feels comfortable discharging home without urinalysis and this does seem reasonable.  Patient will follow-up with primary care for further evaluation and management as needed.  1 staple was placed in the posterior scalp which will need to be removed in 7 to 10 days.  No indication for further emergent workup or admission at this time.  Patient stable for discharge home.         Final Clinical Impression(s) / ED Diagnoses Final diagnoses:  Fall, initial encounter  Laceration of scalp, initial encounter  Closed fracture of multiple ribs of left side, initial encounter    Rx / DC Orders ED Discharge Orders     None         Pamala Duffel 05/04/23 2302    Pricilla Loveless, MD 05/04/23 2317

## 2023-06-01 ENCOUNTER — Ambulatory Visit (INDEPENDENT_AMBULATORY_CARE_PROVIDER_SITE_OTHER): Admitting: Family

## 2023-06-01 DIAGNOSIS — M79675 Pain in left toe(s): Secondary | ICD-10-CM

## 2023-06-01 DIAGNOSIS — G8929 Other chronic pain: Secondary | ICD-10-CM | POA: Diagnosis not present

## 2023-06-01 DIAGNOSIS — L97521 Non-pressure chronic ulcer of other part of left foot limited to breakdown of skin: Secondary | ICD-10-CM

## 2023-06-01 DIAGNOSIS — M205X2 Other deformities of toe(s) (acquired), left foot: Secondary | ICD-10-CM | POA: Diagnosis not present

## 2023-06-02 ENCOUNTER — Other Ambulatory Visit: Payer: Self-pay | Admitting: Cardiovascular Disease

## 2023-06-02 ENCOUNTER — Encounter: Payer: Self-pay | Admitting: Family

## 2023-06-02 NOTE — Progress Notes (Signed)
 Office Visit Note   Patient: Kristen Pham           Date of Birth: [birth year]           MRN: [id number] Visit Date: 06/01/2023              Requested by: Errol Heaps, MD [address] [place],  [place] [address] PCP: Errol Heaps, MD  Chief Complaint  Patient presents with   Right Foot - Wound Check      HPI: The patient is a 89 year old woman who presents today for reevaluation of ulcer to the second toe of the right foot.  She does have a history of chronic osteomyelitis and has had buildup of a painful hyperkeratotic tissue  They have been using a toe spacer but unfortunately this continued to fall out and was difficult to keep in place daily  Assessment & Plan: Visit Diagnoses: No diagnosis found.  Plan: Hyperkeratotic tissue debrided with a 10 blade knife back to viable tissue patient tolerated well she will continue to closely monitor this and follow-up as needed  Follow-Up Instructions: No follow-ups on file.   Ortho Exam  Patient is alert, oriented, no adenopathy, well-dressed, normal affect, normal respiratory effort. On examination right foot medial ulcer in the second webspace to the second toe has thickened buildup of hyperkeratotic tissue there is no surrounding drainage or maceration this is debrided with a 10 blade knife back to viable tissue there is no underlying open area there is no sausage digit swelling of the toe no odor  Imaging: No results found. No images are attached to the encounter.  Labs: Lab Results  Component Value Date   HGBA1C 5.9 (H) 05/15/2014   ESRSEDRATE 83 (H) 12/23/2022   ESRSEDRATE 61 (H) 12/02/2009   CRP 11.9 (H) 12/02/2009   LABURIC 6.4 12/23/2022     Lab Results  Component Value Date   ALBUMIN 3.2 (L) 03/23/2023   ALBUMIN 3.0 (L) 01/02/2023   ALBUMIN 2.8 (L) 12/23/2022    Lab Results  Component Value Date   MG 2.2 12/21/2022   MG 1.9 12/20/2022   No results found for: "VD25OH"  No results found for:  "PREALBUMIN"    Latest Ref Rng & Units 05/04/2023    4:01 PM 03/23/2023    2:43 PM 01/02/2023    5:51 AM  CBC EXTENDED  WBC 4.0 - 10.5 K/uL 11.4  5.6  8.8   RBC 3.87 - 5.11 MIL/uL 3.32  3.81  4.21   Hemoglobin 12.0 - 15.0 g/dL 57.8  46.9  62.9   HCT 36.0 - 46.0 % 32.0  36.2  38.9   Platelets 150 - 400 K/uL 226  242  483   NEUT# 1.7 - 7.7 K/uL 9.4  3.3  6.4   Lymph# 0.7 - 4.0 K/uL 0.9  1.4  1.5      There is no height or weight on file to calculate BMI.  Orders:  No orders of the defined types were placed in this encounter.  No orders of the defined types were placed in this encounter.    Procedures: No procedures performed  Clinical Data: No additional findings.  ROS:  All other systems negative, except as noted in the HPI. Review of Systems  Objective: Vital Signs: There were no vitals taken for this visit.  Specialty Comments:  No specialty comments available.  PMFS History: Patient Active Problem List   Diagnosis Date Noted   Essential hypertension 12/18/2022   Anxiety  with depression 12/18/2022   Anemia 12/18/2022   Acute on chronic diastolic CHF (congestive heart failure) (HCC) 12/18/2022   Acute respiratory failure (HCC) 12/18/2022   PAD (peripheral artery disease) (HCC) 04/21/2022   Bilateral pseudophakia 02/17/2021   Corneal edema of right eye 02/17/2021   Neuralgia 11/07/2018   Sensorineural hearing loss (SNHL) of both ears 10/12/2018   Non-pressure chronic ulcer of other part of left foot limited to breakdown of skin (HCC) 01/05/2017   Acquired claw toe, left 10/07/2016   Hard corn 10/07/2016   Chronic toe pain, left foot 08/25/2016   Meniscal cyst 08/23/2012   Knee pain, bilateral 06/21/2012   Primary osteoarthritis of both knees 06/21/2012   Generalized osteoarthritis of hand 06/21/2012   Past Medical History:  Diagnosis Date   Anemia    Carpal tunnel syndrome    CHF (congestive heart failure) (HCC)    Depression    Hypertension     Osteoarthritis    PVD (peripheral vascular disease) (HCC)     Family History  Problem Relation Age of Onset   Cerebral aneurysm Father     Past Surgical History:  Procedure Laterality Date   ABDOMINAL HYSTERECTOMY     CESAREAN SECTION     HAND SURGERY     bilateral release    TONSILECTOMY, ADENOIDECTOMY, BILATERAL MYRINGOTOMY AND TUBES     Social History   Occupational History   Occupation: retired   Tobacco Use   Smoking status: Never   Smokeless tobacco: Never  Vaping Use   Vaping status: Never Used  Substance and Sexual Activity   Alcohol use: Yes    Alcohol/week: 0.0 standard drinks of alcohol    Comment: occasionally    Drug use: No   Sexual activity: Not on file

## 2023-06-09 ENCOUNTER — Telehealth: Payer: Self-pay | Admitting: Cardiovascular Disease

## 2023-06-09 NOTE — Telephone Encounter (Signed)
Pt's medication has already been sent to pt's preferred pharmacy. Confirmation received.

## 2023-06-09 NOTE — Telephone Encounter (Signed)
*  STAT* If patient is at the pharmacy, call can be transferred to refill team.   1. Which medications need to be refilled? (please list name of each medication and dose if known) atorvastatin  (LIPITOR) 20 MG tablet   2. Which pharmacy/location (including street and city if local pharmacy) is medication to be sent to? [id number] - [place], [place] - [address]   3. Do they need a 30 day or 90 day supply? 90

## 2023-07-26 ENCOUNTER — Other Ambulatory Visit: Payer: Self-pay | Admitting: Cardiovascular Disease

## 2023-07-27 ENCOUNTER — Ambulatory Visit: Attending: Cardiovascular Disease | Admitting: Cardiovascular Disease

## 2023-07-27 ENCOUNTER — Encounter: Payer: Self-pay | Admitting: Cardiovascular Disease

## 2023-07-27 VITALS — BP 158/80 | HR 70 | Ht 61.0 in | Wt 109.0 lb

## 2023-07-27 DIAGNOSIS — I10 Essential (primary) hypertension: Secondary | ICD-10-CM | POA: Diagnosis not present

## 2023-07-27 DIAGNOSIS — I359 Nonrheumatic aortic valve disorder, unspecified: Secondary | ICD-10-CM | POA: Diagnosis not present

## 2023-07-27 DIAGNOSIS — E785 Hyperlipidemia, unspecified: Secondary | ICD-10-CM | POA: Diagnosis not present

## 2023-07-27 DIAGNOSIS — I739 Peripheral vascular disease, unspecified: Secondary | ICD-10-CM | POA: Diagnosis not present

## 2023-07-27 NOTE — Patient Instructions (Signed)
  Follow-Up: At [facility], you and your health needs are our priority.  As part of our continuing mission to provide you with exceptional heart care, our providers are all part of one team.  This team includes your primary Cardiologist (physician) and Advanced Practice Providers or APPs (Physician Assistants and Nurse Practitioners) who all work together to provide you with the care you need, when you need it.  Your next appointment:   6 month(s)  Provider:   Deatrice Cage, MD

## 2023-07-27 NOTE — Progress Notes (Signed)
 Cardiology Office Note   Date:  07/27/2023   ID:  Kristen Pham, DOB 1924-12-06, MRN [id number]  PCP:  Claudene Lacks, MD  Cardiologist:   Deatrice Cage, MD   No chief complaint on file.     History of Present Illness: Kristen Pham is a 89 y.o. female who is here for a follow-up visit regarding chronic diastolic heart failure,  aortic stenosis and peripheral arterial disease. She has known history of essential hypertension, hyperlipidemia and arthritis.  She is not a smoker and does not have diabetes. She was seen by me in 2017 for leg pain with concerns for peripheral arterial disease. She underwent noninvasive vascular studies in 2017 which showed noncompressible vessels with moderate SFA disease bilaterally.  There was no evidence of obstructive disease at that time. She underwent cardiac evaluation in 2022 due to worsening exertional dyspnea.  Echocardiogram was done in March 2022 and showed  normal LV systolic function with mild aortic stenosis with mean gradient of 11 mmHg.  Lexiscan  Myoview  showed no evidence of ischemia with normal ejection fraction.  She had repeat Doppler studies in September 2023 which showed an ABI of 0.69 on the right and 0.77 on the left.  She was hospitalized in November with heart failure.  She was hypoxic and tachypneic.  BNP was mildly elevated and her blood pressure was extremely high.  Echocardiogram showed normal LV systolic function, moderate mitral regurgitation and mild aortic stenosis. She improved with IV diuresis.  She was switched from amlodipine and atenolol to irbesartan , spironolactone  and Toprol . She continued to have intermittent episodes of elevated blood pressure and had 2 emergency room visits for high blood pressure.  She was prescribed clonidine  to be used as needed. However, clonidine  was associated with severe hypotension and presyncope.  During last visit, I discontinued spironolactone , Toprol  and clonidine  and started  her on carvedilol .  I prescribed hydralazine  to be used as needed if systolic blood pressure is above 200.  Since the change, she had significant improvement in blood pressure control overall.  Her blood pressure is usually high in the morning but decreases after she takes her morning medications.  She has not had to use the as needed hydralazine .  No chest pain or shortness of breath.   Past Medical History:  Diagnosis Date   Anemia    Carpal tunnel syndrome    CHF (congestive heart failure) (HCC)    Depression    Hypertension    Osteoarthritis    PVD (peripheral vascular disease) (HCC)     Past Surgical History:  Procedure Laterality Date   ABDOMINAL HYSTERECTOMY     CESAREAN SECTION     HAND SURGERY     bilateral release    TONSILECTOMY, ADENOIDECTOMY, BILATERAL MYRINGOTOMY AND TUBES       Current Outpatient Medications  Medication Sig Dispense Refill   acetaminophen  (TYLENOL ) 650 MG CR tablet Take 650 mg by mouth at bedtime.     atorvastatin  (LIPITOR) 20 MG tablet TAKE 1 TABLET BY MOUTH EVERY DAY 90 tablet 3   CALCIUM  CITRATE PO Take 1 tablet by mouth daily.     carvedilol  (COREG ) 6.25 MG tablet Take 1 tablet (6.25 mg total) by mouth 2 (two) times daily. 180 tablet 1   diclofenac  Sodium (VOLTAREN ) 1 % GEL Apply 2 g topically 4 (four) times daily. Apply to right shoulder. (Patient taking differently: Apply 2 g topically daily as needed (pain). Apply to right shoulder.) 50 g 0   furosemide  (LASIX )  20 MG tablet Take 1 tablet (20 mg total) by mouth daily as needed for fluid or edema. 30 tablet 0   gabapentin  (NEURONTIN ) 100 MG capsule Take 1 capsule (100 mg total) by mouth 2 (two) times daily. (Patient taking differently: Take 100 mg by mouth See admin instructions. Take 1 capsule (100mg ) by mouth twice daily. May take 1 additional capsule daily as needed for neuropathy.)     hydrALAZINE  (APRESOLINE ) 25 MG tablet Take 1 tablet (25 mg total) by mouth 3 (three) times daily as needed  (As needed for a systolic over 200.). 90 tablet 0   ibuprofen (ADVIL) 200 MG tablet Take 200 mg by mouth daily.     irbesartan  (AVAPRO ) 150 MG tablet Take 1 tablet (150 mg total) by mouth 2 (two) times daily. 180 tablet 1   mirtazapine (REMERON) 15 MG tablet Take 15 mg by mouth at bedtime.     naproxen sodium (ALEVE) 220 MG tablet Take 220 mg by mouth daily as needed (pain).     Current Facility-Administered Medications  Medication Dose Route Frequency Provider Last Rate Last Admin   triamcinolone  acetonide (KENALOG ) 10 MG/ML injection 10 mg  10 mg Other Once Stover, Titorya, DPM        Allergies:   Ivp dye [iodinated contrast media]    Social History:  The patient  reports that she has never smoked. She has never used smokeless tobacco. She reports current alcohol use. She reports that she does not use drugs.   Family History:  The patient's family history includes Cerebral aneurysm in her father.    ROS:  Please see the history of present illness.   Otherwise, review of systems are positive for none.   All other systems are reviewed and negative.    PHYSICAL EXAM: VS:  BP (!) 158/80   Pulse 70   Ht 5' 1 (1.549 m)   Wt 109 lb (49.4 kg)   SpO2 97%   BMI 20.60 kg/m  , BMI Body mass index is 20.6 kg/m. GEN: Well nourished, well developed, in no acute distress  HEENT: normal  Neck: no JVD, carotid bruits, or masses Cardiac: RRR; no rubs, or gallops,no edema .2/ 6 systolic ejection murmur in the aortic area which is mid peaking. Respiratory:  clear to auscultation bilaterally, normal work of breathing GI: soft, nontender, nondistended, + BS. No abdominal bruit MS: no deformity or atrophy  Skin: warm and dry, no rash Neuro:  Strength and sensation are intact Psych: euthymic mood, full affect Vascular: Distal pulses are not palpable.  EKG:  EKG is not ordered today.        Recent Labs: 12/21/2022: Magnesium  2.2 01/02/2023: B Natriuretic Peptide 469.4 03/23/2023: ALT 12; TSH  1.157 05/04/2023: BUN 26; Creatinine, Ser 1.10; Hemoglobin 10.2; Platelets 226; Potassium 4.6; Sodium 144    Lipid Panel No results found for: CHOL, TRIG, HDL, CHOLHDL, VLDL, LDLCALC, LDLDIRECT    Wt Readings from Last 3 Encounters:  07/27/23 109 lb (49.4 kg)  05/04/23 101 lb 6.6 oz (46 kg)  04/20/23 102 lb 6.4 oz (46.4 kg)           No data to display            ASSESSMENT AND PLAN:  1.  Peripheral arterial disease: She has moderately reduced ABI bilaterally with abnormal distal pulses.  Currently with no claudication and no open ulceration.  Continue medical therapy  2.  Aortic stenosis: This was mild on most recent echocardiogram.  No plans for repeat echocardiogram as she is not a candidate for TAVR considering her age.  3.  Essential hypertension: Very labile blood pressure.  This improved after stopping clonidine .  Continue current management including carvedilol , irbesartan  and hydralazine  to be used as needed.    4.  Hyperlipidemia: Continue treatment with atorvastatin  20 mg daily.   Disposition:   FU with me in 6 months.  Signed,  Deatrice Cage, MD  07/27/2023 10:48 AM    [facility]

## 2023-10-04 ENCOUNTER — Encounter: Payer: Self-pay | Admitting: Orthopedic Surgery

## 2023-10-04 ENCOUNTER — Ambulatory Visit: Admitting: Orthopedic Surgery

## 2023-10-04 DIAGNOSIS — L97511 Non-pressure chronic ulcer of other part of right foot limited to breakdown of skin: Secondary | ICD-10-CM | POA: Diagnosis not present

## 2023-10-04 DIAGNOSIS — M6702 Short Achilles tendon (acquired), left ankle: Secondary | ICD-10-CM

## 2023-10-04 DIAGNOSIS — L97521 Non-pressure chronic ulcer of other part of left foot limited to breakdown of skin: Secondary | ICD-10-CM | POA: Diagnosis not present

## 2023-10-04 DIAGNOSIS — M6701 Short Achilles tendon (acquired), right ankle: Secondary | ICD-10-CM | POA: Diagnosis not present

## 2023-10-04 DIAGNOSIS — M205X2 Other deformities of toe(s) (acquired), left foot: Secondary | ICD-10-CM

## 2023-10-04 NOTE — Progress Notes (Signed)
 Office Visit Note   Patient: Kristen Pham           Date of Birth: 04-Jan-1925           MRN: [id number] Visit Date: 10/04/2023              Requested by: Claudene Lacks, MD [address] [place],  [place] [address] PCP: Claudene Lacks, MD  Chief Complaint  Patient presents with   Left Foot - Follow-up      HPI: Discussed the use of AI scribe software for clinical note transcription with the patient, who gave verbal consent to proceed.  History of Present Illness Kristen Pham is a 89 year old female who presents with severe foot pain and difficulty walking.  She experiences severe pain in her feet, particularly affecting the heel and the ball of the foot, which worsens with walking and limits her to taking small steps.  Her daughter noted that the middle toe on the right foot was oozing again as of Friday, and there is a sensation of a knot under the big toe on the left side, which has been bothersome for the past couple of weeks. Heel pain has been persistent, and she has been using Voltaren  applied by the occupational therapist at [facility], along with sleeping with a pillow under her calves to alleviate pressure on her feet.  Foam separators are used between her toes to prevent them from rubbing against each other, which has been causing pressure points, particularly on the third toe. Home health is involved in her care to ensure these measures are implemented.     Assessment & Plan: Visit Diagnoses:  1. Acquired claw toe, left   2. Ulcer of both feet, limited to breakdown of skin (HCC)   3. Contracture of both Achilles tendons     Plan: Assessment and Plan Assessment & Plan Bilateral Achilles tendon contracture (left worse than right) Achilles tendon contracture bilaterally, left more affected. Limited dorsiflexion contributes to pain and ambulation difficulty. - Instruct on daily Achilles stretching exercises using a non-stretchable strap, sheet, or  towel. - Consult occupational therapy for assistance with stretching exercises.  Gait abnormality (shuffling gait) secondary to foot deformities Shuffling gait due to foot deformities, including Achilles tendon contracture and claw toe deformity, increases pressure on the ball of the foot. - Encourage use of a walker to prevent falls and maintain balance. - Focus on stretching exercises to improve foot flexion and reduce shuffling gait. - Reinforce marching exercises to improve gait mechanics.  Bilateral claw toe deformity with fat pad migration Claw toe deformity with distal fat pad migration causes pain during ambulation due to skin and bone contact across metatarsal heads. - Use foam separators between toes to prevent pressure points and reduce pain. - Discontinue use of Voltaren  as it is not effective for foot pain.  Healing ulcer, left second web space Healing ulcer in left second web space, previously oozing, without redness, cellulitis, or plantar ulcers. - Ensure home health places spacers between toes to prevent further irritation.      Follow-Up Instructions: Return if symptoms worsen or fail to improve.   Ortho Exam  Patient is alert, oriented, no adenopathy, well-dressed, normal affect, normal respiratory effort. Physical Exam EXTREMITIES: Osteoarthritis in upper and lower extremities. MUSCULOSKELETAL: Achilles contracture worse on left than right. Dorsiflexion 30 degrees short of neutral in left foot. Dorsiflexion 20 degrees short of neutral in right foot. Clawing of toes with distal migration of fat pad.  Skin and bone across metatarsal heads. Shuffling gait. SKIN: Healing ulcer in second web space. No redness, cellulitis, or plantar ulcers.      Imaging: No results found. No images are attached to the encounter.  Labs: Lab Results  Component Value Date   HGBA1C 5.9 (H) 05/15/2014   ESRSEDRATE 83 (H) 12/23/2022   ESRSEDRATE 61 (H) 12/02/2009   CRP 11.9 (H)  12/02/2009   LABURIC 6.4 12/23/2022     Lab Results  Component Value Date   ALBUMIN 3.2 (L) 03/23/2023   ALBUMIN 3.0 (L) 01/02/2023   ALBUMIN 2.8 (L) 12/23/2022    Lab Results  Component Value Date   MG 2.2 12/21/2022   MG 1.9 12/20/2022   No results found for: VD25OH  No results found for: PREALBUMIN    Latest Ref Rng & Units 05/04/2023    4:01 PM 03/23/2023    2:43 PM 01/02/2023    5:51 AM  CBC EXTENDED  WBC 4.0 - 10.5 K/uL 11.4  5.6  8.8   RBC 3.87 - 5.11 MIL/uL 3.32  3.81  4.21   Hemoglobin 12.0 - 15.0 g/dL 89.7  88.3  87.5   HCT 36.0 - 46.0 % 32.0  36.2  38.9   Platelets 150 - 400 K/uL 226  242  483   NEUT# 1.7 - 7.7 K/uL 9.4  3.3  6.4   Lymph# 0.7 - 4.0 K/uL 0.9  1.4  1.5      There is no height or weight on file to calculate BMI.  Orders:  No orders of the defined types were placed in this encounter.  No orders of the defined types were placed in this encounter.    Procedures: No procedures performed  Clinical Data: No additional findings.  ROS:  All other systems negative, except as noted in the HPI. Review of Systems  Objective: Vital Signs: There were no vitals taken for this visit.  Specialty Comments:  No specialty comments available.  PMFS History: Patient Active Problem List   Diagnosis Date Noted   Essential hypertension 12/18/2022   Anxiety with depression 12/18/2022   Anemia 12/18/2022   Acute on chronic diastolic CHF (congestive heart failure) (HCC) 12/18/2022   Acute respiratory failure (HCC) 12/18/2022   PAD (peripheral artery disease) (HCC) 04/21/2022   Bilateral pseudophakia 02/17/2021   Corneal edema of right eye 02/17/2021   Neuralgia 11/07/2018   Sensorineural hearing loss (SNHL) of both ears 10/12/2018   Non-pressure chronic ulcer of other part of left foot limited to breakdown of skin (HCC) 01/05/2017   Acquired claw toe, left 10/07/2016   Hard corn 10/07/2016   Chronic toe pain, left foot 08/25/2016   Meniscal  cyst 08/23/2012   Knee pain, bilateral 06/21/2012   Primary osteoarthritis of both knees 06/21/2012   Generalized osteoarthritis of hand 06/21/2012   Past Medical History:  Diagnosis Date   Anemia    Carpal tunnel syndrome    CHF (congestive heart failure) (HCC)    Depression    Hypertension    Osteoarthritis    PVD (peripheral vascular disease) (HCC)     Family History  Problem Relation Age of Onset   Cerebral aneurysm Father     Past Surgical History:  Procedure Laterality Date   ABDOMINAL HYSTERECTOMY     CESAREAN SECTION     HAND SURGERY     bilateral release    TONSILECTOMY, ADENOIDECTOMY, BILATERAL MYRINGOTOMY AND TUBES     Social History   Occupational History  Occupation: retired   Tobacco Use   Smoking status: Never   Smokeless tobacco: Never  Vaping Use   Vaping status: Never Used  Substance and Sexual Activity   Alcohol use: Yes    Alcohol/week: 0.0 standard drinks of alcohol    Comment: occasionally    Drug use: No   Sexual activity: Not on file

## 2023-10-06 ENCOUNTER — Ambulatory Visit: Admitting: Family

## 2023-10-06 ENCOUNTER — Encounter: Payer: Self-pay | Admitting: Family

## 2023-10-06 DIAGNOSIS — M205X2 Other deformities of toe(s) (acquired), left foot: Secondary | ICD-10-CM

## 2023-10-06 DIAGNOSIS — L03115 Cellulitis of right lower limb: Secondary | ICD-10-CM

## 2023-10-06 MED ORDER — DOXYCYCLINE HYCLATE 100 MG PO TABS: 100.0000 mg | ORAL_TABLET | Freq: Two times a day (BID) | ORAL | 0 refills | Status: DC

## 2023-10-08 NOTE — Progress Notes (Signed)
 Office Visit Note   Patient: Kristen Pham           Date of Birth: 1924/12/31           MRN: [id number] Visit Date: 10/06/2023              Requested by: Claudene Lacks, MD [address] [place],  [place] [address] PCP: Claudene Lacks, MD  Chief Complaint  Patient presents with   Left Foot - Follow-up      HPI: The patient is a 89 year old woman who presents today for concern of worsening to the second toe of the right foot she has a history of chronic osteomyelitis and has had recurrence of her painful hyperkeratotic tissue recurrence of the ulcer unfortunately she had not been using her spacer recently  No fever no chills  Assessment & Plan: Visit Diagnoses: No diagnosis found.  Plan: Hyperkeratotic tissue was debrided.  Patient voiced minimal relief we will begin using mupirocin  for dressing changes and I will place her on a course of antibiotic she will follow-up in office in 2 weeks  Have also provided new spacers  Follow-Up Instructions: Return in about 2 weeks (around 10/20/2023).   Ortho Exam  Patient is alert, oriented, no adenopathy, well-dressed, normal affect, normal respiratory effort. On examination right foot ulceration in the webspace to the second toe this is debrided of hyperkeratotic tissue there is underlying ulcer with flat pink tissue this is 3 mm deep there is no exposed bone no purulence no ascending of cellulitis Is exquisitely tender    Imaging: No results found. No images are attached to the encounter.  Labs: Lab Results  Component Value Date   HGBA1C 5.9 (H) 05/15/2014   ESRSEDRATE 83 (H) 12/23/2022   ESRSEDRATE 61 (H) 12/02/2009   CRP 11.9 (H) 12/02/2009   LABURIC 6.4 12/23/2022     Lab Results  Component Value Date   ALBUMIN 3.2 (L) 03/23/2023   ALBUMIN 3.0 (L) 01/02/2023   ALBUMIN 2.8 (L) 12/23/2022    Lab Results  Component Value Date   MG 2.2 12/21/2022   MG 1.9 12/20/2022   No results found for: VD25OH  No  results found for: PREALBUMIN    Latest Ref Rng & Units 05/04/2023    4:01 PM 03/23/2023    2:43 PM 01/02/2023    5:51 AM  CBC EXTENDED  WBC 4.0 - 10.5 K/uL 11.4  5.6  8.8   RBC 3.87 - 5.11 MIL/uL 3.32  3.81  4.21   Hemoglobin 12.0 - 15.0 g/dL 89.7  88.3  87.5   HCT 36.0 - 46.0 % 32.0  36.2  38.9   Platelets 150 - 400 K/uL 226  242  483   NEUT# 1.7 - 7.7 K/uL 9.4  3.3  6.4   Lymph# 0.7 - 4.0 K/uL 0.9  1.4  1.5      There is no height or weight on file to calculate BMI.  Orders:  No orders of the defined types were placed in this encounter.  Meds ordered this encounter  Medications   doxycycline  (VIBRA -TABS) 100 MG tablet    Sig: Take 1 tablet (100 mg total) by mouth 2 (two) times daily.    Dispense:  60 tablet    Refill:  0     Procedures: No procedures performed  Clinical Data: No additional findings.  ROS:  All other systems negative, except as noted in the HPI. Review of Systems  Objective: Vital Signs: There were no  vitals taken for this visit.  Specialty Comments:  No specialty comments available.  PMFS History: Patient Active Problem List   Diagnosis Date Noted   Essential hypertension 12/18/2022   Anxiety with depression 12/18/2022   Anemia 12/18/2022   Acute on chronic diastolic CHF (congestive heart failure) (HCC) 12/18/2022   Acute respiratory failure (HCC) 12/18/2022   PAD (peripheral artery disease) (HCC) 04/21/2022   Bilateral pseudophakia 02/17/2021   Corneal edema of right eye 02/17/2021   Neuralgia 11/07/2018   Sensorineural hearing loss (SNHL) of both ears 10/12/2018   Non-pressure chronic ulcer of other part of left foot limited to breakdown of skin (HCC) 01/05/2017   Acquired claw toe, left 10/07/2016   Hard corn 10/07/2016   Chronic toe pain, left foot 08/25/2016   Meniscal cyst 08/23/2012   Knee pain, bilateral 06/21/2012   Primary osteoarthritis of both knees 06/21/2012   Generalized osteoarthritis of hand 06/21/2012   Past  Medical History:  Diagnosis Date   Anemia    Carpal tunnel syndrome    CHF (congestive heart failure) (HCC)    Depression    Hypertension    Osteoarthritis    PVD (peripheral vascular disease) (HCC)     Family History  Problem Relation Age of Onset   Cerebral aneurysm Father     Past Surgical History:  Procedure Laterality Date   ABDOMINAL HYSTERECTOMY     CESAREAN SECTION     HAND SURGERY     bilateral release    TONSILECTOMY, ADENOIDECTOMY, BILATERAL MYRINGOTOMY AND TUBES     Social History   Occupational History   Occupation: retired   Tobacco Use   Smoking status: Never   Smokeless tobacco: Never  Vaping Use   Vaping status: Never Used  Substance and Sexual Activity   Alcohol use: Yes    Alcohol/week: 0.0 standard drinks of alcohol    Comment: occasionally    Drug use: No   Sexual activity: Not on file

## 2023-10-15 ENCOUNTER — Telehealth: Payer: Self-pay | Admitting: Cardiovascular Disease

## 2023-10-15 ENCOUNTER — Encounter: Payer: Self-pay | Admitting: Cardiovascular Disease

## 2023-10-15 NOTE — Telephone Encounter (Signed)
 Spoke with pt daughter, the patient has had unusually low blood pressure today. The patient has no complaints but the daughter reports she has not been as active today either. The irregular pulse light on her blood pressure monitor has been lighting up and they have checked her pulse and she is having skipped beats. The daughter also reports she seems weaker than usual. Aware to increase fluids and eat some extra salt and they can hold the irbesartan  this evening if her blood pressure continues to trend low. She will check to see about an apple watch or kardia mobile to get an ECG to send for us  to review. Aware will forward to dr darron for his recommendations.

## 2023-10-15 NOTE — Telephone Encounter (Signed)
 Pt c/o BP issue: STAT if pt c/o blurred vision, one-sided weakness or slurred speech.  STAT if BP is GREATER than 180/120 TODAY.  STAT if BP is LESS than 90/60 and SYMPTOMATIC TODAY  1. What is your BP concern?   Daughter Bethann) is concerned patient has been having low BP readings  2. Have you taken any BP medication today?  Yes - morning meds  3. What are your last 5 BP readings?  9/25 7:00 pm - 192/81  9/26 8:21 am - 126/74 9:35 am - 82/53 10:30 am - 112/54 1:00 pm - 95/64  4. Are you having any other symptoms (ex. Dizziness, headache, blurred vision, passed out)?  Irregular HR  Daughter Bethann) stated patient reported this morning that she has not been feeling well.  Daughter noted patient's automated cuff shows she was having irregular heart beats.

## 2023-10-15 NOTE — Telephone Encounter (Signed)
Daughter is returning call. Please advise

## 2023-10-15 NOTE — Telephone Encounter (Signed)
 Left a message to call back.

## 2023-10-19 MED ORDER — IRBESARTAN 75 MG PO TABS: 75.0000 mg | ORAL_TABLET | Freq: Two times a day (BID) | ORAL | Status: DC

## 2023-10-20 MED ORDER — IRBESARTAN 75 MG PO TABS: 75.0000 mg | ORAL_TABLET | Freq: Two times a day (BID) | ORAL | 3 refills | Status: DC

## 2023-10-20 NOTE — Addendum Note (Signed)
 Addended by: BRIEN SALM on: 10/20/2023 10:01 AM   Modules accepted: Orders

## 2023-10-21 MED ORDER — IRBESARTAN 75 MG PO TABS: 75.0000 mg | ORAL_TABLET | Freq: Two times a day (BID) | ORAL | 3 refills | Status: AC

## 2023-10-21 NOTE — Addendum Note (Signed)
 Addended by: Mercedes Fort N on: 10/21/2023 04:09 PM   Modules accepted: Orders

## 2023-11-22 ENCOUNTER — Encounter: Payer: Self-pay | Admitting: Radiology

## 2023-11-25 ENCOUNTER — Ambulatory Visit: Admitting: Podiatry

## 2023-12-20 ENCOUNTER — Ambulatory Visit: Admitting: Physician Assistant

## 2023-12-20 ENCOUNTER — Encounter: Payer: Self-pay | Admitting: Physician Assistant

## 2023-12-20 DIAGNOSIS — L97521 Non-pressure chronic ulcer of other part of left foot limited to breakdown of skin: Secondary | ICD-10-CM

## 2023-12-20 DIAGNOSIS — L97511 Non-pressure chronic ulcer of other part of right foot limited to breakdown of skin: Secondary | ICD-10-CM | POA: Diagnosis not present

## 2023-12-20 NOTE — Progress Notes (Signed)
 Office Visit Note   Patient: Kristen Pham           Date of Birth: [birth year]           MRN: [id number] Visit Date: 12/20/2023              Requested by: Claudene Lacks, MD [address] [place],  [place] [address] PCP: Claudene Lacks, MD  Chief Complaint  Patient presents with   Left Foot - Wound Check      HPI: 89 y/o female with history of chronic right foot osteomyelitis.  She was last seen on 10/06/23 for debridement of painful hyperkeratotic tissue.  She has now developed a second left toe ulcer between the first and second toe.  There is mild cellulitis at the base of the second toe.   Her daughter is here for a visit from [place] .  She resides at an assisted living facility.    Assessment & Plan: Visit Diagnoses:  1. Ulcer of both feet, limited to breakdown of skin (HCC)     Plan: I will place her on Doxycycline  100 mg BID for 2 weeks due to the cellulitis at the base of the left second toe.  Wet to dry 4 x 4 guaze between the toes.  She may shower PRN with soap and water.  I would not use the silicone toe spacer for now.  I will order ABI's to check her arterial inflow is sufficient enough to heal the wounds.    Follow-Up Instructions: Return in about 9 days (around 12/29/2023).   Ortho Exam  Patient is alert, oriented, no adenopathy, well-dressed, normal affect, normal respiratory effort. Doppler signals right monophasic brisk  DP/Peroneal/PT.  Left PT monophasic, weaker DP/Peroneal monophasic signals.  The callus was trimmed fro the third toe.  The second toes ulcers were cleaned with Vashe.           Imaging: No results found. No images are attached to the encounter.  Labs: Lab Results  Component Value Date   HGBA1C 5.9 (H) 05/15/2014   ESRSEDRATE 83 (H) 12/23/2022   ESRSEDRATE 61 (H) 12/02/2009   CRP 11.9 (H) 12/02/2009   LABURIC 6.4 12/23/2022     Lab Results  Component Value Date   ALBUMIN 3.2 (L) 03/23/2023   ALBUMIN 3.0 (L) 01/02/2023    ALBUMIN 2.8 (L) 12/23/2022    Lab Results  Component Value Date   MG 2.2 12/21/2022   MG 1.9 12/20/2022   No results found for: VD25OH  No results found for: PREALBUMIN    Latest Ref Rng & Units 05/04/2023    4:01 PM 03/23/2023    2:43 PM 01/02/2023    5:51 AM  CBC EXTENDED  WBC 4.0 - 10.5 K/uL 11.4  5.6  8.8   RBC 3.87 - 5.11 MIL/uL 3.32  3.81  4.21   Hemoglobin 12.0 - 15.0 g/dL 89.7  88.3  87.5   HCT 36.0 - 46.0 % 32.0  36.2  38.9   Platelets 150 - 400 K/uL 226  242  483   NEUT# 1.7 - 7.7 K/uL 9.4  3.3  6.4   Lymph# 0.7 - 4.0 K/uL 0.9  1.4  1.5      There is no height or weight on file to calculate BMI.  Orders:  Orders Placed This Encounter  Procedures   VAS US  ABI WITH/WO TBI   No orders of the defined types were placed in this encounter.    Procedures: No procedures performed  Clinical Data: No additional findings.  ROS:  All other systems negative, except as noted in the HPI. Review of Systems  Objective: Vital Signs: There were no vitals taken for this visit.  Specialty Comments:  No specialty comments available.  PMFS History: Patient Active Problem List   Diagnosis Date Noted   Essential hypertension 12/18/2022   Anxiety with depression 12/18/2022   Anemia 12/18/2022   Acute on chronic diastolic CHF (congestive heart failure) (HCC) 12/18/2022   Acute respiratory failure (HCC) 12/18/2022   PAD (peripheral artery disease) 04/21/2022   Bilateral pseudophakia 02/17/2021   Corneal edema of right eye 02/17/2021   Neuralgia 11/07/2018   Sensorineural hearing loss (SNHL) of both ears 10/12/2018   Non-pressure chronic ulcer of other part of left foot limited to breakdown of skin (HCC) 01/05/2017   Acquired claw toe, left 10/07/2016   Hard corn 10/07/2016   Chronic toe pain, left foot 08/25/2016   Meniscal cyst 08/23/2012   Knee pain, bilateral 06/21/2012   Primary osteoarthritis of both knees 06/21/2012   Generalized osteoarthritis of hand  06/21/2012   Past Medical History:  Diagnosis Date   Anemia    Carpal tunnel syndrome    CHF (congestive heart failure) (HCC)    Depression    Hypertension    Osteoarthritis    PVD (peripheral vascular disease)     Family History  Problem Relation Age of Onset   Cerebral aneurysm Father     Past Surgical History:  Procedure Laterality Date   ABDOMINAL HYSTERECTOMY     CESAREAN SECTION     HAND SURGERY     bilateral release    TONSILECTOMY, ADENOIDECTOMY, BILATERAL MYRINGOTOMY AND TUBES     Social History   Occupational History   Occupation: retired   Tobacco Use   Smoking status: Never   Smokeless tobacco: Never  Vaping Use   Vaping status: Never Used  Substance and Sexual Activity   Alcohol use: Yes    Alcohol/week: 0.0 standard drinks of alcohol    Comment: occasionally    Drug use: No   Sexual activity: Not on file

## 2023-12-21 ENCOUNTER — Encounter: Payer: Self-pay | Admitting: Cardiovascular Disease

## 2023-12-29 ENCOUNTER — Ambulatory Visit: Admitting: Physician Assistant

## 2023-12-29 ENCOUNTER — Other Ambulatory Visit: Payer: Self-pay

## 2023-12-29 ENCOUNTER — Encounter: Payer: Self-pay | Admitting: Physician Assistant

## 2023-12-29 DIAGNOSIS — L97521 Non-pressure chronic ulcer of other part of left foot limited to breakdown of skin: Secondary | ICD-10-CM | POA: Diagnosis not present

## 2023-12-29 DIAGNOSIS — L97511 Non-pressure chronic ulcer of other part of right foot limited to breakdown of skin: Secondary | ICD-10-CM | POA: Diagnosis not present

## 2023-12-29 NOTE — Progress Notes (Signed)
 Office Visit Note   Patient: Kristen Pham           Date of Birth: 04-23-1924           MRN: [id number] Visit Date: 12/29/2023              Requested by: Claudene Lacks, MD [address] [place],  [place] [address] PCP: Claudene Lacks, MD  Chief Complaint  Patient presents with   Left Foot - Wound Check      HPI: 89 y/o female is here for follow up.  She has developed a left second toe ulcer that is between the great toe and the second toe.  She was wearing a spacer secondary to the bunion deformity.  On her last visit we discontinued the spacer use and started wet to dry Vashe and 4 x 4 between the toes daily.  She was also started on empiric oral antibiotics for 2 weeks using Doxycycline .    Assessment & Plan: Visit Diagnoses:  1. Ulcer of both feet, limited to breakdown of skin (HCC)     Plan: I will discontinue the Vashe wet to dry and start betadine painting of the ulcer daily with dry guaze between the Gt and second toe.  I recommended no more toe spacers be used after this breakdown of skin.  Shower as needed with soap and water.  Supportive shoe wear with a wide toe box to accommodate the bunion.  She will continue the oral antibiotic for one more week.  We are pending ABI's.    Follow-Up Instructions: Return in about 1 week (around 01/05/2024).   Ortho Exam  Patient is alert, oriented, no adenopathy, well-dressed, normal affect, normal respiratory effort. The second toe ulcer has macerated skin between the toes.  No increased edema or frank cellulitis.  Doppler signals monophasic DP/PT.    Foot x rays were negative for bone lesions.  She has midfoot arthritis at the MTP tarsals and Bunion deformity.      Imaging: No results found. No images are attached to the encounter.  Labs: Lab Results  Component Value Date   HGBA1C 5.9 (H) 05/15/2014   ESRSEDRATE 83 (H) 12/23/2022   ESRSEDRATE 61 (H) 12/02/2009   CRP 11.9 (H) 12/02/2009   LABURIC 6.4 12/23/2022      Lab Results  Component Value Date   ALBUMIN 3.2 (L) 03/23/2023   ALBUMIN 3.0 (L) 01/02/2023   ALBUMIN 2.8 (L) 12/23/2022    Lab Results  Component Value Date   MG 2.2 12/21/2022   MG 1.9 12/20/2022   No results found for: VD25OH  No results found for: PREALBUMIN    Latest Ref Rng & Units 05/04/2023    4:01 PM 03/23/2023    2:43 PM 01/02/2023    5:51 AM  CBC EXTENDED  WBC 4.0 - 10.5 K/uL 11.4  5.6  8.8   RBC 3.87 - 5.11 MIL/uL 3.32  3.81  4.21   Hemoglobin 12.0 - 15.0 g/dL 89.7  88.3  87.5   HCT 36.0 - 46.0 % 32.0  36.2  38.9   Platelets 150 - 400 K/uL 226  242  483   NEUT# 1.7 - 7.7 K/uL 9.4  3.3  6.4   Lymph# 0.7 - 4.0 K/uL 0.9  1.4  1.5      There is no height or weight on file to calculate BMI.  Orders:  Orders Placed This Encounter  Procedures   XR Foot Complete Left   No orders  of the defined types were placed in this encounter.    Procedures: No procedures performed  Clinical Data: No additional findings.  ROS:  All other systems negative, except as noted in the HPI. Review of Systems  Objective: Vital Signs: There were no vitals taken for this visit.  Specialty Comments:  No specialty comments available.  PMFS History: Patient Active Problem List   Diagnosis Date Noted   Essential hypertension 12/18/2022   Anxiety with depression 12/18/2022   Anemia 12/18/2022   Acute on chronic diastolic CHF (congestive heart failure) (HCC) 12/18/2022   Acute respiratory failure (HCC) 12/18/2022   PAD (peripheral artery disease) 04/21/2022   Bilateral pseudophakia 02/17/2021   Corneal edema of right eye 02/17/2021   Neuralgia 11/07/2018   Sensorineural hearing loss (SNHL) of both ears 10/12/2018   Non-pressure chronic ulcer of other part of left foot limited to breakdown of skin (HCC) 01/05/2017   Acquired claw toe, left 10/07/2016   Hard corn 10/07/2016   Chronic toe pain, left foot 08/25/2016   Meniscal cyst 08/23/2012   Knee pain,  bilateral 06/21/2012   Primary osteoarthritis of both knees 06/21/2012   Generalized osteoarthritis of hand 06/21/2012   Past Medical History:  Diagnosis Date   Anemia    Carpal tunnel syndrome    CHF (congestive heart failure) (HCC)    Depression    Hypertension    Osteoarthritis    PVD (peripheral vascular disease)     Family History  Problem Relation Age of Onset   Cerebral aneurysm Father     Past Surgical History:  Procedure Laterality Date   ABDOMINAL HYSTERECTOMY     CESAREAN SECTION     HAND SURGERY     bilateral release    TONSILECTOMY, ADENOIDECTOMY, BILATERAL MYRINGOTOMY AND TUBES     Social History   Occupational History   Occupation: retired   Tobacco Use   Smoking status: Never   Smokeless tobacco: Never  Vaping Use   Vaping status: Never Used  Substance and Sexual Activity   Alcohol use: Yes    Alcohol/week: 0.0 standard drinks of alcohol    Comment: occasionally    Drug use: No   Sexual activity: Not on file

## 2024-01-05 ENCOUNTER — Ambulatory Visit (HOSPITAL_COMMUNITY)
Admission: RE | Admit: 2024-01-05 | Discharge: 2024-01-05 | Attending: Physician Assistant | Admitting: Physician Assistant

## 2024-01-05 ENCOUNTER — Ambulatory Visit: Admitting: Physician Assistant

## 2024-01-05 ENCOUNTER — Telehealth: Payer: Self-pay | Admitting: Family

## 2024-01-05 DIAGNOSIS — L97521 Non-pressure chronic ulcer of other part of left foot limited to breakdown of skin: Secondary | ICD-10-CM

## 2024-01-05 DIAGNOSIS — I739 Peripheral vascular disease, unspecified: Secondary | ICD-10-CM

## 2024-01-05 DIAGNOSIS — L97511 Non-pressure chronic ulcer of other part of right foot limited to breakdown of skin: Secondary | ICD-10-CM

## 2024-01-05 DIAGNOSIS — M205X2 Other deformities of toe(s) (acquired), left foot: Secondary | ICD-10-CM

## 2024-01-05 LAB — VAS US ABI WITH/WO TBI
Left ABI: 0.56
Right ABI: 0.7

## 2024-01-05 NOTE — Telephone Encounter (Signed)
 Pt's daughter called stating pt's nursing facility is asking for clear orders for pt to stop using batadin wash/gel ??. Please fax orders to [facility] at [phone].

## 2024-01-06 ENCOUNTER — Other Ambulatory Visit: Payer: Self-pay | Admitting: Physician Assistant

## 2024-01-06 NOTE — Addendum Note (Signed)
 Addended by: GEROME MAURILIO HERO on: 01/06/2024 03:58 PM   Modules accepted: Orders

## 2024-01-06 NOTE — Telephone Encounter (Signed)
 We can change the order to stay give hydralazine  as needed for systolic blood pressure greater than 200.  Call EMS if blood pressure remains above 200 after 3 hours.

## 2024-01-06 NOTE — Telephone Encounter (Signed)
 Waiting for OV note to be completed so I can write the order and fax over to facility. Jolynn is not in office today)

## 2024-01-07 ENCOUNTER — Encounter: Payer: Self-pay | Admitting: Family

## 2024-01-07 MED ORDER — HYDRALAZINE HCL 25 MG PO TABS: 25.0000 mg | ORAL_TABLET | Freq: Three times a day (TID) | ORAL | 0 refills | Status: DC | PRN

## 2024-01-07 NOTE — Progress Notes (Signed)
 "  Office Visit Note   Patient: Kristen Pham           Date of Birth: November 19, 1924           MRN: [id number] Visit Date: 01/05/2024              Requested by: Claudene Lacks, MD [address] [place],  [place] [address] PCP: Claudene Lacks, MD  Chief Complaint  Patient presents with   Left Foot - Follow-up      HPI: The patient is a 89 year old woman who presents today in follow-up for ulceration in the first webspace of the left foot.  They have been using Vashe soaked gauze for dressing changes she has not had fevers chills or any change in her wound  Has a appointment with vascular for ABIs today  Assessment & Plan: Visit Diagnoses: No diagnosis found.  Plan: Continue cleansing with Vashe. have provided hydrogel to apply daily to the ulcer after cleansing with Vashe.  Will not attempt to offload the area with toe spacers as these have caused pressure points in the past  Follow-Up Instructions: No follow-ups on file.   Ortho Exam  Patient is alert, oriented, no adenopathy, well-dressed, normal affect, normal respiratory effort. On examination left foot in the first webspace there is unfortunately open ulceration which is about 5 mm in diameter to the base of the second toe there is exposed tendon and necrotic tissue,.  There is no active drainage this does not probe there is no surrounding erythema    Imaging: No results found. No images are attached to the encounter.  Labs: Lab Results  Component Value Date   HGBA1C 5.9 (H) 05/15/2014   ESRSEDRATE 83 (H) 12/23/2022   ESRSEDRATE 61 (H) 12/02/2009   CRP 11.9 (H) 12/02/2009   LABURIC 6.4 12/23/2022     Lab Results  Component Value Date   ALBUMIN 3.2 (L) 03/23/2023   ALBUMIN 3.0 (L) 01/02/2023   ALBUMIN 2.8 (L) 12/23/2022    Lab Results  Component Value Date   MG 2.2 12/21/2022   MG 1.9 12/20/2022   No results found for: VD25OH  No results found for: PREALBUMIN    Latest Ref Rng & Units 05/04/2023     4:01 PM 03/23/2023    2:43 PM 01/02/2023    5:51 AM  CBC EXTENDED  WBC 4.0 - 10.5 K/uL 11.4  5.6  8.8   RBC 3.87 - 5.11 MIL/uL 3.32  3.81  4.21   Hemoglobin 12.0 - 15.0 g/dL 89.7  88.3  87.5   HCT 36.0 - 46.0 % 32.0  36.2  38.9   Platelets 150 - 400 K/uL 226  242  483   NEUT# 1.7 - 7.7 K/uL 9.4  3.3  6.4   Lymph# 0.7 - 4.0 K/uL 0.9  1.4  1.5      There is no height or weight on file to calculate BMI.  Orders:  No orders of the defined types were placed in this encounter.  No orders of the defined types were placed in this encounter.    Procedures: No procedures performed  Clinical Data: No additional findings.  ROS:  All other systems negative, except as noted in the HPI. Review of Systems  Objective: Vital Signs: There were no vitals taken for this visit.  Specialty Comments:  No specialty comments available.  PMFS History: Patient Active Problem List   Diagnosis Date Noted   Essential hypertension 12/18/2022   Anxiety with depression 12/18/2022  Anemia 12/18/2022   Acute on chronic diastolic CHF (congestive heart failure) (HCC) 12/18/2022   Acute respiratory failure (HCC) 12/18/2022   PAD (peripheral artery disease) 04/21/2022   Bilateral pseudophakia 02/17/2021   Corneal edema of right eye 02/17/2021   Neuralgia 11/07/2018   Sensorineural hearing loss (SNHL) of both ears 10/12/2018   Non-pressure chronic ulcer of other part of left foot limited to breakdown of skin (HCC) 01/05/2017   Acquired claw toe, left 10/07/2016   Hard corn 10/07/2016   Chronic toe pain, left foot 08/25/2016   Meniscal cyst 08/23/2012   Knee pain, bilateral 06/21/2012   Primary osteoarthritis of both knees 06/21/2012   Generalized osteoarthritis of hand 06/21/2012   Past Medical History:  Diagnosis Date   Anemia    Carpal tunnel syndrome    CHF (congestive heart failure) (HCC)    Depression    Hypertension    Osteoarthritis    PVD (peripheral vascular disease)      Family History  Problem Relation Age of Onset   Cerebral aneurysm Father     Past Surgical History:  Procedure Laterality Date   ABDOMINAL HYSTERECTOMY     CESAREAN SECTION     HAND SURGERY     bilateral release    TONSILECTOMY, ADENOIDECTOMY, BILATERAL MYRINGOTOMY AND TUBES     Social History   Occupational History   Occupation: retired   Tobacco Use   Smoking status: Never   Smokeless tobacco: Never  Vaping Use   Vaping status: Never Used  Substance and Sexual Activity   Alcohol use: Yes    Alcohol/week: 0.0 standard drinks of alcohol    Comment: occasionally    Drug use: No   Sexual activity: Not on file       "

## 2024-01-07 NOTE — Telephone Encounter (Signed)
 Order faxed to [facility]

## 2024-01-10 MED ORDER — HYDRALAZINE HCL 25 MG PO TABS: 25.0000 mg | ORAL_TABLET | Freq: Three times a day (TID) | ORAL | 0 refills | Status: AC | PRN

## 2024-01-10 NOTE — Addendum Note (Signed)
 Addended by: JOSHUA ANDREZ PARAS on: 01/10/2024 05:01 PM   Modules accepted: Orders

## 2024-01-14 ENCOUNTER — Inpatient Hospital Stay (HOSPITAL_COMMUNITY)
Admission: EM | Admit: 2024-01-14 | Discharge: 2024-01-16 | DRG: 071 | Disposition: A | Discharge status: Skilled Nursing Facility | Source: Skilled Nursing Facility | Attending: Hospitalist | Admitting: Hospitalist

## 2024-01-14 ENCOUNTER — Encounter (HOSPITAL_COMMUNITY): Payer: Self-pay | Admitting: Emergency Medicine

## 2024-01-14 ENCOUNTER — Other Ambulatory Visit: Payer: Self-pay

## 2024-01-14 ENCOUNTER — Emergency Department (HOSPITAL_COMMUNITY)

## 2024-01-14 DIAGNOSIS — L97521 Non-pressure chronic ulcer of other part of left foot limited to breakdown of skin: Secondary | ICD-10-CM | POA: Diagnosis present

## 2024-01-14 DIAGNOSIS — I10 Essential (primary) hypertension: Secondary | ICD-10-CM | POA: Diagnosis present

## 2024-01-14 DIAGNOSIS — I739 Peripheral vascular disease, unspecified: Secondary | ICD-10-CM | POA: Diagnosis present

## 2024-01-14 DIAGNOSIS — H903 Sensorineural hearing loss, bilateral: Secondary | ICD-10-CM | POA: Diagnosis present

## 2024-01-14 DIAGNOSIS — Z9181 History of falling: Secondary | ICD-10-CM | POA: Diagnosis not present

## 2024-01-14 DIAGNOSIS — F32A Depression, unspecified: Secondary | ICD-10-CM | POA: Diagnosis present

## 2024-01-14 DIAGNOSIS — R296 Repeated falls: Secondary | ICD-10-CM | POA: Diagnosis present

## 2024-01-14 DIAGNOSIS — D649 Anemia, unspecified: Secondary | ICD-10-CM | POA: Diagnosis present

## 2024-01-14 DIAGNOSIS — Z79899 Other long term (current) drug therapy: Secondary | ICD-10-CM | POA: Diagnosis not present

## 2024-01-14 DIAGNOSIS — I5032 Chronic diastolic (congestive) heart failure: Secondary | ICD-10-CM | POA: Diagnosis present

## 2024-01-14 DIAGNOSIS — G8929 Other chronic pain: Secondary | ICD-10-CM | POA: Diagnosis present

## 2024-01-14 DIAGNOSIS — Z90710 Acquired absence of both cervix and uterus: Secondary | ICD-10-CM

## 2024-01-14 DIAGNOSIS — I080 Rheumatic disorders of both mitral and aortic valves: Secondary | ICD-10-CM | POA: Diagnosis present

## 2024-01-14 DIAGNOSIS — N179 Acute kidney failure, unspecified: Secondary | ICD-10-CM | POA: Diagnosis present

## 2024-01-14 DIAGNOSIS — R7989 Other specified abnormal findings of blood chemistry: Secondary | ICD-10-CM | POA: Diagnosis present

## 2024-01-14 DIAGNOSIS — F418 Other specified anxiety disorders: Secondary | ICD-10-CM | POA: Diagnosis present

## 2024-01-14 DIAGNOSIS — G9341 Metabolic encephalopathy: Secondary | ICD-10-CM | POA: Diagnosis present

## 2024-01-14 DIAGNOSIS — F419 Anxiety disorder, unspecified: Secondary | ICD-10-CM | POA: Diagnosis present

## 2024-01-14 DIAGNOSIS — Z1152 Encounter for screening for COVID-19: Secondary | ICD-10-CM

## 2024-01-14 DIAGNOSIS — Z91041 Radiographic dye allergy status: Secondary | ICD-10-CM | POA: Diagnosis not present

## 2024-01-14 DIAGNOSIS — I110 Hypertensive heart disease with heart failure: Secondary | ICD-10-CM | POA: Diagnosis present

## 2024-01-14 DIAGNOSIS — R5383 Other fatigue: Principal | ICD-10-CM

## 2024-01-14 DIAGNOSIS — I480 Paroxysmal atrial fibrillation: Secondary | ICD-10-CM | POA: Diagnosis not present

## 2024-01-14 DIAGNOSIS — M25511 Pain in right shoulder: Secondary | ICD-10-CM | POA: Diagnosis present

## 2024-01-14 DIAGNOSIS — T68XXXA Hypothermia, initial encounter: Secondary | ICD-10-CM | POA: Diagnosis present

## 2024-01-14 DIAGNOSIS — M1990 Unspecified osteoarthritis, unspecified site: Secondary | ICD-10-CM | POA: Diagnosis present

## 2024-01-14 DIAGNOSIS — R680 Hypothermia, not associated with low environmental temperature: Secondary | ICD-10-CM | POA: Diagnosis present

## 2024-01-14 DIAGNOSIS — Z66 Do not resuscitate: Secondary | ICD-10-CM | POA: Diagnosis present

## 2024-01-14 DIAGNOSIS — I4891 Unspecified atrial fibrillation: Secondary | ICD-10-CM | POA: Diagnosis present

## 2024-01-14 DIAGNOSIS — R531 Weakness: Secondary | ICD-10-CM | POA: Diagnosis present

## 2024-01-14 DIAGNOSIS — R001 Bradycardia, unspecified: Secondary | ICD-10-CM | POA: Diagnosis present

## 2024-01-14 LAB — CBC WITH DIFFERENTIAL/PLATELET
Abs Immature Granulocytes: 0.01 K/uL (ref 0.00–0.07)
Basophils Absolute: 0.1 K/uL (ref 0.0–0.1)
Basophils Relative: 1 %
Eosinophils Absolute: 0.2 K/uL (ref 0.0–0.5)
Eosinophils Relative: 4 %
HCT: 37.3 % (ref 36.0–46.0)
Hemoglobin: 11.7 g/dL — ABNORMAL LOW (ref 12.0–15.0)
Immature Granulocytes: 0 %
Lymphocytes Relative: 26 %
Lymphs Abs: 1.5 K/uL (ref 0.7–4.0)
MCH: 29.8 pg (ref 26.0–34.0)
MCHC: 31.4 g/dL (ref 30.0–36.0)
MCV: 94.9 fL (ref 80.0–100.0)
Monocytes Absolute: 0.5 K/uL (ref 0.1–1.0)
Monocytes Relative: 8 %
Neutro Abs: 3.6 K/uL (ref 1.7–7.7)
Neutrophils Relative %: 61 %
Platelets: 239 K/uL (ref 150–400)
RBC: 3.93 MIL/uL (ref 3.87–5.11)
RDW: 14.3 % (ref 11.5–15.5)
WBC: 5.9 K/uL (ref 4.0–10.5)
nRBC: 0 % (ref 0.0–0.2)

## 2024-01-14 LAB — RESP PANEL BY RT-PCR (RSV, FLU A&B, COVID)  RVPGX2
Influenza A by PCR: NEGATIVE
Influenza B by PCR: NEGATIVE
Resp Syncytial Virus by PCR: NEGATIVE
SARS Coronavirus 2 by RT PCR: NEGATIVE

## 2024-01-14 LAB — URINALYSIS, ROUTINE W REFLEX MICROSCOPIC
Bilirubin Urine: NEGATIVE
Glucose, UA: NEGATIVE mg/dL
Hgb urine dipstick: NEGATIVE
Ketones, ur: NEGATIVE mg/dL
Leukocytes,Ua: NEGATIVE
Nitrite: NEGATIVE
Protein, ur: NEGATIVE mg/dL
Specific Gravity, Urine: 1.008 (ref 1.005–1.030)
pH: 6 (ref 5.0–8.0)

## 2024-01-14 LAB — COMPREHENSIVE METABOLIC PANEL WITH GFR
ALT: 17 U/L (ref 0–44)
AST: 24 U/L (ref 15–41)
Albumin: 3.8 g/dL (ref 3.5–5.0)
Alkaline Phosphatase: 117 U/L (ref 38–126)
Anion gap: 10 (ref 5–15)
BUN: 25 mg/dL — ABNORMAL HIGH (ref 8–23)
CO2: 26 mmol/L (ref 22–32)
Calcium: 9.2 mg/dL (ref 8.9–10.3)
Chloride: 103 mmol/L (ref 98–111)
Creatinine, Ser: 1.28 mg/dL — ABNORMAL HIGH (ref 0.44–1.00)
GFR, Estimated: 38 mL/min — ABNORMAL LOW
Glucose, Bld: 112 mg/dL — ABNORMAL HIGH (ref 70–99)
Potassium: 4.2 mmol/L (ref 3.5–5.1)
Sodium: 139 mmol/L (ref 135–145)
Total Bilirubin: 0.5 mg/dL (ref 0.0–1.2)
Total Protein: 7.2 g/dL (ref 6.5–8.1)

## 2024-01-14 LAB — CBG MONITORING, ED: Glucose-Capillary: 71 mg/dL (ref 70–99)

## 2024-01-14 LAB — AMMONIA: Ammonia: 19 umol/L (ref 9–35)

## 2024-01-14 LAB — I-STAT CG4 LACTIC ACID, ED
Lactic Acid, Venous: 1.3 mmol/L (ref 0.5–1.9)
Lactic Acid, Venous: 1.1 mmol/L (ref 0.5–1.9)

## 2024-01-14 LAB — TROPONIN T, HIGH SENSITIVITY
Troponin T High Sensitivity: 57 ng/L — ABNORMAL HIGH (ref 0–19)
Troponin T High Sensitivity: 48 ng/L — ABNORMAL HIGH (ref 0–19)

## 2024-01-14 LAB — TSH: TSH: 1.22 u[IU]/mL (ref 0.350–4.500)

## 2024-01-14 MED ORDER — SODIUM CHLORIDE 0.9 % IV BOLUS
1000.0000 mL | Freq: Once | INTRAVENOUS | Status: AC
  Administered 2024-01-14: 1000 mL via INTRAVENOUS

## 2024-01-14 NOTE — H&P (Addendum)
 " History and Physical    PatientGarland Pham FMW:[id number] DOB: 1924/10/28 DOA: 01/14/2024 DOS: the patient was seen and examined on 01/14/2024 PCP: Claudene Lacks, MD  Patient coming from: SNF  Chief Complaint: No chief complaint on file.  HPI: Kristen Pham is a 89 y.o. female with medical history significant of osteoarthritis, essential hypertension, diastolic CHF, depression, peripheral vascular disease who was brought in from skilled facility with altered mental status.  Patient was seen by staff after by fax trouble waking her up due to confusion.  She was lethargic.  When EMS arrived patient was arousable.  Patient has had recurrent UTIs.  Daughter came in and said it to get patient to the ER.  Initially a code stroke was called for.  Workup so far negative.  Blood pressure stabilized.  All other workup  negative but patient was found to be hypothermic requiring a Lawyer.  She also has some transient sinus bradycardia.  At this point suspected sepsis of unknown source. Has mildly elevated troponins and AKI.  Patient being admitted with acute metabolic encephalopathy probably secondary to hypothymia and possible hypoglycemia. Patient has chronic left foot ulcer between 2nd toe and 3rd toe.  Review of Systems: As mentioned in the history of present illness. All other systems reviewed and are negative. Past Medical History:  Diagnosis Date   Anemia    Carpal tunnel syndrome    CHF (congestive heart failure) (HCC)    Depression    Hypertension    Osteoarthritis    PVD (peripheral vascular disease)    Past Surgical History:  Procedure Laterality Date   ABDOMINAL HYSTERECTOMY     CESAREAN SECTION     HAND SURGERY     bilateral release    TONSILECTOMY, ADENOIDECTOMY, BILATERAL MYRINGOTOMY AND TUBES     Social History:  reports that she has never smoked. She has never used smokeless tobacco. She reports current alcohol use. She reports that she does not use  drugs.  Allergies[1]  Family History  Problem Relation Age of Onset   Cerebral aneurysm Father     Prior to Admission medications  Medication Sig Start Date End Date Taking? Authorizing Provider  acetaminophen  (TYLENOL ) 650 MG CR tablet Take 650 mg by mouth at bedtime.    [provider]  atorvastatin  (LIPITOR) 20 MG tablet TAKE 1 TABLET BY MOUTH EVERY DAY 06/02/23   Darron Deatrice LABOR, MD  CALCIUM  CITRATE PO Take 1 tablet by mouth daily.    [provider]  carvedilol  (COREG ) 6.25 MG tablet Take 1 tablet (6.25 mg total) by mouth 2 (two) times daily. 04/20/23   Darron Deatrice LABOR, MD  diclofenac  Sodium (VOLTAREN ) 1 % GEL Apply 2 g topically 4 (four) times daily. Apply to right shoulder. Patient taking differently: Apply 2 g topically daily as needed (pain). Apply to right shoulder. 12/22/22   Arrien, Elidia Sieving, MD  doxycycline  (VIBRA -TABS) 100 MG tablet Take 1 tablet (100 mg total) by mouth 2 (two) times daily. 10/06/23   Valdemar Rocky SAUNDERS, NP  furosemide  (LASIX ) 20 MG tablet Take 1 tablet (20 mg total) by mouth daily as needed for fluid or edema. 12/22/22   Arrien, Mauricio Daniel, MD  gabapentin  (NEURONTIN ) 100 MG capsule TAKE ONE CAPSULE BY MOUTH TWO OR THREE TIMES DAILY AS DIRECTED 07/29/23   Darron Deatrice LABOR, MD  hydrALAZINE  (APRESOLINE ) 25 MG tablet Take 1 tablet (25 mg total) by mouth 3 (three) times daily as needed (As needed for a systolic over  200.). Recheck blood pressure 1 hour after administration of PRN hydralazine . 1.If systolic BP remains > 200 mmHg at recheck: Repeat hydralazine  dose is permitted after 1 hour of initial dose 2.If systolic BP remains > 200 mmHg after 3 hours from initial PRN hydralazine  administration Call EMS 01/10/24 04/09/24  Darron Deatrice LABOR, MD  ibuprofen (ADVIL) 200 MG tablet Take 200 mg by mouth daily.    [provider]  irbesartan  (AVAPRO ) 75 MG tablet Take 1 tablet (75 mg total) by mouth 2 (two) times daily. 10/21/23   Darron Deatrice LABOR, MD  mirtazapine  (REMERON ) 15 MG tablet Take 15 mg by mouth at bedtime.    [provider]  naproxen  sodium (ALEVE ) 220 MG tablet Take 220 mg by mouth daily as needed (pain).    [provider]    Physical Exam: Vitals:   01/14/24 1500 01/14/24 1707 01/14/24 1716 01/14/24 1800  BP: 130/83   (!) 123/56  Pulse: 60   (!) 51  Resp: 12   11  Temp:  (!) 95 F (35 C) (!) 95 F (35 C)   TempSrc:  Rectal Rectal   SpO2: 100%   100%   Constitutional: Acutely ill looking, confused, lethargic, NAD, calm, comfortable Eyes: PERRL, lids and conjunctivae normal ENMT: Mucous membranes are dry posterior pharynx clear of any exudate or lesions.Normal dentition.  Neck: normal, supple, no masses, no thyromegaly Respiratory: clear to auscultation bilaterally, no wheezing, no crackles. Normal respiratory effort. No accessory muscle use.  Cardiovascular: Regular rate and rhythm, no murmurs / rubs / gallops. No extremity edema. 2+ pedal pulses. No carotid bruits.  Abdomen: no tenderness, no masses palpated. No hepatosplenomegaly. Bowel sounds positive.  Musculoskeletal: Good range of motion, no joint swelling or tenderness, Skin: no rashes, lesions, ulcers. No induration Neurologic: CN 2-12 grossly intact. Sensation intact, DTR normal. Strength 5/5 in all 4.  Psychiatric: Lethargic, no agitation  Data Reviewed:  Temperature 95.  Pulse over 51 blood pressure 123/56, white count 5.9 hemoglobin 11.7 BUN 25 creatinine 1.25.Glucose is 112 creatinine 1.28 BUN 25.  Initial troponin 57 then 48.  Acute viral screen is negative for flu RSV and COVID-19.  Urinalysis is negative.  Chest x-ray showed no active disease.  Head CT without contrast showed no acute findings.  Assessment and Plan:  #1 acute metabolic encephalopathy: No clear cause identified.  Patient is hypothermic.  Could be early sepsis but no source.  Patient could also be having some hormonal changes especially need to check  her thyroid  function.  Will continue treatment for underlying causes.  #2 hypothymia: Patient on Bair hugger.  Workup will continue.  Check thyroid  function.  #3 transient sinus bradycardia: May be related to hypothymia.  Appears stable.  Patient's heart rate is within normal now.  #4 generalized weakness: Continue with PT and OT.  #5 chronic osteoarthritis: Stable at baseline right now.  Continue monitoring.  #6 essential hypertension: Will resume home regimen.  Keep blood pressure controlled.  #7 Chronic diastolic heart failure: Compensated.  #8 Chronic left foot wound: No evidence of infection. Will get wound care consult.    Advance Care Planning:   Code Status: Full Code   Consults: None  Family Communication: Daughter at bedside  Severity of Illness: The appropriate patient status for this patient is INPATIENT. Inpatient status is judged to be reasonable and necessary in order to provide the required intensity of service to ensure the patient's safety. The patient's presenting symptoms, physical exam findings, and initial  radiographic and laboratory data in the context of their chronic comorbidities is felt to place them at high risk for further clinical deterioration. Furthermore, it is not anticipated that the patient will be medically stable for discharge from the hospital within 2 midnights of admission.   * I certify that at the point of admission it is my clinical judgment that the patient will require inpatient hospital care spanning beyond 2 midnights from the point of admission due to high intensity of service, high risk for further deterioration and high frequency of surveillance required.*  AuthorBETHA SIM KNOLL, MD 01/14/2024 6:14 PM  For on call review [email or web address].      [1]  Allergies Allergen Reactions   Ivp Dye [Iodinated Contrast Media] Other (See Comments)    Confusion  Disorientation   "

## 2024-01-14 NOTE — ED Notes (Signed)
 Pt on bair hugger and receiving food and drink

## 2024-01-14 NOTE — ED Triage Notes (Signed)
 Pt BIB [facility] from [facility].  Staff checked on after breakfast and had trouble waking her.  A&O x 4, lethargic but arouseable for EMS. Hx of similar episodes.  Recent sample sent but no confirmed UTI or symptoms of same.  Daughter will be at bedside.  Negative stroke screen for EMS.  HTN at baseline even after BP meds. Follows commands.  180/100 HR 60, 98% RA, CBG 162

## 2024-01-14 NOTE — ED Provider Notes (Signed)
 " [place] [facility] Provider Note   CSN: [id number] Arrival date & time: 01/14/24  1211     Patient presents with: No chief complaint on file.   Kristen Pham is a 89 y.o. female.  She has a history of hypertension CHF peripheral vascular disease.  She is currently at a nursing facility.  She was noted by staff to be less arousable and having trouble walking after having breakfast this morning.  Reportedly staff was concerned about a UTI recently and they have sent a sample but no report of any results.  Patient herself endorses some right shoulder pain.  She said she feels very weak and tired.  She does tell me that shoulder pain is not new.  Denies any headache chest pain shortness of breath abdominal pain vomiting diarrhea or urinary symptoms.  She said she uses a walker to ambulate.  Denies any fevers.   The history is provided by the patient.  Weakness Severity:  Moderate Onset quality:  Sudden Timing:  Constant Progression:  Unchanged Relieved by:  None tried Worsened by:  Nothing Ineffective treatments:  None tried Associated symptoms: difficulty walking   Associated symptoms: no abdominal pain, no cough, no fever, no headaches, no nausea and no vomiting        Prior to Admission medications  Medication Sig Start Date End Date Taking? Authorizing Provider  acetaminophen  (TYLENOL ) 650 MG CR tablet Take 650 mg by mouth at bedtime.    [provider]  atorvastatin  (LIPITOR) 20 MG tablet TAKE 1 TABLET BY MOUTH EVERY DAY 06/02/23   Darron Deatrice LABOR, MD  CALCIUM  CITRATE PO Take 1 tablet by mouth daily.    [provider]  carvedilol  (COREG ) 6.25 MG tablet Take 1 tablet (6.25 mg total) by mouth 2 (two) times daily. 04/20/23   Darron Deatrice LABOR, MD  diclofenac  Sodium (VOLTAREN ) 1 % GEL Apply 2 g topically 4 (four) times daily. Apply to right shoulder. Patient taking differently: Apply 2 g topically daily as needed (pain).  Apply to right shoulder. 12/22/22   Arrien, Elidia Sieving, MD  doxycycline  (VIBRA -TABS) 100 MG tablet Take 1 tablet (100 mg total) by mouth 2 (two) times daily. 10/06/23   Zamora, Erin R, NP  furosemide  (LASIX ) 20 MG tablet Take 1 tablet (20 mg total) by mouth daily as needed for fluid or edema. 12/22/22   Arrien, Mauricio Daniel, MD  gabapentin  (NEURONTIN ) 100 MG capsule TAKE ONE CAPSULE BY MOUTH TWO OR THREE TIMES DAILY AS DIRECTED 07/29/23   Darron Deatrice LABOR, MD  hydrALAZINE  (APRESOLINE ) 25 MG tablet Take 1 tablet (25 mg total) by mouth 3 (three) times daily as needed (As needed for a systolic over 200.). Recheck blood pressure 1 hour after administration of PRN hydralazine . 1.If systolic BP remains > 200 mmHg at recheck: Repeat hydralazine  dose is permitted after 1 hour of initial dose 2.If systolic BP remains > 200 mmHg after 3 hours from initial PRN hydralazine  administration Call EMS 01/10/24 04/09/24  Darron Deatrice LABOR, MD  ibuprofen (ADVIL) 200 MG tablet Take 200 mg by mouth daily.    [provider]  irbesartan  (AVAPRO ) 75 MG tablet Take 1 tablet (75 mg total) by mouth 2 (two) times daily. 10/21/23   Darron Deatrice LABOR, MD  mirtazapine  (REMERON ) 15 MG tablet Take 15 mg by mouth at bedtime.    [provider]  naproxen  sodium (ALEVE ) 220 MG tablet Take 220 mg by mouth daily as needed (pain).  [provider]    Allergies: Ivp dye [iodinated contrast media]    Review of Systems  Constitutional:  Negative for fever.  Respiratory:  Negative for cough.   Gastrointestinal:  Negative for abdominal pain, nausea and vomiting.  Neurological:  Positive for weakness. Negative for headaches.    Updated Vital Signs BP 130/83   Pulse 60   Temp 97.9 F (36.6 C) (Oral)   Resp 12   SpO2 100%   Physical Exam Vitals and nursing note reviewed.  Constitutional:      General: She is not in acute distress.    Appearance: Normal appearance. She is well-developed.  HENT:      Head: Normocephalic and atraumatic.  Eyes:     Conjunctiva/sclera: Conjunctivae normal.  Cardiovascular:     Rate and Rhythm: Normal rate and regular rhythm.     Heart sounds: Murmur heard.  Pulmonary:     Effort: Pulmonary effort is normal. No respiratory distress.     Breath sounds: Normal breath sounds. No stridor. No wheezing.  Abdominal:     Palpations: Abdomen is soft.     Tenderness: There is no abdominal tenderness. There is no guarding or rebound.  Musculoskeletal:     Cervical back: Neck supple.  Skin:    General: Skin is warm and dry.  Neurological:     General: No focal deficit present.     Mental Status: She is alert.     GCS: GCS eye subscore is 4. GCS verbal subscore is 5. GCS motor subscore is 6.     Motor: No weakness.     (all labs ordered are listed, but only abnormal results are displayed) Labs Reviewed  COMPREHENSIVE METABOLIC PANEL WITH GFR - Abnormal; Notable for the following components:      Result Value   Glucose, Bld 112 (*)    BUN 25 (*)    Creatinine, Ser 1.28 (*)    GFR, Estimated 38 (*)    All other components within normal limits  CBC WITH DIFFERENTIAL/PLATELET - Abnormal; Notable for the following components:   Hemoglobin 11.7 (*)    All other components within normal limits  TROPONIN T, HIGH SENSITIVITY - Abnormal; Notable for the following components:   Troponin T High Sensitivity 57 (*)    All other components within normal limits  TROPONIN T, HIGH SENSITIVITY - Abnormal; Notable for the following components:   Troponin T High Sensitivity 48 (*)    All other components within normal limits  RESP PANEL BY RT-PCR (RSV, FLU A&B, COVID)  RVPGX2  CULTURE, BLOOD (ROUTINE X 2)  URINALYSIS, ROUTINE W REFLEX MICROSCOPIC  TSH  AMMONIA  I-STAT CG4 LACTIC ACID, ED  I-STAT CG4 LACTIC ACID, ED    EKG: None  Radiology: CT Head Wo Contrast Result Date: 01/14/2024 EXAM: CT HEAD WITHOUT CONTRAST 01/14/2024 03:55:22 PM TECHNIQUE: CT of the  head was performed without the administration of intravenous contrast. Automated exposure control, iterative reconstruction, and/or weight based adjustment of the mA/kV was utilized to reduce the radiation dose to as low as reasonably achievable. COMPARISON: 05/04/2023 CLINICAL HISTORY: Mental status change, unknown cause FINDINGS: BRAIN AND VENTRICLES: No acute hemorrhage. No evidence of acute infarct. No hydrocephalus. No extra-axial collection. No mass effect or midline shift. Moderate chronic microvascular ischemic change. Bilateral basal ganglia mineralization. Generalized volume loss. ORBITS: Bilateral lens replacement. SINUSES: Chronic right maxillary sinusitis with mucosal thickening and osseous changes. Sphenoid sinus mucosal thickening. SOFT TISSUES AND SKULL: No acute soft tissue abnormality.  No skull fracture. IMPRESSION: 1. No acute intracranial abnormality. 2. Moderate chronic microvascular ischemic change and generalized volume loss. Electronically signed by: Donnice Mania MD 01/14/2024 04:01 PM EST RP Workstation: [id number]   DG Chest Port 1 View Result Date: 01/14/2024 CLINICAL DATA:  Weakness EXAM: PORTABLE CHEST 1 VIEW COMPARISON:  May 04, 2023 FINDINGS: The heart size and mediastinal contours are within normal limits. Both lungs are clear. Severe degenerative changes seen involving the right glenohumeral joint. IMPRESSION: No active disease. Electronically Signed   By: Lynwood Landy Raddle M.D.   On: 01/14/2024 13:45     Procedures   Medications Ordered in the ED  sodium chloride  0.9 % bolus 1,000 mL (1,000 mLs Intravenous New Bag/Given 01/14/24 1307)    Clinical Course as of 01/14/24 1606  Fri Jan 14, 2024  1231 Last EF was 55 to 60% back in 2024 [MB]  1251 Daughter is here now.  Patient has a couple of falls in the past.  Did hit her head.  She has also had some less responsive episodes but that is usually in response to low blood pressures when they are trying to manage her high  blood pressure.  She has some chronic ulcerations on her feet. [MB]  1309 Chest x-ray interpreted by me as no acute infiltrate.  Awaiting radiology reading. [MB]  1605 Daughter said if no significant findings on workup she is comfortable with her returning back to her facility. [MB]    Clinical Course User Index [MB] Towana Ozell BROCKS, MD                                 Medical Decision Making Amount and/or Complexity of Data Reviewed Labs: ordered. Radiology: ordered.   This patient complains of general weakness being tired; this involves an extensive number of treatment Options and is a complaint that carries with it a high risk of complications and morbidity. The differential includes lethargy, sepsis, infection, dehydration, metabolic derangement  I ordered, reviewed and interpreted labs, which included CBC with normal white count stable hemoglobin, chemistries mild elevation of creatinine, COVID flu negative, troponins mildly elevated but flat, lactate normal, urinalysis negative I ordered medication IV fluids and reviewed PMP when indicated. I ordered imaging studies which included chest x-ray head CT and I independently    visualized and interpreted imaging which showed no acute findings Additional history obtained from patient's daughter Previous records obtained and reviewed in epic including outpatient clinic notes Cardiac monitoring reviewed, normal sinus rhythm Social determinants considered, no significant barriers Critical Interventions: None  After the interventions stated above, I reevaluated the patient and found tired but arousable to voice and denying any specific complaints Admission and further testing considered, her care is signed out to Dr. Lenor to follow-up on second troponin and urinalysis.  Family feel can return back to facility if no clear abnormal findings that would need admission.      Final diagnoses:  Lethargy    ED Discharge Orders     None           Towana Ozell BROCKS, MD 01/14/24 1701  "

## 2024-01-14 NOTE — ED Provider Notes (Signed)
 Care was taken over from Dr. Towana.  Patient presented with altered mental status.  Workup so far has been unremarkable.  Initially the plan was to discharge back to the assisted living facility.  However when I went back to assess the patient, she was very sleepy.  The family at bedside says that she has been like this all day and they been having a hard time keeping her awake.  We checked her rectal temp and it was 95.  Will place her on the [employer]  Will check a TSH, ammonia and VBG.  Her white count is normal.  Her lactic acid is normal.  Chest x-ray does not show any evidence of pneumonia.  Head CT is negative.  Will plan admission.  Discussed with Dr. Sim Ness, Andrea, MD 01/14/24 [phone]

## 2024-01-14 NOTE — ED Notes (Addendum)
 IN and out attempted.  Pt anatomy very small, unable to determine appropriate meatus, for successful cath.  Purewick placed.

## 2024-01-15 DIAGNOSIS — G9341 Metabolic encephalopathy: Secondary | ICD-10-CM | POA: Diagnosis not present

## 2024-01-15 DIAGNOSIS — I480 Paroxysmal atrial fibrillation: Secondary | ICD-10-CM | POA: Diagnosis not present

## 2024-01-15 DIAGNOSIS — L97521 Non-pressure chronic ulcer of other part of left foot limited to breakdown of skin: Secondary | ICD-10-CM | POA: Diagnosis not present

## 2024-01-15 LAB — CREATININE, SERUM
Creatinine, Ser: 0.99 mg/dL (ref 0.44–1.00)
GFR, Estimated: 52 mL/min — ABNORMAL LOW

## 2024-01-15 LAB — CBC
HCT: 32.2 % — ABNORMAL LOW (ref 36.0–46.0)
HCT: 34.5 % — ABNORMAL LOW (ref 36.0–46.0)
Hemoglobin: 10.5 g/dL — ABNORMAL LOW (ref 12.0–15.0)
Hemoglobin: 11.2 g/dL — ABNORMAL LOW (ref 12.0–15.0)
MCH: 29.9 pg (ref 26.0–34.0)
MCH: 29.7 pg (ref 26.0–34.0)
MCHC: 32.6 g/dL (ref 30.0–36.0)
MCHC: 32.5 g/dL (ref 30.0–36.0)
MCV: 91.7 fL (ref 80.0–100.0)
MCV: 91.5 fL (ref 80.0–100.0)
Platelets: 223 K/uL (ref 150–400)
Platelets: 218 K/uL (ref 150–400)
RBC: 3.51 MIL/uL — ABNORMAL LOW (ref 3.87–5.11)
RBC: 3.77 MIL/uL — ABNORMAL LOW (ref 3.87–5.11)
RDW: 14.3 % (ref 11.5–15.5)
RDW: 14.4 % (ref 11.5–15.5)
WBC: 6 K/uL (ref 4.0–10.5)
WBC: 7.6 K/uL (ref 4.0–10.5)
nRBC: 0 % (ref 0.0–0.2)
nRBC: 0 % (ref 0.0–0.2)

## 2024-01-15 LAB — COMPREHENSIVE METABOLIC PANEL WITH GFR
ALT: 14 U/L (ref 0–44)
AST: 24 U/L (ref 15–41)
Albumin: 3.4 g/dL — ABNORMAL LOW (ref 3.5–5.0)
Alkaline Phosphatase: 110 U/L (ref 38–126)
Anion gap: 12 (ref 5–15)
BUN: 19 mg/dL (ref 8–23)
CO2: 22 mmol/L (ref 22–32)
Calcium: 8.6 mg/dL — ABNORMAL LOW (ref 8.9–10.3)
Chloride: 107 mmol/L (ref 98–111)
Creatinine, Ser: 0.9 mg/dL (ref 0.44–1.00)
GFR, Estimated: 58 mL/min — ABNORMAL LOW
Glucose, Bld: 73 mg/dL (ref 70–99)
Potassium: 3.8 mmol/L (ref 3.5–5.1)
Sodium: 141 mmol/L (ref 135–145)
Total Bilirubin: 0.7 mg/dL (ref 0.0–1.2)
Total Protein: 6.7 g/dL (ref 6.5–8.1)

## 2024-01-15 LAB — GLUCOSE, CAPILLARY: Glucose-Capillary: 141 mg/dL — ABNORMAL HIGH (ref 70–99)

## 2024-01-15 LAB — T4, FREE: Free T4: 0.98 ng/dL (ref 0.80–2.00)

## 2024-01-15 MED ORDER — ACETAMINOPHEN 325 MG PO TABS
650.0000 mg | ORAL_TABLET | Freq: Every day | ORAL | Status: DC
  Filled 2024-01-15: qty 2

## 2024-01-15 MED ORDER — MIRTAZAPINE 15 MG PO TABS
15.0000 mg | ORAL_TABLET | Freq: Every day | ORAL | Status: DC
  Administered 2024-01-15: 15 mg via ORAL
  Filled 2024-01-15: qty 1

## 2024-01-15 MED ORDER — ONDANSETRON HCL 4 MG/2ML IJ SOLN: 4.0000 mg | Freq: Four times a day (QID) | INTRAMUSCULAR | Status: DC | PRN

## 2024-01-15 MED ORDER — ENOXAPARIN SODIUM 60 MG/0.6ML IJ SOSY
50.0000 mg | PREFILLED_SYRINGE | Freq: Every day | INTRAMUSCULAR | Status: DC
  Administered 2024-01-15: 50 mg via SUBCUTANEOUS
  Filled 2024-01-15: qty 0.6

## 2024-01-15 MED ORDER — DOCUSATE SODIUM 100 MG PO CAPS
100.0000 mg | ORAL_CAPSULE | Freq: Two times a day (BID) | ORAL | Status: DC | PRN
  Filled 2024-01-15: qty 1

## 2024-01-15 MED ORDER — DEXTROSE IN LACTATED RINGERS 5 % IV SOLN: INTRAVENOUS | Status: DC

## 2024-01-15 MED ORDER — ONDANSETRON HCL 4 MG PO TABS: 4.0000 mg | ORAL_TABLET | Freq: Four times a day (QID) | ORAL | Status: DC | PRN

## 2024-01-15 MED ORDER — HYDRALAZINE HCL 25 MG PO TABS: 25.0000 mg | ORAL_TABLET | Freq: Three times a day (TID) | ORAL | Status: DC | PRN

## 2024-01-15 MED ORDER — TRAMADOL HCL 50 MG PO TABS: 50.0000 mg | ORAL_TABLET | Freq: Three times a day (TID) | ORAL | Status: DC | PRN

## 2024-01-15 MED ORDER — HALOPERIDOL LACTATE 5 MG/ML IJ SOLN: 1.0000 mg | Freq: Four times a day (QID) | INTRAMUSCULAR | Status: DC | PRN

## 2024-01-15 MED ORDER — ATORVASTATIN CALCIUM 10 MG PO TABS
20.0000 mg | ORAL_TABLET | Freq: Every day | ORAL | Status: DC
  Administered 2024-01-15: 20 mg via ORAL
  Filled 2024-01-15: qty 2

## 2024-01-15 MED ORDER — IRBESARTAN 75 MG PO TABS
75.0000 mg | ORAL_TABLET | Freq: Two times a day (BID) | ORAL | Status: DC
  Administered 2024-01-15 – 2024-01-16 (×2): 75 mg via ORAL
  Filled 2024-01-15 (×2): qty 1

## 2024-01-15 MED ORDER — ENOXAPARIN SODIUM 30 MG/0.3ML IJ SOSY: 30.0000 mg | PREFILLED_SYRINGE | Freq: Every day | INTRAMUSCULAR | Status: DC

## 2024-01-15 MED ORDER — GABAPENTIN 100 MG PO CAPS
100.0000 mg | ORAL_CAPSULE | Freq: Two times a day (BID) | ORAL | Status: DC
  Administered 2024-01-15 – 2024-01-16 (×2): 100 mg via ORAL
  Filled 2024-01-15 (×2): qty 1

## 2024-01-15 MED ORDER — DILTIAZEM HCL-DEXTROSE 125-5 MG/125ML-% IV SOLN (PREMIX)
5.0000 mg/h | INTRAVENOUS | Status: DC
  Administered 2024-01-15: 5 mg/h via INTRAVENOUS
  Filled 2024-01-15: qty 125

## 2024-01-15 MED ORDER — DILTIAZEM LOAD VIA INFUSION
10.0000 mg | Freq: Once | INTRAVENOUS | Status: AC
  Administered 2024-01-15: 10 mg via INTRAVENOUS
  Filled 2024-01-15: qty 10

## 2024-01-15 MED ORDER — NAPROXEN 250 MG PO TABS: 250.0000 mg | ORAL_TABLET | Freq: Two times a day (BID) | ORAL | Status: DC | PRN

## 2024-01-15 MED ORDER — CARVEDILOL 6.25 MG PO TABS
6.2500 mg | ORAL_TABLET | Freq: Two times a day (BID) | ORAL | Status: DC
  Administered 2024-01-15 – 2024-01-16 (×3): 6.25 mg via ORAL
  Filled 2024-01-15: qty 1
  Filled 2024-01-15: qty 2
  Filled 2024-01-15: qty 1

## 2024-01-15 NOTE — Progress Notes (Signed)
 " PROGRESS NOTE    Kristen Pham  FMW:[id number] DOB: 11-Aug-1924 DOA: 01/14/2024 PCP: Claudene Lacks, MD   Brief Narrative:    Kristen Pham is a 89 y.o. female with medical history significant of osteoarthritis, essential hypertension, diastolic CHF, depression, peripheral vascular disease who was brought in from skilled facility with altered mental status.  Patient was seen by staff after by fax trouble waking her up due to confusion.  She was lethargic.  When EMS arrived patient was arousable.  Patient has had recurrent UTIs.  Daughter came in and said it to get patient to the ER.  Initially a code stroke was called for.  Workup so far negative.  Blood pressure stabilized.  All other workup  negative but patient was found to be hypothermic requiring a Lawyer.  She also has some transient sinus bradycardia.  At this point suspected sepsis of unknown source. Has mildly elevated troponins and AKI.  Patient being admitted with acute metabolic encephalopathy probably secondary to hypothymia and possible hypoglycemia. Patient has chronic left foot ulcer between 2nd toe and 3rd toe.   In the ER she was placed on Bair hugger.  Also she became tachycardic with A-fib with RVR which is new.  Fortunately converted with IV Cardizem .  Assessment & Plan:   Principal Problem:   Acute metabolic encephalopathy Active Problems:   Essential hypertension   PAD (peripheral artery disease)   Anemia   Chronic toe pain, left foot   Anxiety with depression   Non-pressure chronic ulcer of other part of left foot limited to breakdown of skin (HCC)   Sensorineural hearing loss (SNHL) of both ears   Hypothermia   Sinus bradycardia   Weakness generalized   1 acute metabolic encephalopathy: No clear cause identified.    - Patient was hypothermic, placed on Bair, temperature improved.   -Head CT negative - Mental status returned to baseline.  Daughter at the bedside - Blood culture taken in the ER, no  growth so far  #2 hypothermia: Resolved.  Thyroid  function normal.  No clear signs symptoms of infection.  He has wound on left second interdigital cleft but does not appear to be grossly infected.   #3 transient sinus bradycardia: Initially mild bradycardia however later flipped into A-fib RVR with heart rate in 140s, New diagnosis of A-fib Fortunately converted to sinus with IV Cardizem . Patient did miss her doses of carvedilol  which could be contributing to this. Will wean off Cardizem  drip.  Continue oral carvedilol  Discussed anticoagulation risk/benefit.  Will hold off on anticoagulation given her is/fall risk and short duration of A-fib.  # History of aortic stenosis, MR  #4 generalized weakness: Continue with PT and OT.   #5 chronic osteoarthritis: Stable at baseline right now.  Continue monitoring.   #6 essential hypertension: Will resume home regimen.  Keep blood pressure controlled.   #7 Chronic diastolic heart failure: Compensated.   #8 Chronic left foot wound: No evidence of infection. Will get wound care consult. Low threshold of antibiotics      Advance Care Planning:   Code Status: DNR/DNI  Consults: None   Family Communication: Daughter at bedside.  Daughter would like to limit her hospital stay as much as possible.  Given that she was started on IV Cardizem  drip, would like to monitor for at least 24 hours. possibly discharge tomorrow   Severity of Illness: The appropriate patient status for this patient is INPATIENT. Inpatient status is judged to be reasonable and necessary in order  to provide the required intensity of service to ensure the patient's safety. The patient's presenting symptoms, physical exam findings, and initial radiographic and laboratory data in the context of their chronic comorbidities is felt to place them at high risk for further clinical deterioration. Furthermore, it is not anticipated that the patient will be medically stable for discharge  from the hospital within 2 midnights of admission.    * I certify that at the point of admission it is my clinical judgment that the patient will require inpatient hospital care spanning beyond 2 midnights from the point of admission due to high intensity of service, high risk for further deterioration and high frequency of surveillance required.*   Subjective:  Patient seen and examined at bedside.  She is not in any acute distress.  Vital signs are stable.  Earlier today.  Continues with RVR, quickly returned to normal sinus with IV Cardizem .  Daughter at the bedside would like to limit her hospital stay  Objective: Vitals:   01/15/24 0400 01/15/24 0500 01/15/24 0600 01/15/24 0622  BP: (!) 187/82 (!) 140/91 137/88   Pulse: 92 (!) 113 (!) 124   Resp: 16 14 19    Temp:    98.2 F (36.8 C)  TempSrc:      SpO2: 98% 99% 98%     Intake/Output Summary (Last 24 hours) at 01/15/2024 0753 Last data filed at 01/14/2024 2032 Gross per 24 hour  Intake 1900 ml  Output --  Net 1900 ml   There were no vitals filed for this visit.  Examination:  General: Alert, at baseline per daughter Chest: Clear CVs: S1, S2, + murmur, regular rhythm Abdomen: Soft, nontender Extremities: No edema   Data Reviewed: I have personally reviewed following labs and imaging studies  CBC: Recent Labs  Lab 01/14/24 1234 01/14/24 2036 01/15/24 0530  WBC 5.9 6.0 7.6  NEUTROABS 3.6  --   --   HGB 11.7* 10.5* 11.2*  HCT 37.3 32.2* 34.5*  MCV 94.9 91.7 91.5  PLT 239 223 218   Basic Metabolic Panel: Recent Labs  Lab 01/14/24 1234 01/14/24 2036 01/15/24 0530  NA 139  --  141  K 4.2  --  3.8  CL 103  --  107  CO2 26  --  22  GLUCOSE 112*  --  73  BUN 25*  --  19  CREATININE 1.28* 0.99 0.90  CALCIUM  9.2  --  8.6*   GFR: CrCl cannot be calculated (Unknown ideal weight.). Liver Function Tests: Recent Labs  Lab 01/14/24 1234 01/15/24 0530  AST 24 24  ALT 17 14  ALKPHOS 117 110  BILITOT 0.5  0.7  PROT 7.2 6.7  ALBUMIN 3.8 3.4*   No results for input(s): LIPASE, AMYLASE in the last 168 hours. Recent Labs  Lab 01/14/24 1715  AMMONIA 19   Coagulation Profile: No results for input(s): INR, PROTIME in the last 168 hours. Cardiac Enzymes: No results for input(s): CKTOTAL, CKMB, CKMBINDEX, TROPONINI in the last 168 hours. BNP (last 3 results) No results for input(s): PROBNP in the last 8760 hours. HbA1C: No results for input(s): HGBA1C in the last 72 hours. CBG: Recent Labs  Lab 01/14/24 1713  GLUCAP 71   Lipid Profile: No results for input(s): CHOL, HDL, LDLCALC, TRIG, CHOLHDL, LDLDIRECT in the last 72 hours. Thyroid  Function Tests: Recent Labs    01/14/24 1715  TSH 1.220  FREET4 0.98   Anemia Panel: No results for input(s): VITAMINB12, FOLATE, FERRITIN, TIBC, IRON , RETICCTPCT  in the last 72 hours. Sepsis Labs: Recent Labs  Lab 01/14/24 1244 01/14/24 1539  LATICACIDVEN 1.3 1.1    Recent Results (from the past 240 hours)  Resp panel by RT-PCR (RSV, Flu A&B, Covid)     Status: None   Collection Time: 01/14/24 12:34 PM   Specimen: Nasal Swab  Result Value Ref Range Status   SARS Coronavirus 2 by RT PCR NEGATIVE NEGATIVE Final   Influenza A by PCR NEGATIVE NEGATIVE Final   Influenza B by PCR NEGATIVE NEGATIVE Final    Comment: (NOTE) The Xpert Xpress SARS-CoV-2/FLU/RSV plus assay is intended as an aid in the diagnosis of influenza from Nasopharyngeal swab specimens and should not be used as a sole basis for treatment. Nasal washings and aspirates are unacceptable for Xpert Xpress SARS-CoV-2/FLU/RSV testing.  Fact Sheet for Patients: bloggercourse.com  Fact Sheet for Healthcare Providers: seriousbroker.it  This test is not yet approved or cleared by the [place]  FDA and has been authorized for detection and/or diagnosis of SARS-CoV-2 by FDA under an  Emergency Use Authorization (EUA). This EUA will remain in effect (meaning this test can be used) for the duration of the COVID-19 declaration under Section 564(b)(1) of the Act, 21 U.S.C. section 360bbb-3(b)(1), unless the authorization is terminated or revoked.     Resp Syncytial Virus by PCR NEGATIVE NEGATIVE Final    Comment: (NOTE) Fact Sheet for Patients: bloggercourse.com  Fact Sheet for Healthcare Providers: seriousbroker.it  This test is not yet approved or cleared by the [place]  FDA and has been authorized for detection and/or diagnosis of SARS-CoV-2 by FDA under an Emergency Use Authorization (EUA). This EUA will remain in effect (meaning this test can be used) for the duration of the COVID-19 declaration under Section 564(b)(1) of the Act, 21 U.S.C. section 360bbb-3(b)(1), unless the authorization is terminated or revoked.  Performed at [facility], [address] [place], [place] [address]          Radiology Studies: CT Head Wo Contrast Result Date: 01/14/2024 EXAM: CT HEAD WITHOUT CONTRAST 01/14/2024 03:55:22 PM TECHNIQUE: CT of the head was performed without the administration of intravenous contrast. Automated exposure control, iterative reconstruction, and/or weight based adjustment of the mA/kV was utilized to reduce the radiation dose to as low as reasonably achievable. COMPARISON: 05/04/2023 CLINICAL HISTORY: Mental status change, unknown cause FINDINGS: BRAIN AND VENTRICLES: No acute hemorrhage. No evidence of acute infarct. No hydrocephalus. No extra-axial collection. No mass effect or midline shift. Moderate chronic microvascular ischemic change. Bilateral basal ganglia mineralization. Generalized volume loss. ORBITS: Bilateral lens replacement. SINUSES: Chronic right maxillary sinusitis with mucosal thickening and osseous changes. Sphenoid sinus mucosal thickening. SOFT TISSUES AND SKULL: No  acute soft tissue abnormality. No skull fracture. IMPRESSION: 1. No acute intracranial abnormality. 2. Moderate chronic microvascular ischemic change and generalized volume loss. Electronically signed by: Donnice Mania MD 01/14/2024 04:01 PM EST RP Workstation: [id number]   DG Chest Port 1 View Result Date: 01/14/2024 CLINICAL DATA:  Weakness EXAM: PORTABLE CHEST 1 VIEW COMPARISON:  May 04, 2023 FINDINGS: The heart size and mediastinal contours are within normal limits. Both lungs are clear. Severe degenerative changes seen involving the right glenohumeral joint. IMPRESSION: No active disease. Electronically Signed   By: Lynwood Landy Raddle M.D.   On: 01/14/2024 13:45        Scheduled Meds:  carvedilol   6.25 mg Oral BID WC   enoxaparin  (LOVENOX ) injection  50 mg Subcutaneous Daily   triamcinolone  acetonide  10  mg Other Once   Continuous Infusions:  dextrose  5% lactated ringers      diltiazem  (CARDIZEM ) infusion 5 mg/hr (01/15/24 0745)          Kristen Winthrop, MD [facility] 01/15/2024, 7:53 AM   "

## 2024-01-15 NOTE — Consult Note (Signed)
 WOC Nurse Consult Note: Reason for Consult: 2nd toe wound Abrasions on the LLE Wound type: Chronic non healing wound between the great toe and 2nd toe -left foot-arterial Followed by vascular surgery Trauma; LLE; partial thickness  Pressure Injury POA: NA Measurement: see nursing flow sheet Wound bed: Toe wound: pale, non healing Abrasion: intact skin Drainage (amount, consistency, odor) see nursing flow sheets Periwound: macerated toe wound Dressing procedure/placement/frequency: Cleanse toe wound with Vashe [name] # [phone]), paint with betadine, allow to air dry. Cover if desired with foam or bandaid  Paint LLE abrasions with betadine, open to air.    Re consult if needed, will not follow at this time. Thanks  Tylon [employer], RN,CWOCN, CNS, [employer] [phone]

## 2024-01-15 NOTE — Plan of Care (Signed)

## 2024-01-15 NOTE — ED Notes (Signed)
 Hospitalist was sent an echat in regards to patient experincing increased anxiety and heart rhythm of A Fib WITH RVR

## 2024-01-15 NOTE — ED Notes (Signed)
 Another message was sen to the Hospitalist d/t in regards to pt being restless and change in rhythm.

## 2024-01-16 ENCOUNTER — Other Ambulatory Visit (HOSPITAL_COMMUNITY): Payer: Self-pay

## 2024-01-16 DIAGNOSIS — G9341 Metabolic encephalopathy: Secondary | ICD-10-CM | POA: Diagnosis not present

## 2024-01-16 MED ORDER — HYDRALAZINE HCL 25 MG PO TABS: 25.0000 mg | ORAL_TABLET | Freq: Three times a day (TID) | ORAL | Status: DC

## 2024-01-16 MED ORDER — LABETALOL HCL 5 MG/ML IV SOLN: 10.0000 mg | INTRAVENOUS | Status: DC | PRN

## 2024-01-16 NOTE — Evaluation (Signed)
 Physical Therapy Evaluation Patient Details Name: Kristen Pham MRN: [id number] DOB: 1924/03/15 Today's Date: 01/16/2024  History of Present Illness  89 y/o F presenting to ED from [facility] on 12/26 with AMS; admitted for acute metabolic enceohalopathy, head CT negative. Also noted to have transient bradycardia and new A fib dx.    PMH includes OA, essential HTN, CHF, depression, PVD, carpal tunnel syndrome   Clinical Impression  Pt admitted from ALF where she had assist for bed mobility and to stand in the morning. Pt was then able to ambulate in the room with RW with occasional need for WC due to foot pain. Pt presents below mobility baseline with ModA-ModAx2 for bed mobility. Pt was able to stand with ModA-MaxAx2 and take side-steps towards [facility] with use of RW. Posterior and left lateral lean throughout as it appeared pt was off-loading her R LE. Discussed with daughter that to return home safely, she would need assist for all mobility and would likely be using a WC. Also discussed recommendation for PTAR transportation for safety with daughter verbalizing understanding. Recommending HHPT at d/c given ALF staff can provide level of assist needed.         If plan is discharge home, recommend the following: A lot of help with walking and/or transfers;A lot of help with bathing/dressing/bathroom;Assist for transportation;Help with stairs or ramp for entrance;Assistance with cooking/housework   Can travel by private vehicle    No, recommending PTAR. Would need +2 to pivot from [facility] to personal vehicle    Equipment Recommendations None recommended by PT     Functional Status Assessment Patient has had a recent decline in their functional status and demonstrates the ability to make significant improvements in function in a reasonable and predictable amount of time.     Precautions / Restrictions Precautions Precautions: Fall Recall of Precautions/Restrictions:  Intact Restrictions Weight Bearing Restrictions Per Provider Order: No      Mobility  Bed Mobility Overal bed mobility: Needs Assistance Bed Mobility: Supine to Sit, Sit to Supine    Supine to sit: Mod assist Sit to supine: Mod assist, +2 for physical assistance   General bed mobility comments: able to bring LE's towards EOB with assist to complete movement and raise trunk. ModAx2 for return to supine for trunk and LE management    Transfers Overall transfer level: Needs assistance Equipment used: Rolling walker (2 wheels) Transfers: Sit to/from Stand Sit to Stand: Mod assist, Max assist, +2 physical assistance    General transfer comment: leaning to L and posteriorly to off-load R LE. Able to step towards [facility] with assist to shift L LE.    Ambulation/Gait  General Gait Details: unable this date    Balance Overall balance assessment: Needs assistance Sitting-balance support: Feet supported Sitting balance-Leahy Scale: Fair   Postural control: Posterior lean, Left lateral lean Standing balance support: Bilateral upper extremity supported, During functional activity, Reliant on assistive device for balance Standing balance-Leahy Scale: Poor       Pertinent Vitals/Pain Pain Assessment Pain Assessment: Faces Faces Pain Scale: Hurts little more Pain Location: R foot Pain Descriptors / Indicators: Discomfort Pain Intervention(s): Limited activity within patient's tolerance, Monitored during session, Repositioned    Home Living Family/patient expects to be discharged to:: Assisted living    Additional Comments: gets PT/OT at [facility]    Prior Function Prior Level of Function : Needs assist    Mobility Comments: Has assist for bed mobility and to stand upon waking up. Is then normally ModI  with RW in the room. Ambulates to dining hall 3-4 meals/week, takes w/c depending on how her foot feels ADLs Comments: assist for bathing, dressing, pericare     Extremity/Trunk  Assessment   Upper Extremity Assessment Upper Extremity Assessment: Defer to OT evaluation    Lower Extremity Assessment Lower Extremity Assessment: Generalized weakness (chronic L foot wound)    Cervical / Trunk Assessment Cervical / Trunk Assessment: Kyphotic  Communication   Communication Communication: No apparent difficulties    Cognition Arousal: Alert Behavior During Therapy: WFL for tasks assessed/performed   PT - Cognitive impairments: Sequencing, Problem solving, Safety/Judgement    Following commands: Intact       Cueing Cueing Techniques: Verbal cues     General Comments General comments (skin integrity, edema, etc.): VSS on RA     PT Assessment Patient needs continued PT services  PT Problem List Decreased strength;Decreased activity tolerance;Decreased balance;Decreased mobility;Decreased knowledge of use of DME;Decreased safety awareness       PT Treatment Interventions DME instruction;Gait training;Functional mobility training;Therapeutic activities;Therapeutic exercise;Balance training;Neuromuscular re-education;Patient/family education;Wheelchair mobility training    PT Goals (Current goals can be found in the Care Plan section)  Acute Rehab PT Goals Patient Stated Goal: to work with physical therapy PT Goal Formulation: With patient/family Time For Goal Achievement: 01/30/24 Potential to Achieve Goals: Good    Frequency Min 2X/week     Co-evaluation   Reason for Co-Treatment: Complexity of the patient's impairments (multi-system involvement);Necessary to address cognition/behavior during functional activity;To address functional/ADL transfers   OT goals addressed during session: ADL's and self-care;Strengthening/ROM       AM-PAC PT 6 Clicks Mobility  Outcome Measure Help needed turning from your back to your side while in a flat bed without using bedrails?: A Lot Help needed moving from lying on your back to sitting on the side of a  flat bed without using bedrails?: A Lot Help needed moving to and from a bed to a chair (including a wheelchair)?: Total Help needed standing up from a chair using your arms (e.g., wheelchair or bedside chair)?: Total Help needed to walk in hospital room?: Total Help needed climbing 3-5 steps with a railing? : Total 6 Click Score: 8    End of Session Equipment Utilized During Treatment: Gait belt Activity Tolerance: Patient tolerated treatment well Patient left: in bed;with call bell/phone within reach;with bed alarm set;with family/visitor present Nurse Communication: Mobility status;Other (comment) (need for PTAR) PT Visit Diagnosis: Unsteadiness on feet (R26.81);Other abnormalities of gait and mobility (R26.89);Muscle weakness (generalized) (M62.81);History of falling (Z91.81)    Time: 8964-8945 PT Time Calculation (min) (ACUTE ONLY): 19 min   Charges:   PT Evaluation $PT Eval Low Complexity: 1 Low   PT General Charges $$ ACUTE PT VISIT: 1 Visit    Kate ORN, PT, DPT Secure Chat Preferred  Rehab Office [phone]   Kate BRAVO Wendolyn 01/16/2024, 11:21 AM

## 2024-01-16 NOTE — TOC Initial Note (Signed)
 Transition of Care [facility]) - Initial/Assessment Note    Patient Details  Name: Kristen Pham MRN: [id number] Date of Birth: 1924/09/20  Transition of Care [facility]) CM/SW Contact:    Olam FORBES Ally, LCSW Phone Number: 01/16/2024, 12:18 PM  Clinical Narrative:                    CSW was alerted that pt was medically ready for discharge to return to [facility]. CSW called pt's daughter and confirmed that pt was a resident of their ALF. Pt's daughter confirmed that she would transport pt back to facility.  CSW spoke with Orland from [facility] and she confirmed that pt is a ALF resident. Orland confirmed that pt would need DC summary to return which could be brought by daughter. CSW informed Orland that HHPT is being recommended.  CSW informed Orland of need DC summary that is to be brought with her to the facility.  TOC team will continue to assist with discharge planning needs.      Patient Goals and CMS Choice            Expected Discharge Plan and Services         Expected Discharge Date: 01/16/24                                    Prior Living Arrangements/Services                       Activities of Daily Living   ADL Screening (condition at time of admission) Independently performs ADLs?: No Does the patient have a NEW difficulty with bathing/dressing/toileting/self-feeding that is expected to last >3 days?: Yes (Initiates electronic notice to provider for possible OT consult) Does the patient have a NEW difficulty with getting in/out of bed, walking, or climbing stairs that is expected to last >3 days?: Yes (Initiates electronic notice to provider for possible PT consult) Does the patient have a NEW difficulty with communication that is expected to last >3 days?: No Is the patient deaf or have difficulty hearing?: Yes Does the patient have difficulty seeing, even when wearing glasses/contacts?: No Does the patient have difficulty concentrating, remembering,  or making decisions?: Yes  Permission Sought/Granted                  Emotional Assessment              Admission diagnosis:  Lethargy [R53.83] Weakness generalized [R53.1] Hypothermia, initial encounter [T68.XXXA] Acute metabolic encephalopathy [G93.41] Patient Active Problem List   Diagnosis Date Noted   Hypothermia 01/14/2024   Sinus bradycardia 01/14/2024   Acute metabolic encephalopathy 01/14/2024   Weakness generalized 01/14/2024   Essential hypertension 12/18/2022   Anxiety with depression 12/18/2022   Anemia 12/18/2022   Acute on chronic diastolic CHF (congestive heart failure) (HCC) 12/18/2022   Acute respiratory failure (HCC) 12/18/2022   PAD (peripheral artery disease) 04/21/2022   Bilateral pseudophakia 02/17/2021   Corneal edema of right eye 02/17/2021   Neuralgia 11/07/2018   Sensorineural hearing loss (SNHL) of both ears 10/12/2018   Non-pressure chronic ulcer of other part of left foot limited to breakdown of skin (HCC) 01/05/2017   Acquired claw toe, left 10/07/2016   Hard corn 10/07/2016   Chronic toe pain, left foot 08/25/2016   Meniscal cyst 08/23/2012   Knee pain, bilateral 06/21/2012   Primary osteoarthritis of both knees 06/21/2012  Generalized osteoarthritis of hand 06/21/2012   PCP:  Perley Donnice NOVAK, NP Pharmacy:   [facility] - [place], [place] - [phone] [place] [address] Phone: [phone] Fax: [phone]     Social Drivers of Health (SDOH) Social History: SDOH Screenings   Food Insecurity: No Food Insecurity (01/15/2024)  Housing: Low Risk (01/15/2024)  Transportation Needs: No Transportation Needs (01/15/2024)  Utilities: Not At Risk (01/15/2024)  Depression (PHQ2-9): Low Risk (12/14/2022)  Recent Concern: Depression (PHQ2-9) - High Risk (10/15/2022)  Social Connections: Moderately Isolated (01/15/2024)  Tobacco Use: Low Risk (01/14/2024)   SDOH  Interventions:     Readmission Risk Interventions     No data to display

## 2024-01-16 NOTE — Discharge Summary (Signed)
 Physician Discharge Summary  [name] FMW:[id number] DOB: 1924-03-01 DOA: 01/14/2024  PCP: Perley Donnice NOVAK, NP  Admit date: 01/14/2024 Discharge date: 01/16/2024  Admitted From: ALF Disposition: ALF  Recommendations for Outpatient Follow-up:  Follow up with PCP in 1 week, may need to uptitrate antihypertensives Follow up in ED if recurrence of symptoms   Home Health: No Equipment/Devices: None  Discharge Condition: Stable CODE STATUS: DNR Diet recommendation: Heart healthy  Brief/Interim Summary:  Kristen Pham is a 89 y.o. female with medical history significant of osteoarthritis, essential hypertension, diastolic CHF, depression, peripheral vascular disease who was brought in from ALF with altered mental status.  Patient was seen by staff, found trouble waking her up due to confusion.  She was lethargic.  When EMS arrived patient was arousable.  Patient has had recurrent UTIs.  Daughter came in and said it to get patient to the ER.  Initially a code stroke was called for.  Head CT was negative.  Blood pressure initially was 180/100 in the triage   CBG 162, patient satting 98% on room air, heart rate 60.  Rectal temp was 95.  Received Lawyer.  Workup fairly unremarkable.  Chest x-ray was negative.  CBC, BMP unremarkable.  Chronic anemia.  No leukocytosis.  Patient is admitted for close monitoring with concern for possible sepsis of unclear source.  In the ER she went into A-fib RVR, with no prior known history.  She quickly converted to sinus rhythm after receiving IV Cardizem  and resuming her Coreg .  Patient returned to her baseline mental status and was monitored further prior to discharging back to her facility on 12/28.  We discussed anticoagulation.  She is not a good candidate given multiple falls and short episode of A-fib RVR.  Daughter is agreeable.  Of note, her blood pressure remains above goal.  Per daughter, her normal blood pressure is more than 160s.  She  starts feeling lethargic if blood pressure drops below 140s 150s.  Her blood pressure increased as high as 200 systolic.  We discussed about potentially increasing dose of carvedilol  but the daughter would like to keep as it for now to avoid risk of dropping too low and discuss with her cardiologist  She has chronic wound on left foot which appears stable with no evidence of gross infection.  Blood culture remains negative for 24 hours.   Discharge Diagnoses:  Principal Problem:   Acute metabolic encephalopathy Active Problems:   Essential hypertension   PAD (peripheral artery disease)   Anemia   Chronic toe pain, left foot   Anxiety with depression   Non-pressure chronic ulcer of other part of left foot limited to breakdown of skin (HCC)   Sensorineural hearing loss (SNHL) of both ears   Hypothermia   Sinus bradycardia   Weakness generalized    Discharge Instructions  Discharge Instructions     Call MD for:  extreme fatigue   Complete by: As directed    Call MD for:  persistant dizziness or light-headedness   Complete by: As directed    Call MD for:  persistant nausea and vomiting   Complete by: As directed    Call MD for:  redness, tenderness, or signs of infection (pain, swelling, redness, odor or green/yellow discharge around incision site)   Complete by: As directed    Call MD for:  temperature >100.4   Complete by: As directed    Diet - low sodium heart healthy   Complete by: As directed  Discharge instructions   Complete by: As directed    1.  Continue medications that you have been taking prior to admission 2.  Follow-up with PCP within a week. 3.  Follow-up on blood culture.   Discharge wound care:   Complete by: As directed    Wound care  Daily      Comments: Cleanse toe wound with Vashe [name] # [phone]), paint with betadine, allow to air dry. Cover if desired with foam or bandaid  01/15/24 1610     01/16/24 0500    Wound care  Daily      Comments: Paint  LLE abrasions with betadine, allow to air dry. Open to air   Increase activity slowly   Complete by: As directed       Allergies as of 01/16/2024       Reactions   Ivp Dye [iodinated Contrast Media] Other (See Comments)   Confusion  Disorientation        Medication List     TAKE these medications    acetaminophen  650 MG CR tablet Commonly known as: TYLENOL  Take 650 mg by mouth at bedtime. May take one additional dose during the day not within 4 hours of scheduled dose for pain .   Advil 200 MG tablet Generic drug: ibuprofen Take 200 mg by mouth daily with breakfast.   ibuprofen 200 MG tablet Commonly known as: ADVIL Take 200 mg by mouth every 6 (six) hours as needed for mild pain (pain score 1-3) (knee and shoulder pain).   atorvastatin  20 MG tablet Commonly known as: LIPITOR TAKE 1 TABLET BY MOUTH EVERY DAY   CALCIUM  CITRATE PO Take 1 tablet by mouth daily.   carvedilol  6.25 MG tablet Commonly known as: COREG  Take 1 tablet (6.25 mg total) by mouth 2 (two) times daily.   diclofenac  Sodium 1 % Gel Commonly known as: VOLTAREN  Apply 2 g topically 4 (four) times daily. Apply to right shoulder. What changed:  when to take this reasons to take this additional instructions   docusate sodium  100 MG capsule Commonly known as: COLACE Take 100 mg by mouth 2 (two) times daily as needed for mild constipation.   furosemide  20 MG tablet Commonly known as: LASIX  Take 1 tablet (20 mg total) by mouth daily as needed for fluid or edema.   gabapentin  100 MG capsule Commonly known as: NEURONTIN  Take 100 mg by mouth at bedtime as needed (pain). What changed: Another medication with the same name was changed. Make sure you understand how and when to take each.   gabapentin  100 MG capsule Commonly known as: NEURONTIN  TAKE ONE CAPSULE BY MOUTH TWO OR THREE TIMES DAILY AS DIRECTED What changed: See the new instructions.   hydrALAZINE  25 MG tablet Commonly known as:  APRESOLINE  Take 1 tablet (25 mg total) by mouth 3 (three) times daily as needed (As needed for a systolic over 200.). Recheck blood pressure 1 hour after administration of PRN hydralazine . 1.If systolic BP remains > 200 mmHg at recheck: Repeat hydralazine  dose is permitted after 1 hour of initial dose 2.If systolic BP remains > 200 mmHg after 3 hours from initial PRN hydralazine  administration Call EMS   irbesartan  75 MG tablet Commonly known as: AVAPRO  Take 1 tablet (75 mg total) by mouth 2 (two) times daily.   methotrexate 2.5 MG tablet Commonly known as: RHEUMATREX 4 tablets Orally once weekly at bedtime for psoriatic arthritis; Duration: 90 days   mirtazapine  15 MG tablet Commonly known as: REMERON   Take 15 mg by mouth at bedtime.   naproxen  sodium 220 MG tablet Commonly known as: ALEVE  Take 220 mg by mouth 2 (two) times daily as needed (pain).   NONFORMULARY OR COMPOUNDED ITEM Apply 1 Application topically at bedtime as needed (Pain in big toe).   traMADol  50 MG tablet Commonly known as: ULTRAM  Take 50 mg by mouth 3 (three) times daily as needed.               Discharge Care Instructions  (From admission, onward)           Start     Ordered   01/16/24 0000  Discharge wound care:       Comments: Wound care  Daily      Comments: Cleanse toe wound with Vashe [name] # [phone]), paint with betadine, allow to air dry. Cover if desired with foam or bandaid  01/15/24 1610     01/16/24 0500    Wound care  Daily      Comments: Paint LLE abrasions with betadine, allow to air dry. Open to air   01/16/24 1018            Follow-up Information     Perley Donnice NOVAK, NP. Schedule an appointment as soon as possible for a visit in 1 week(s).   Specialty: Nurse Practitioner Why: HTN management Contact information: [address] [place] [address] [phone]                Allergies[1]  Consultations:    Procedures/Studies: CT Head Wo  Contrast Result Date: 01/14/2024 EXAM: CT HEAD WITHOUT CONTRAST 01/14/2024 03:55:22 PM TECHNIQUE: CT of the head was performed without the administration of intravenous contrast. Automated exposure control, iterative reconstruction, and/or weight based adjustment of the mA/kV was utilized to reduce the radiation dose to as low as reasonably achievable. COMPARISON: 05/04/2023 CLINICAL HISTORY: Mental status change, unknown cause FINDINGS: BRAIN AND VENTRICLES: No acute hemorrhage. No evidence of acute infarct. No hydrocephalus. No extra-axial collection. No mass effect or midline shift. Moderate chronic microvascular ischemic change. Bilateral basal ganglia mineralization. Generalized volume loss. ORBITS: Bilateral lens replacement. SINUSES: Chronic right maxillary sinusitis with mucosal thickening and osseous changes. Sphenoid sinus mucosal thickening. SOFT TISSUES AND SKULL: No acute soft tissue abnormality. No skull fracture. IMPRESSION: 1. No acute intracranial abnormality. 2. Moderate chronic microvascular ischemic change and generalized volume loss. Electronically signed by: Donnice Perley MD 01/14/2024 04:01 PM EST RP Workstation: [id number]   DG Chest Port 1 View Result Date: 01/14/2024 CLINICAL DATA:  Weakness EXAM: PORTABLE CHEST 1 VIEW COMPARISON:  May 04, 2023 FINDINGS: The heart size and mediastinal contours are within normal limits. Both lungs are clear. Severe degenerative changes seen involving the right glenohumeral joint. IMPRESSION: No active disease. Electronically Signed   By: Lynwood Landy Raddle M.D.   On: 01/14/2024 13:45   VAS US  ABI WITH/WO TBI Result Date: 01/05/2024  LOWER EXTREMITY DOPPLER STUDY Patient Name:  ELIANNE GUBSER  Date of Exam:   01/05/2024 Medical Rec #: [id number]          Accession #:    [id number] Date of Birth: 16-Mar-1924           Patient Gender: F Patient Age:   38 years Exam Location:  [address] Procedure:      VAS US  ABI WITH/WO TBI Referring Phys: EMMA  COLLINS --------------------------------------------------------------------------------  Indications: Peripheral artery disease. history of chronic right foot  osteomyelitis. She was last seen on 10/06/23 for debridement of              painful hyperkeratotic tissue. She has now developed a second left              toe ulcer between the first and second toe. There is mild              cellulitis at the base of the second toe. High Risk Factors: Hypertension, no history of smoking.  Performing Technologist: [place] RVT  Examination Guidelines: A complete evaluation includes at minimum, Doppler waveform signals and systolic blood pressure reading at the level of bilateral brachial, anterior tibial, and posterior tibial arteries, when vessel segments are accessible. Bilateral testing is considered an integral part of a complete examination. Photoelectric Plethysmograph (PPG) waveforms and toe systolic pressure readings are included as required and additional duplex testing as needed. Limited examinations for reoccurring indications may be performed as noted.  ABI Findings: +---------+------------------+-----+----------+--------+ Right    Rt Pressure (mmHg)IndexWaveform  Comment  +---------+------------------+-----+----------+--------+ Brachial 203                                       +---------+------------------+-----+----------+--------+ ATA      144               0.70 monophasic         +---------+------------------+-----+----------+--------+ PTA      102               0.50 monophasic         +---------+------------------+-----+----------+--------+ Great Toe106               0.51                    +---------+------------------+-----+----------+--------+ +---------+------------------+-----+----------+----------+ Left     Lt Pressure (mmHg)IndexWaveform  Comment    +---------+------------------+-----+----------+----------+ Brachial 206                                          +---------+------------------+-----+----------+----------+ ATA      109               0.53 monophasic           +---------+------------------+-----+----------+----------+ PTA      115               0.56 monophasic           +---------+------------------+-----+----------+----------+ Great Toe                       present   ulceration +---------+------------------+-----+----------+----------+ +-------+-----------+-----------+------------+------------+ ABI/TBIToday's ABIToday's TBIPrevious ABIPrevious TBI +-------+-----------+-----------+------------+------------+ Right  0.7        0.51                                +-------+-----------+-----------+------------+------------+ Left   0.56       ulceration                          +-------+-----------+-----------+------------+------------+  No recent ABI.  Summary: Right: Resting right ankle-brachial index indicates moderate right lower extremity arterial disease. The right toe-brachial index is abnormal. Right toe pressure is >60 mmHg which suggests adequate perfusion  for healing.  Left: Resting left ankle-brachial index indicates moderate left lower extremity arterial disease. Unable to obtain pressure due to ulcerations.  *See table(s) above for measurements and observations.  Electronically signed by Penne Colorado MD on 01/05/2024 at 4:27:41 PM.    Final       Subjective:   Discharge Exam: Vitals:   01/16/24 0644 01/16/24 0741  BP: (!) 161/73 (!) 195/96  Pulse: 63 (!) 54  Resp: 16 14  Temp: 98.3 F (36.8 C) (!) 97.5 F (36.4 C)  SpO2: 97% 96%    General: Alert, at baseline per daughter Chest: Clear CVs: S1, S2, + murmur, regular rhythm Abdomen: Soft, nontender Extremities: No edema, small ulcer on the left foot between second and great toe, noninfected  The results of significant diagnostics from this hospitalization (including imaging, microbiology, ancillary and laboratory) are listed  below for reference.     Microbiology: Recent Results (from the past 240 hours)  Culture, blood (routine x 2)     Status: None (Preliminary result)   Collection Time: 01/14/24 12:34 PM   Specimen: BLOOD RIGHT ARM  Result Value Ref Range Status   Specimen Description BLOOD RIGHT ARM  Final   Special Requests   Final    BOTTLES DRAWN AEROBIC AND ANAEROBIC Blood Culture adequate volume   Culture   Final    NO GROWTH 2 DAYS Performed at [facility], [address], [place], [place] [address]    Report Status PENDING  Incomplete  Resp panel by RT-PCR (RSV, Flu A&B, Covid)     Status: None   Collection Time: 01/14/24 12:34 PM   Specimen: Nasal Swab  Result Value Ref Range Status   SARS Coronavirus 2 by RT PCR NEGATIVE NEGATIVE Final   Influenza A by PCR NEGATIVE NEGATIVE Final   Influenza B by PCR NEGATIVE NEGATIVE Final    Comment: (NOTE) The Xpert Xpress SARS-CoV-2/FLU/RSV plus assay is intended as an aid in the diagnosis of influenza from Nasopharyngeal swab specimens and should not be used as a sole basis for treatment. Nasal washings and aspirates are unacceptable for Xpert Xpress SARS-CoV-2/FLU/RSV testing.  Fact Sheet for Patients: bloggercourse.com  Fact Sheet for Healthcare Providers: seriousbroker.it  This test is not yet approved or cleared by the [place]  FDA and has been authorized for detection and/or diagnosis of SARS-CoV-2 by FDA under an Emergency Use Authorization (EUA). This EUA will remain in effect (meaning this test can be used) for the duration of the COVID-19 declaration under Section 564(b)(1) of the Act, 21 U.S.C. section 360bbb-3(b)(1), unless the authorization is terminated or revoked.     Resp Syncytial Virus by PCR NEGATIVE NEGATIVE Final    Comment: (NOTE) Fact Sheet for Patients: bloggercourse.com  Fact Sheet for Healthcare  Providers: seriousbroker.it  This test is not yet approved or cleared by the [place]  FDA and has been authorized for detection and/or diagnosis of SARS-CoV-2 by FDA under an Emergency Use Authorization (EUA). This EUA will remain in effect (meaning this test can be used) for the duration of the COVID-19 declaration under Section 564(b)(1) of the Act, 21 U.S.C. section 360bbb-3(b)(1), unless the authorization is terminated or revoked.  Performed at [facility], [address], [place], [place] [address]      Labs: BNP (last 3 results) No results for input(s): BNP in the last 8760 hours. Basic Metabolic Panel: Recent Labs  Lab 01/14/24 1234 01/14/24 2036 01/15/24 0530  NA 139  --  141  K 4.2  --  3.8  CL 103  --  107  CO2 26  --  22  GLUCOSE 112*  --  73  BUN 25*  --  19  CREATININE 1.28* 0.99 0.90  CALCIUM  9.2  --  8.6*   Liver Function Tests: Recent Labs  Lab 01/14/24 1234 01/15/24 0530  AST 24 24  ALT 17 14  ALKPHOS 117 110  BILITOT 0.5 0.7  PROT 7.2 6.7  ALBUMIN 3.8 3.4*   No results for input(s): LIPASE, AMYLASE in the last 168 hours. Recent Labs  Lab 01/14/24 1715  AMMONIA 19   CBC: Recent Labs  Lab 01/14/24 1234 01/14/24 2036 01/15/24 0530  WBC 5.9 6.0 7.6  NEUTROABS 3.6  --   --   HGB 11.7* 10.5* 11.2*  HCT 37.3 32.2* 34.5*  MCV 94.9 91.7 91.5  PLT 239 223 218   Cardiac Enzymes: No results for input(s): CKTOTAL, CKMB, CKMBINDEX, TROPONINI in the last 168 hours. BNP: Invalid input(s): POCBNP CBG: Recent Labs  Lab 01/14/24 1713 01/15/24 1335  GLUCAP 71 141*   D-Dimer No results for input(s): DDIMER in the last 72 hours. Hgb A1c No results for input(s): HGBA1C in the last 72 hours. Lipid Profile No results for input(s): CHOL, HDL, LDLCALC, TRIG, CHOLHDL, LDLDIRECT in the last 72 hours. Thyroid  function studies Recent Labs    01/14/24 1715  TSH 1.220   Anemia  work up No results for input(s): VITAMINB12, FOLATE, FERRITIN, TIBC, IRON , RETICCTPCT in the last 72 hours. Urinalysis    Component Value Date/Time   COLORURINE YELLOW 01/14/2024 1602   APPEARANCEUR CLEAR 01/14/2024 1602   LABSPEC 1.008 01/14/2024 1602   PHURINE 6.0 01/14/2024 1602   GLUCOSEU NEGATIVE 01/14/2024 1602   HGBUR NEGATIVE 01/14/2024 1602   BILIRUBINUR NEGATIVE 01/14/2024 1602   KETONESUR NEGATIVE 01/14/2024 1602   PROTEINUR NEGATIVE 01/14/2024 1602   UROBILINOGEN 0.2 07/24/2009 2030   NITRITE NEGATIVE 01/14/2024 1602   LEUKOCYTESUR NEGATIVE 01/14/2024 1602   Sepsis Labs Recent Labs  Lab 01/14/24 1234 01/14/24 2036 01/15/24 0530  WBC 5.9 6.0 7.6   Microbiology Recent Results (from the past 240 hours)  Culture, blood (routine x 2)     Status: None (Preliminary result)   Collection Time: 01/14/24 12:34 PM   Specimen: BLOOD RIGHT ARM  Result Value Ref Range Status   Specimen Description BLOOD RIGHT ARM  Final   Special Requests   Final    BOTTLES DRAWN AEROBIC AND ANAEROBIC Blood Culture adequate volume   Culture   Final    NO GROWTH 2 DAYS Performed at [facility], [address], [place], [place] [address]    Report Status PENDING  Incomplete  Resp panel by RT-PCR (RSV, Flu A&B, Covid)     Status: None   Collection Time: 01/14/24 12:34 PM   Specimen: Nasal Swab  Result Value Ref Range Status   SARS Coronavirus 2 by RT PCR NEGATIVE NEGATIVE Final   Influenza A by PCR NEGATIVE NEGATIVE Final   Influenza B by PCR NEGATIVE NEGATIVE Final    Comment: (NOTE) The Xpert Xpress SARS-CoV-2/FLU/RSV plus assay is intended as an aid in the diagnosis of influenza from Nasopharyngeal swab specimens and should not be used as a sole basis for treatment. Nasal washings and aspirates are unacceptable for Xpert Xpress SARS-CoV-2/FLU/RSV testing.  Fact Sheet for Patients: bloggercourse.com  Fact Sheet for Healthcare  Providers: seriousbroker.it  This test is not yet approved or cleared by the [place]  [place] FDA and has been authorized for detection and/or diagnosis of SARS-CoV-2 by FDA under an Emergency Use Authorization (EUA). This EUA will remain in effect (meaning this test can be used) for the duration of the COVID-19 declaration under Section 564(b)(1) of the Act, 21 U.S.C. section 360bbb-3(b)(1), unless the authorization is terminated or revoked.     Resp Syncytial Virus by PCR NEGATIVE NEGATIVE Final    Comment: (NOTE) Fact Sheet for Patients: bloggercourse.com  Fact Sheet for Healthcare Providers: seriousbroker.it  This test is not yet approved or cleared by the [place]  FDA and has been authorized for detection and/or diagnosis of SARS-CoV-2 by FDA under an Emergency Use Authorization (EUA). This EUA will remain in effect (meaning this test can be used) for the duration of the COVID-19 declaration under Section 564(b)(1) of the Act, 21 U.S.C. section 360bbb-3(b)(1), unless the authorization is terminated or revoked.  Performed at [facility], [address] [place], [place] [address]      Time coordinating discharge: 35 minutes  SIGNED:   Derryl Duval, MD  [facility] 01/16/2024, 10:29 AM      [1]  Allergies Allergen Reactions   Ivp Dye [Iodinated Contrast Media] Other (See Comments)    Confusion  Disorientation

## 2024-01-16 NOTE — Progress Notes (Signed)
" °   01/15/24 2350  Assess: MEWS Score  Temp 98.4 F (36.9 C)  BP (!) 219/93  MAP (mmHg) 126  Pulse Rate 87  ECG Heart Rate 87  Resp 16  SpO2 95 %  O2 Device Room Air  Assess: MEWS Score  MEWS Temp 0  MEWS Systolic 2  MEWS Pulse 0  MEWS RR 0  MEWS LOC 0  MEWS Score 2  MEWS Score Color Yellow  Assess: if the MEWS score is Yellow or Red  Were vital signs accurate and taken at a resting state? Yes  Does the patient meet 2 or more of the SIRS criteria? No  MEWS guidelines implemented  Yes, yellow  Treat  MEWS Interventions Considered administering scheduled or prn medications/treatments as ordered  Take Vital Signs  Increase Vital Sign Frequency  Yellow: Q2hr x1, continue Q4hrs until patient remains green for 12hrs  Escalate  MEWS: Escalate Yellow: Discuss with charge nurse and consider notifying provider and/or RRT  Notify: Charge Nurse/RN  Name of Charge Nurse/RN Notified [place], RN  Provider Notification  Provider Name/Title Dr VEAR. [place]  Date Provider Notified 01/15/24  Time Provider Notified 2344  Method of Notification Page (secured chat)  Notification Reason Other (Comment) (Patient is restless and confused. Blood pressures are elevated. Alert and oriented to self. Requesting for medication to help patient calm down.)  Provider response See new orders (Haldol  ordered.)  Date of Provider Response 01/16/24  Time of Provider Response 2359  Assess: SIRS CRITERIA  SIRS Temperature  0  SIRS Respirations  0  SIRS Pulse 0  SIRS WBC 0  SIRS Score Sum  0    "

## 2024-01-16 NOTE — TOC Transition Note (Signed)
 Transition of Care [facility]) - Discharge Note   Patient Details  Name: Kristen Pham MRN: [id number] Date of Birth: April 10, 1924  [facility] CM/SW Contact:  Olam FORBES Ally, LCSW Phone Number: 01/16/2024, 12:45 PM   Clinical Narrative:      Patient will DC to:?[facility] Anticipated DC date:?01/16/2024 Family notified:?Cheryl Transport by: Deanie Rosella   Per MD patient ready for DC to [facility]. RN, patient, patient's family, and facility notified of DC. Discharge Summary sent to facility. RN given number for report [phone].   DC packet on chart. Daughter will transport requested for patient.  CSW signing off.   Olam Ally, [place] [phone]        Patient Goals and CMS Choice            Discharge Placement                       Discharge Plan and Services Additional resources added to the After Visit Summary for                                       Social Drivers of Health (SDOH) Interventions SDOH Screenings   Food Insecurity: No Food Insecurity (01/15/2024)  Housing: Low Risk (01/15/2024)  Transportation Needs: No Transportation Needs (01/15/2024)  Utilities: Not At Risk (01/15/2024)  Depression (PHQ2-9): Low Risk (12/14/2022)  Recent Concern: Depression (PHQ2-9) - High Risk (10/15/2022)  Social Connections: Moderately Isolated (01/15/2024)  Tobacco Use: Low Risk (01/14/2024)     Readmission Risk Interventions     No data to display

## 2024-01-16 NOTE — Evaluation (Signed)
 Occupational Therapy Evaluation Patient Details Name: Kristen Pham MRN: [id number] DOB: 1924-07-22 Today's Date: 01/16/2024   History of Present Illness   89 y/o F presenting to ED from [facility] on 12/26 with AMS; admitted for acute metabolic enceohalopathy, head CT negative. Also noted to have transient bradycardia and new A fib dx.    PMH includes OA, essential HTN, CHF, depression, PVD, carpal tunnel syndrome     Clinical Impressions Pt from ALF, having assist for ADLs and typically ambulating short distances with RW, occasionally ambulates hall distance to dining area for meals. Pt currently needs min-max A for ADLs, mod +2 for bed mobility and and mod-max +2 for standing and taking L lateral side steps. Pt with difficulty weight shifting on R LE during standing transfers. Pt presenting with impairments listed below, will follow acutely. Recommend HHOT at d/c given ALF staff can provide level of assist needed.     If plan is discharge home, recommend the following:   Two people to help with walking and/or transfers;A lot of help with bathing/dressing/bathroom;Assistance with cooking/housework;Direct supervision/assist for medications management;Direct supervision/assist for financial management;Assist for transportation;Help with stairs or ramp for entrance     Functional Status Assessment   Patient has had a recent decline in their functional status and demonstrates the ability to make significant improvements in function in a reasonable and predictable amount of time.     Equipment Recommendations   None recommended by OT     Recommendations for Other Services   PT consult     Precautions/Restrictions   Precautions Precautions: Fall Restrictions Weight Bearing Restrictions Per Provider Order: No     Mobility Bed Mobility Overal bed mobility: Needs Assistance Bed Mobility: Supine to Sit, Sit to Supine     Supine to sit: Mod assist Sit to supine:  Mod assist, +2 for physical assistance        Transfers Overall transfer level: Needs assistance Equipment used: Rolling walker (2 wheels) Transfers: Sit to/from Stand Sit to Stand: Mod assist, Max assist, +2 physical assistance           General transfer comment: leaning to L      Balance Overall balance assessment: Needs assistance Sitting-balance support: Feet supported Sitting balance-Leahy Scale: Fair   Postural control: Posterior lean, Left lateral lean                                 ADL either performed or assessed with clinical judgement   ADL Overall ADL's : Needs assistance/impaired Eating/Feeding: Set up   Grooming: Set up   Upper Body Bathing: Minimal assistance   Lower Body Bathing: Moderate assistance   Upper Body Dressing : Minimal assistance   Lower Body Dressing: Moderate assistance   Toilet Transfer: +2 for physical assistance;Maximal assistance   Toileting- Clothing Manipulation and Hygiene: Maximal assistance       Functional mobility during ADLs: Maximal assistance;+2 for physical assistance;Moderate assistance       Vision   Vision Assessment?: No apparent visual deficits     Perception Perception: Not tested       Praxis Praxis: Not tested       Pertinent Vitals/Pain Pain Assessment Pain Assessment: Faces Pain Score: 4  Faces Pain Scale: Hurts little more Pain Location: R foot Pain Descriptors / Indicators: Discomfort Pain Intervention(s): Limited activity within patient's tolerance, Monitored during session, Repositioned     Extremity/Trunk Assessment Upper Extremity Assessment Upper  Extremity Assessment: Generalized weakness   Lower Extremity Assessment Lower Extremity Assessment: Defer to PT evaluation   Cervical / Trunk Assessment Cervical / Trunk Assessment: Kyphotic   Communication Communication Communication: No apparent difficulties   Cognition Arousal: Alert Behavior During Therapy:  WFL for tasks assessed/performed Cognition: No apparent impairments             OT - Cognition Comments: not formally assessed, but following commands appropriately                 Following commands: Intact       Cueing  General Comments   Cueing Techniques: Verbal cues  VSS on RA   Exercises     Shoulder Instructions      Home Living Family/patient expects to be discharged to:: Assisted living                                 Additional Comments: gets PT/OT at [facility]      Prior Functioning/Environment               Mobility Comments: RW for mobility, ambulates to dining hall 3-4 meals/week, takes w/c depending on how her foot feels ADLs Comments: assist for bathing, dressing, pericare    OT Problem List: Decreased strength;Decreased range of motion;Decreased activity tolerance;Impaired balance (sitting and/or standing);Decreased coordination;Decreased safety awareness;Cardiopulmonary status limiting activity   OT Treatment/Interventions: Self-care/ADL training;Therapeutic exercise;Energy conservation;DME and/or AE instruction;Therapeutic activities;Patient/family education;Balance training      OT Goals(Current goals can be found in the care plan section)   Acute Rehab OT Goals Patient Stated Goal: none stated OT Goal Formulation: With patient/family Time For Goal Achievement: 01/30/24 Potential to Achieve Goals: Good ADL Goals Pt Will Perform Upper Body Dressing: with supervision;sitting Pt Will Perform Lower Body Dressing: with supervision;sitting/lateral leans;sit to/from stand Pt Will Transfer to Toilet: with supervision;stand pivot transfer;squat pivot transfer;bedside commode Pt Will Perform Tub/Shower Transfer: Tub transfer;Shower transfer;shower seat;Stand pivot transfer;Squat pivot transfer   OT Frequency:  Min 2X/week    Co-evaluation PT/OT/SLP Co-Evaluation/Treatment: Yes Reason for Co-Treatment: Complexity of  the patient's impairments (multi-system involvement);Necessary to address cognition/behavior during functional activity;To address functional/ADL transfers   OT goals addressed during session: ADL's and self-care;Strengthening/ROM      AM-PAC OT 6 Clicks Daily Activity     Outcome Measure Help from another person eating meals?: A Little Help from another person taking care of personal grooming?: A Little Help from another person toileting, which includes using toliet, bedpan, or urinal?: A Lot Help from another person bathing (including washing, rinsing, drying)?: A Lot Help from another person to put on and taking off regular upper body clothing?: A Lot Help from another person to put on and taking off regular lower body clothing?: A Lot 6 Click Score: 14   End of Session Equipment Utilized During Treatment: Gait belt;Rolling walker (2 wheels) Nurse Communication: Mobility status  Activity Tolerance: Patient tolerated treatment well Patient left: in bed;with call bell/phone within reach;with bed alarm set  OT Visit Diagnosis: Unsteadiness on feet (R26.81);Other abnormalities of gait and mobility (R26.89);Muscle weakness (generalized) (M62.81);History of falling (Z91.81);Repeated falls (R29.6)                Time: 8964-8945 OT Time Calculation (min): 19 min Charges:  OT General Charges $OT Visit: 1 Visit OT Evaluation $OT Eval Moderate Complexity: 1 Mod  Savan Ruta K, OTD, OTR/L [employer] Preferred Acute Rehab [phone]  Mirel Hundal K Koonce 01/16/2024, 11:11 AM

## 2024-01-16 NOTE — Plan of Care (Signed)
" °  Problem: Clinical Measurements: Goal: Respiratory complications will improve Outcome: Progressing Goal: Cardiovascular complication will be avoided Outcome: Progressing   Problem: Coping: Goal: Level of anxiety will decrease Outcome: Progressing   Problem: Elimination: Goal: Will not experience complications related to urinary retention Outcome: Progressing   Problem: Safety: Goal: Ability to remain free from injury will improve Outcome: Progressing   Problem: Cardiac: Goal: Ability to achieve and maintain adequate cardiopulmonary perfusion will improve Outcome: Progressing   "

## 2024-01-19 LAB — CULTURE, BLOOD (ROUTINE X 2)
Culture: NO GROWTH
Special Requests: ADEQUATE

## 2024-01-25 ENCOUNTER — Other Ambulatory Visit: Payer: Self-pay | Admitting: Physician Assistant

## 2024-02-02 ENCOUNTER — Ambulatory Visit: Admitting: Vascular Surgery

## 2024-02-02 ENCOUNTER — Ambulatory Visit: Admitting: Family
